# Patient Record
Sex: Female | Born: 1985 | Race: White | Hispanic: No | Marital: Married | Smoking: Never smoker
Health system: Southern US, Community
[De-identification: ages and names within clinical notes are randomized; demographics above are authoritative.]

## PROBLEM LIST (undated history)

## (undated) DIAGNOSIS — G43909 Migraine, unspecified, not intractable, without status migrainosus: Secondary | ICD-10-CM

## (undated) DIAGNOSIS — K623 Rectal prolapse: Secondary | ICD-10-CM

## (undated) DIAGNOSIS — F419 Anxiety disorder, unspecified: Secondary | ICD-10-CM

## (undated) DIAGNOSIS — F429 Obsessive-compulsive disorder, unspecified: Secondary | ICD-10-CM

## (undated) DIAGNOSIS — G510 Bell's palsy: Secondary | ICD-10-CM

## (undated) DIAGNOSIS — J189 Pneumonia, unspecified organism: Secondary | ICD-10-CM

## (undated) DIAGNOSIS — D649 Anemia, unspecified: Secondary | ICD-10-CM

## (undated) HISTORY — DX: Anemia, unspecified: D64.9

## (undated) HISTORY — DX: Rectal prolapse: K62.3

## (undated) HISTORY — DX: Migraine, unspecified, not intractable, without status migrainosus: G43.909

## (undated) HISTORY — DX: Obsessive-compulsive disorder, unspecified: F42.9

## (undated) HISTORY — DX: Pneumonia, unspecified organism: J18.9

## (undated) HISTORY — PX: BUNIONECTOMY: SHX129

## (undated) HISTORY — DX: Bell's palsy: G51.0

## (undated) HISTORY — DX: Anxiety disorder, unspecified: F41.9

## (undated) HISTORY — PX: NO PAST SURGERIES: SHX2092

---

## 2013-03-20 LAB — HM PAP SMEAR

## 2013-04-18 DIAGNOSIS — Z0000 Encounter for general adult medical examination without abnormal findings: Secondary | ICD-10-CM | POA: Insufficient documentation

## 2013-04-18 DIAGNOSIS — D509 Iron deficiency anemia, unspecified: Secondary | ICD-10-CM | POA: Insufficient documentation

## 2014-07-24 DIAGNOSIS — K623 Rectal prolapse: Secondary | ICD-10-CM | POA: Insufficient documentation

## 2016-04-27 DIAGNOSIS — M9901 Segmental and somatic dysfunction of cervical region: Secondary | ICD-10-CM | POA: Diagnosis not present

## 2016-04-27 DIAGNOSIS — M542 Cervicalgia: Secondary | ICD-10-CM | POA: Diagnosis not present

## 2016-04-27 DIAGNOSIS — M4005 Postural kyphosis, thoracolumbar region: Secondary | ICD-10-CM | POA: Diagnosis not present

## 2016-04-27 DIAGNOSIS — M9902 Segmental and somatic dysfunction of thoracic region: Secondary | ICD-10-CM | POA: Diagnosis not present

## 2016-04-29 DIAGNOSIS — F411 Generalized anxiety disorder: Secondary | ICD-10-CM | POA: Diagnosis not present

## 2016-05-09 DIAGNOSIS — M9901 Segmental and somatic dysfunction of cervical region: Secondary | ICD-10-CM | POA: Diagnosis not present

## 2016-05-09 DIAGNOSIS — M542 Cervicalgia: Secondary | ICD-10-CM | POA: Diagnosis not present

## 2016-05-09 DIAGNOSIS — M9902 Segmental and somatic dysfunction of thoracic region: Secondary | ICD-10-CM | POA: Diagnosis not present

## 2016-05-09 DIAGNOSIS — M4005 Postural kyphosis, thoracolumbar region: Secondary | ICD-10-CM | POA: Diagnosis not present

## 2016-05-10 DIAGNOSIS — F411 Generalized anxiety disorder: Secondary | ICD-10-CM | POA: Diagnosis not present

## 2016-05-10 DIAGNOSIS — M542 Cervicalgia: Secondary | ICD-10-CM | POA: Diagnosis not present

## 2016-05-10 DIAGNOSIS — M4005 Postural kyphosis, thoracolumbar region: Secondary | ICD-10-CM | POA: Diagnosis not present

## 2016-05-10 DIAGNOSIS — M9901 Segmental and somatic dysfunction of cervical region: Secondary | ICD-10-CM | POA: Diagnosis not present

## 2016-05-10 DIAGNOSIS — M9902 Segmental and somatic dysfunction of thoracic region: Secondary | ICD-10-CM | POA: Diagnosis not present

## 2016-05-12 DIAGNOSIS — M9902 Segmental and somatic dysfunction of thoracic region: Secondary | ICD-10-CM | POA: Diagnosis not present

## 2016-05-12 DIAGNOSIS — M4005 Postural kyphosis, thoracolumbar region: Secondary | ICD-10-CM | POA: Diagnosis not present

## 2016-05-12 DIAGNOSIS — M9901 Segmental and somatic dysfunction of cervical region: Secondary | ICD-10-CM | POA: Diagnosis not present

## 2016-05-12 DIAGNOSIS — M542 Cervicalgia: Secondary | ICD-10-CM | POA: Diagnosis not present

## 2016-05-17 DIAGNOSIS — M4005 Postural kyphosis, thoracolumbar region: Secondary | ICD-10-CM | POA: Diagnosis not present

## 2016-05-17 DIAGNOSIS — M9901 Segmental and somatic dysfunction of cervical region: Secondary | ICD-10-CM | POA: Diagnosis not present

## 2016-05-17 DIAGNOSIS — M9902 Segmental and somatic dysfunction of thoracic region: Secondary | ICD-10-CM | POA: Diagnosis not present

## 2016-05-17 DIAGNOSIS — M542 Cervicalgia: Secondary | ICD-10-CM | POA: Diagnosis not present

## 2016-05-18 DIAGNOSIS — M542 Cervicalgia: Secondary | ICD-10-CM | POA: Diagnosis not present

## 2016-05-18 DIAGNOSIS — M9901 Segmental and somatic dysfunction of cervical region: Secondary | ICD-10-CM | POA: Diagnosis not present

## 2016-05-18 DIAGNOSIS — M4005 Postural kyphosis, thoracolumbar region: Secondary | ICD-10-CM | POA: Diagnosis not present

## 2016-05-18 DIAGNOSIS — M9902 Segmental and somatic dysfunction of thoracic region: Secondary | ICD-10-CM | POA: Diagnosis not present

## 2016-05-19 DIAGNOSIS — M9902 Segmental and somatic dysfunction of thoracic region: Secondary | ICD-10-CM | POA: Diagnosis not present

## 2016-05-19 DIAGNOSIS — M9901 Segmental and somatic dysfunction of cervical region: Secondary | ICD-10-CM | POA: Diagnosis not present

## 2016-05-19 DIAGNOSIS — M542 Cervicalgia: Secondary | ICD-10-CM | POA: Diagnosis not present

## 2016-05-19 DIAGNOSIS — M4005 Postural kyphosis, thoracolumbar region: Secondary | ICD-10-CM | POA: Diagnosis not present

## 2016-05-22 DIAGNOSIS — R202 Paresthesia of skin: Secondary | ICD-10-CM | POA: Diagnosis not present

## 2016-05-22 DIAGNOSIS — F202 Catatonic schizophrenia: Secondary | ICD-10-CM | POA: Diagnosis not present

## 2016-05-22 DIAGNOSIS — G9389 Other specified disorders of brain: Secondary | ICD-10-CM | POA: Diagnosis not present

## 2016-05-22 DIAGNOSIS — G510 Bell's palsy: Secondary | ICD-10-CM | POA: Diagnosis not present

## 2016-05-22 DIAGNOSIS — D649 Anemia, unspecified: Secondary | ICD-10-CM | POA: Diagnosis not present

## 2016-05-22 LAB — HM HIV SCREENING LAB: HM HIV Screening: NEGATIVE

## 2016-05-23 DIAGNOSIS — F411 Generalized anxiety disorder: Secondary | ICD-10-CM | POA: Diagnosis not present

## 2016-05-26 DIAGNOSIS — B2701 Gammaherpesviral mononucleosis with polyneuropathy: Secondary | ICD-10-CM | POA: Diagnosis not present

## 2016-05-26 DIAGNOSIS — G510 Bell's palsy: Secondary | ICD-10-CM | POA: Diagnosis not present

## 2016-05-31 DIAGNOSIS — F419 Anxiety disorder, unspecified: Secondary | ICD-10-CM | POA: Diagnosis not present

## 2016-05-31 DIAGNOSIS — G510 Bell's palsy: Secondary | ICD-10-CM | POA: Diagnosis not present

## 2016-06-10 DIAGNOSIS — G510 Bell's palsy: Secondary | ICD-10-CM | POA: Diagnosis not present

## 2016-06-28 DIAGNOSIS — F411 Generalized anxiety disorder: Secondary | ICD-10-CM | POA: Diagnosis not present

## 2016-07-05 DIAGNOSIS — F411 Generalized anxiety disorder: Secondary | ICD-10-CM | POA: Diagnosis not present

## 2016-07-12 DIAGNOSIS — F411 Generalized anxiety disorder: Secondary | ICD-10-CM | POA: Diagnosis not present

## 2016-07-19 DIAGNOSIS — F411 Generalized anxiety disorder: Secondary | ICD-10-CM | POA: Diagnosis not present

## 2016-08-09 DIAGNOSIS — F411 Generalized anxiety disorder: Secondary | ICD-10-CM | POA: Diagnosis not present

## 2016-08-16 DIAGNOSIS — F411 Generalized anxiety disorder: Secondary | ICD-10-CM | POA: Diagnosis not present

## 2016-08-23 DIAGNOSIS — F411 Generalized anxiety disorder: Secondary | ICD-10-CM | POA: Diagnosis not present

## 2016-10-03 DIAGNOSIS — G510 Bell's palsy: Secondary | ICD-10-CM | POA: Diagnosis not present

## 2016-10-26 DIAGNOSIS — B0221 Postherpetic geniculate ganglionitis: Secondary | ICD-10-CM | POA: Insufficient documentation

## 2016-10-26 DIAGNOSIS — Z0000 Encounter for general adult medical examination without abnormal findings: Secondary | ICD-10-CM | POA: Diagnosis not present

## 2016-10-26 DIAGNOSIS — Z6825 Body mass index (BMI) 25.0-25.9, adult: Secondary | ICD-10-CM | POA: Diagnosis not present

## 2016-10-26 DIAGNOSIS — R5383 Other fatigue: Secondary | ICD-10-CM | POA: Diagnosis not present

## 2016-10-26 DIAGNOSIS — D509 Iron deficiency anemia, unspecified: Secondary | ICD-10-CM | POA: Diagnosis not present

## 2016-11-07 DIAGNOSIS — M542 Cervicalgia: Secondary | ICD-10-CM | POA: Diagnosis not present

## 2017-01-10 DIAGNOSIS — F422 Mixed obsessional thoughts and acts: Secondary | ICD-10-CM | POA: Diagnosis not present

## 2017-01-19 DIAGNOSIS — H04123 Dry eye syndrome of bilateral lacrimal glands: Secondary | ICD-10-CM | POA: Diagnosis not present

## 2017-01-24 DIAGNOSIS — H5319 Other subjective visual disturbances: Secondary | ICD-10-CM | POA: Diagnosis not present

## 2017-01-25 DIAGNOSIS — H53149 Visual discomfort, unspecified: Secondary | ICD-10-CM | POA: Diagnosis not present

## 2017-01-25 DIAGNOSIS — H5319 Other subjective visual disturbances: Secondary | ICD-10-CM | POA: Diagnosis not present

## 2017-01-25 DIAGNOSIS — H539 Unspecified visual disturbance: Secondary | ICD-10-CM | POA: Diagnosis not present

## 2017-01-26 DIAGNOSIS — H5340 Unspecified visual field defects: Secondary | ICD-10-CM | POA: Diagnosis not present

## 2017-01-26 DIAGNOSIS — G43119 Migraine with aura, intractable, without status migrainosus: Secondary | ICD-10-CM | POA: Diagnosis not present

## 2017-01-27 DIAGNOSIS — G35 Multiple sclerosis: Secondary | ICD-10-CM | POA: Diagnosis not present

## 2017-01-27 DIAGNOSIS — H5340 Unspecified visual field defects: Secondary | ICD-10-CM | POA: Diagnosis not present

## 2017-01-27 DIAGNOSIS — H547 Unspecified visual loss: Secondary | ICD-10-CM | POA: Diagnosis not present

## 2017-01-27 DIAGNOSIS — R253 Fasciculation: Secondary | ICD-10-CM | POA: Diagnosis not present

## 2017-01-27 DIAGNOSIS — G43119 Migraine with aura, intractable, without status migrainosus: Secondary | ICD-10-CM | POA: Diagnosis not present

## 2017-01-27 DIAGNOSIS — R200 Anesthesia of skin: Secondary | ICD-10-CM | POA: Diagnosis not present

## 2017-02-16 DIAGNOSIS — G43009 Migraine without aura, not intractable, without status migrainosus: Secondary | ICD-10-CM | POA: Diagnosis not present

## 2017-02-16 DIAGNOSIS — R5382 Chronic fatigue, unspecified: Secondary | ICD-10-CM | POA: Diagnosis not present

## 2017-02-16 DIAGNOSIS — R197 Diarrhea, unspecified: Secondary | ICD-10-CM | POA: Diagnosis not present

## 2017-02-16 DIAGNOSIS — R253 Fasciculation: Secondary | ICD-10-CM | POA: Diagnosis not present

## 2017-03-12 DIAGNOSIS — J189 Pneumonia, unspecified organism: Secondary | ICD-10-CM | POA: Diagnosis not present

## 2017-03-22 ENCOUNTER — Encounter: Payer: Self-pay | Admitting: Gastroenterology

## 2017-03-29 ENCOUNTER — Ambulatory Visit (INDEPENDENT_AMBULATORY_CARE_PROVIDER_SITE_OTHER): Payer: BLUE CROSS/BLUE SHIELD | Admitting: Physician Assistant

## 2017-03-29 ENCOUNTER — Encounter: Payer: Self-pay | Admitting: Neurology

## 2017-03-29 ENCOUNTER — Encounter: Payer: Self-pay | Admitting: Physician Assistant

## 2017-03-29 VITALS — BP 120/80 | HR 88 | Temp 98.9°F | Ht 61.5 in | Wt 141.0 lb

## 2017-03-29 DIAGNOSIS — F429 Obsessive-compulsive disorder, unspecified: Secondary | ICD-10-CM | POA: Insufficient documentation

## 2017-03-29 DIAGNOSIS — M62838 Other muscle spasm: Secondary | ICD-10-CM | POA: Diagnosis not present

## 2017-03-29 DIAGNOSIS — J189 Pneumonia, unspecified organism: Secondary | ICD-10-CM

## 2017-03-29 NOTE — Progress Notes (Signed)
Yolanda Kelly is a 31 y.o. female here to Establish Care and discuss muscle spasms, fatigue and insomnia.  I acted as a Neurosurgeonscribe for [employer] CorporationSamantha Manal Kreutzer, PA-C Yolanda Mullonna Orphanos, LPN  History of Present Illness:   Chief Complaint  Patient presents with  . Establish Care    BC/BS  . Spasms    Legs, arms  . Fatigue  . Insomnia   Yolanda Kelly is a 31 y/o female who presents to establish care. She reports that her PCP's office has closed and there are not providers there. She would like to discuss muscle spasms in her bilateral legs.  Acute Concerns: Muscle spasms of both lower extremities -- patient reports that she has had recent onset of several new symptoms. She was most recently evaluated on 02/16/17 by her PCP's office for this, Ms. Yolanda MeresKelly Phillips, [facility]-C [facility]. At that time record review reveals that she had described her symptoms as "internal vibrating in her entire body lasting 10 min in duration occurring 8-10 x per day." She confirms this is to me today and states it feels like "I'm going up on a roller coaster." She also endorses bilateral leg and hand cramping that has worsened with time, with intermittent numbness and tingling. She reports that about 2-3 years ago she had "undiagnosed mono" that they caught later and since that time she has had chronic fatigue. She has to take several naps throughout the day, even if she gets a full night of 8 hours. She doesn't endorse any anxiety or depression but does see a therapist regularly for OCD, for which she does not take any medication for.   Work-up at her June PCP office visit included: CBC w/ diff  --> WNL Lyme Ab/Western Blot Reflex --> negative [place] Spotted Fever, IgG --> negative EBV Acute Infection Abs --> past mono but no acute infection CMP --> WNL Vitamin B12 and Folate --> WNL Celiac Disease Panel --> negative  She has [facility] neurology in May 2018, seen by Yolanda Kelly, and was seen for peripheral field  deficit with eye pain and bitemporal pressure. She has a history of migraines. Evaluation for optic neuritis was performed and MRI was ordered to help evaluate MS and this was negative. She told me that she was then referred to a HA clinic and for a sleep test but she has yet to pursue this. She is interested in a second opinion of her symptoms.  Community Acquired PNA -- Was treated for PNA at an Urgent Care two weeks ago. Was diagnosed with CXR. I do not have these records to review. She feels much improved, was given a shot of rocephin and a z-pack, which she took as directed. Denies any SOB or fevers since completing abx course.  Weight -- Weight: 141 lb (64 kg)   [facility] 03/29/2017  Decreased Interest 0  Down, Depressed, Hopeless 0  PHQ - 2 Score 0    No flowsheet data found.  Other providers/specialists: GI -- appointment with Dr. Myrtie Neitheranis [facility] -- [facility] -- planning to be seen for her bunions   History reviewed. No pertinent past medical history.   Social History   Social History  . Marital status: Married    Spouse name: N/A  . Number of children: N/A  . Years of education: N/A   Occupational History  . Not on file.   Social History Main Topics  .  Smoking status: Never Smoker  . Smokeless tobacco: Never Used  . Alcohol use No  . Drug use: No  . Sexual activity: Yes    Birth control/ protection: IUD   Other Topics Concern  . Not on file   Social History Narrative   3 kids, ranging 3-8 y/o   Married almost 10 years   Don't work   Fun: chase kiddos, reading, hiking    History reviewed. No pertinent surgical history.  Family History  Problem Relation Age of Onset  . Heart disease Sister   . Mental illness Maternal Grandfather     Allergies  Allergen Reactions  . Other Anaphylaxis    Coke and red mt. dew     Current Medications:   Current Outpatient Prescriptions:  .   Ferrous Fumarate 63 (20 Fe) MG TABS, Take 1 tablet by mouth daily. , Disp: , Rfl:    Review of Systems:   Review of Systems  Constitutional: Positive for malaise/fatigue. Negative for chills, fever and weight loss.  HENT: Negative for sinus pain and sore throat.   Eyes: Negative for blurred vision.  Respiratory: Negative for cough and shortness of breath.   Cardiovascular: Negative for chest pain, palpitations and leg swelling.  Gastrointestinal: Negative for abdominal pain, constipation, diarrhea, heartburn, nausea and vomiting.  Genitourinary: Negative for dysuria, frequency and urgency.  Musculoskeletal: Negative for back pain, myalgias and neck pain.  Skin: Negative for itching and rash.  Neurological: Negative for dizziness, tingling, seizures, loss of consciousness and headaches.  Endo/Heme/Allergies: Negative for polydipsia.  Psychiatric/Behavioral: Negative for depression. The patient has insomnia. The patient is not nervous/anxious.     Vitals:   Vitals:   03/29/17 1028  BP: 120/80  Pulse: 88  Temp: 98.9 F (37.2 C)  TempSrc: Oral  SpO2: 98%  Weight: 141 lb (64 kg)  Height: 5' 1.5" (1.562 m)     Body mass index is 26.21 kg/m.  Physical Exam:   Physical Exam  Constitutional: She appears well-developed. She is cooperative.  Non-toxic appearance. She does not have a sickly appearance. She does not appear ill. No distress.  Cardiovascular: Normal rate, regular rhythm, S1 normal, S2 normal, normal heart sounds and normal pulses.   No LE edema  Pulmonary/Chest: Effort normal and breath sounds normal.  Neurological: She is alert. She has normal strength. No cranial nerve deficit or sensory deficit. Coordination and gait normal. GCS eye subscore is 4. GCS verbal subscore is 5. GCS motor subscore is 6.  Skin: Skin is warm, dry and intact.  Psychiatric: She has a normal mood and affect. Her speech is normal and behavior is normal.  Nursing note and vitals  reviewed.   Assessment and Plan:    Gilda was seen today for establish care, spasms, fatigue and insomnia.  Diagnoses and all orders for this visit:  Muscle spasms of both lower extremities No indication for additional labwork at this time. Patient is requesting a referral to second neurologist for a second opinion. She has been scheduled with Dr. Everlena Cooper on 04/03/17 -- appreciate coordination of care. Consider autoimmune work-up if negative work-up with neuro. -     Ambulatory referral to Neurology  Community acquired pneumonia, unspecified laterality Vitals stable today. Pulmonary exam benign today. Will repeat CXR in 2-4 weeks to ensure resolution of symptoms. Follow-up if symptoms return/worsen.   . Reviewed expectations re: course of current medical issues. . Discussed self-management of symptoms. . Outlined signs and symptoms indicating need for more acute intervention. Marland Kitchen  Patient verbalized understanding and all questions were answered. . See orders for this visit as documented in the electronic medical record. . Patient received an After-Visit Summary.  CMA or LPN served as scribe during this visit. History, Physical, and Plan performed by medical provider. Documentation and orders reviewed and attested to.  [name], PA-C

## 2017-03-29 NOTE — Patient Instructions (Signed)
It was great to meet you!  Please return in 2-4 weeks for repeat chest xray.  You will be contacted about your neurology referral.

## 2017-03-31 DIAGNOSIS — F422 Mixed obsessional thoughts and acts: Secondary | ICD-10-CM | POA: Diagnosis not present

## 2017-04-03 ENCOUNTER — Ambulatory Visit (INDEPENDENT_AMBULATORY_CARE_PROVIDER_SITE_OTHER): Payer: BLUE CROSS/BLUE SHIELD | Admitting: Neurology

## 2017-04-03 ENCOUNTER — Other Ambulatory Visit (INDEPENDENT_AMBULATORY_CARE_PROVIDER_SITE_OTHER): Payer: BLUE CROSS/BLUE SHIELD

## 2017-04-03 ENCOUNTER — Encounter: Payer: Self-pay | Admitting: Neurology

## 2017-04-03 VITALS — BP 120/80 | HR 92 | Ht 62.5 in | Wt 143.2 lb

## 2017-04-03 DIAGNOSIS — R202 Paresthesia of skin: Secondary | ICD-10-CM

## 2017-04-03 DIAGNOSIS — M62838 Other muscle spasm: Secondary | ICD-10-CM | POA: Diagnosis not present

## 2017-04-03 DIAGNOSIS — B0221 Postherpetic geniculate ganglionitis: Secondary | ICD-10-CM

## 2017-04-03 LAB — MAGNESIUM: Magnesium: 2.4 mg/dL (ref 1.5–2.5)

## 2017-04-03 LAB — CK: CK TOTAL: 77 U/L (ref 7–177)

## 2017-04-03 NOTE — Patient Instructions (Signed)
1.  Check Mg, CK 2.  Nerve study to evaluate paresthesia and muscle spasm/cramps 3.  Follow up afterwards.

## 2017-04-03 NOTE — Progress Notes (Signed)
NEUROLOGY CONSULTATION NOTE  Yolanda Kelly MRN: [id number] DOB: [birth year]  Referring provider: [name], PA Primary care provider: [name], PA  Reason for consult:  Muscle spasms, facial palsy  HISTORY OF PRESENT ILLNESS: Yolanda Kelly is a 31 year old right-handed female who presents for second opinion regarding several neurologic symptomatology.  History supplemented by prior neurologist, ED and PCP notes.  MRI brain report from September 2017 reviewed.  In September 2017, she developed right sided occipital headache that radiated to the ear.  She had associated fatigue, sore throat and lymphadenopathy.  Symptoms resolved with corticosteroids.  About 3 days later, she developed right sided facial weakness with associated right sided facial paresthesia, right ear pain and aural fullness with few erythematous vesicles in the right external auditory canal.  She was diagnosed with Bell's palsy.  A couple of days later, she developed left facial twitching.  She presented to the ED at [facility] on 05/22/16 and was noted to have complete right facial nerve palsy and mild left facial nerve palsy.  MRI of brain with and without contrast revealed no acute infarct or white matter changes, but did show subtle enhancement within the fundus of the right internal auditory canal, which may be seen with Bell's palsy.  She underwent lumbar puncture with CSF analysis demonstrating cell count of 4 (predominantly lymphs), glucose 74,  protein 28, gram stain and culture negative, VDRL negative, Lyme IgG/IgM negative and AFB negative.  Serum testing included CBC with WBC 16.5 (94.7% neutrophil), HGB 13.2, HCT 39.2, PLT 329; negative toxoplasma;, ANA negative; and HIV negative.  She had positive EBV antibodies, suggestive of acute infection.  She was discharged on prednisone and valacyclovir.  She subsequently followed up with outpatient neurology.  She returned to the ED on 05/31/16 due to onset  of left facial numbness and advised to continue her corticosteroids and antiviral medication.  She was evaluated by infectious disease, who believed that the Bell's palsy was likely secondary to herpes simplex or varicella rather than EBV.  Over the next 4 months, most of her symptoms resolved with only very subtle residual right sided facial weakness and synkinesis.  In May 2018, she developed eye discomfort and peripheral vision loss, followed by developed bitemporal pressure, which subsequently was associated with nausea.  She saw ophthalmology who dilated her eyes and she continued to have bilateral blurred vision since the visit.  She followed up with ophthalmology for re-evaluation.  Exam revealed mild inferonasal visual field defect, but otherwise normal exam with no evidence of optic neuritis.  She followed up with neurology who diagnosed her with intractable migraine, as she does have history of occasional migraine with visual aura.  She had another MRI of the brain with and without contrast on 01/30/17, which was normal.  Over the course of a month, the head pressure and visual disturbance resolved.  She subsequently developed fatigue.  She reported insomnia and diarrhea.  She has developed spasms and twitching in her legs and torso.  She also developed cramps in the calves.  Symptoms mostly are noticeable at night.  She also reports paresthesias in the fingers and toes.  She denies weakness.  Repeat EBV panel from 02/16/17 demonstrated negative EBV IgM and positive EBV IgG, suggesting prior but not acute infection.  Other labs were also unremarkable, including CBC, ANA, CMP, folate, B12 (596), RMSF IgG, and Lyme IgG/IgM.  About 2-3 weeks ago, she developed pneumonia, which has been treated.  PAST MEDICAL HISTORY: History  reviewed. No pertinent past medical history.  PAST SURGICAL HISTORY: History reviewed. No pertinent surgical history.  MEDICATIONS: Current Outpatient Prescriptions on File  Prior to Visit  Medication Sig Dispense Refill  . Ferrous Fumarate 63 (20 Fe) MG TABS Take 1 tablet by mouth daily.      No current facility-administered medications on file prior to visit.     ALLERGIES: Allergies  Allergen Reactions  . Other Anaphylaxis    Coke and red mt. dew    FAMILY HISTORY: Family History  Problem Relation Age of Onset  . Heart disease Sister   . Mental illness Maternal Grandfather     SOCIAL HISTORY: Social History   Social History  . Marital status: Married    Spouse name: N/A  . Number of children: 2  . Years of education: N/A   Occupational History  . Not on file.   Social History Main Topics  . Smoking status: Never Smoker  . Smokeless tobacco: Never Used  . Alcohol use No  . Drug use: No  . Sexual activity: Yes    Partners: Male    Birth control/ protection: IUD   Other Topics Concern  . 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, reading, hiking    REVIEW OF SYSTEMS: Constitutional: No fevers, chills, or sweats, no generalized fatigue, change in appetite Eyes: No visual changes, double vision, eye pain Ear, nose and throat: No hearing loss, ear pain, nasal congestion, sore throat Cardiovascular: No chest pain, palpitations Respiratory:  No shortness of breath at rest or with exertion, wheezes GastrointestinaI: No nausea, vomiting, diarrhea, abdominal pain, fecal incontinence Genitourinary:  No dysuria, urinary retention or frequency Musculoskeletal:  No neck pain, back pain Integumentary: No rash, pruritus, skin lesions Neurological: as above Psychiatric: No depression, insomnia, anxiety Endocrine: No palpitations, fatigue, diaphoresis, mood swings, change in appetite, change in weight, increased thirst Hematologic/Lymphatic:  No purpura, petechiae. Allergic/Immunologic: no itchy/runny eyes, nasal congestion, recent allergic reactions,  rashes  PHYSICAL EXAM: Vitals:   04/03/17 1502  BP: 120/80  Pulse: 92   General: No acute distress.  Patient appears well-groomed.  Head:  Normocephalic/atraumatic Eyes:  fundi examined but not visualized Neck: supple, no paraspinal tenderness, full range of motion Back: No paraspinal tenderness Heart: regular rate and rhythm Lungs: Clear to auscultation bilaterally. Vascular: No carotid bruits. Neurological Exam: Mental status: alert and oriented to person, place, and time, recent and remote memory intact, fund of knowledge intact, attention and concentration intact, speech fluent and not dysarthric, language intact. Cranial nerves: CN I: not tested CN II: pupils equal, round and reactive to light, visual fields intact CN III, IV, VI:  full range of motion, no nystagmus, no ptosis CN V: facial sensation intact CN VII: upper and lower face symmetric CN VIII: hearing intact CN IX, X: gag intact, uvula midline CN XI: sternocleidomastoid and trapezius muscles intact CN XII: tongue midline Bulk & Tone: normal, no fasciculations. Motor:  5/5 throughout  Sensation: temperature and vibration sensation intact. Deep Tendon Reflexes:  2+ throughout, toes downgoing.  Finger to nose testing:  Without dysmetria.  Heel to shin:  Without dysmetria.  Gait:  Normal station and stride.  Able to turn and tandem walk. Romberg negative.  IMPRESSION: 1.  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 2.  Current  muscle spasms/muscle cramps and paresthesias.  I suspect residual symptoms of a systemic viral illness. 3.  Incident in May may have been an intractable migraine, but it is unclear.  PLAN: 1.  Check Mg and CK to evaluate for causes of muscle cramps/spasms 2.  Check NCV-EMG to evaluate paresthesias and muscle cramps/spasms 3.  Recommend that she follow up with ophthalmology again for recheck of visual field.    4.  Follow up after testing.  Thank you for allowing me to take part in the care of this patient.  Shon Millet, DO  CC:  [name], PA

## 2017-04-04 ENCOUNTER — Telehealth: Payer: Self-pay | Admitting: Neurology

## 2017-04-04 NOTE — Telephone Encounter (Signed)
Mychart message sent to patient.

## 2017-04-04 NOTE — Telephone Encounter (Signed)
-----   Message from Drema DallasAdam R Jaffe, DO sent at 04/04/2017  6:41 AM EDT ----- Labs are normal

## 2017-04-06 ENCOUNTER — Ambulatory Visit (INDEPENDENT_AMBULATORY_CARE_PROVIDER_SITE_OTHER): Payer: BLUE CROSS/BLUE SHIELD | Admitting: Neurology

## 2017-04-06 DIAGNOSIS — B0221 Postherpetic geniculate ganglionitis: Secondary | ICD-10-CM | POA: Diagnosis not present

## 2017-04-06 DIAGNOSIS — M62838 Other muscle spasm: Secondary | ICD-10-CM

## 2017-04-06 DIAGNOSIS — H5319 Other subjective visual disturbances: Secondary | ICD-10-CM | POA: Diagnosis not present

## 2017-04-06 DIAGNOSIS — R202 Paresthesia of skin: Secondary | ICD-10-CM

## 2017-04-07 ENCOUNTER — Telehealth: Payer: Self-pay | Admitting: Neurology

## 2017-04-07 ENCOUNTER — Emergency Department (HOSPITAL_COMMUNITY)
Admission: EM | Admit: 2017-04-07 | Discharge: 2017-04-08 | Disposition: A | Discharge status: Home / Self Care | Payer: BLUE CROSS/BLUE SHIELD | Attending: Emergency Medicine | Admitting: Emergency Medicine

## 2017-04-07 ENCOUNTER — Encounter (HOSPITAL_COMMUNITY): Payer: Self-pay

## 2017-04-07 DIAGNOSIS — M79671 Pain in right foot: Secondary | ICD-10-CM

## 2017-04-07 DIAGNOSIS — R2241 Localized swelling, mass and lump, right lower limb: Secondary | ICD-10-CM | POA: Diagnosis not present

## 2017-04-07 DIAGNOSIS — M7989 Other specified soft tissue disorders: Secondary | ICD-10-CM

## 2017-04-07 DIAGNOSIS — Z79899 Other long term (current) drug therapy: Secondary | ICD-10-CM | POA: Diagnosis not present

## 2017-04-07 NOTE — Telephone Encounter (Signed)
-----   Message from Drema DallasAdam R Jaffe, DO sent at 04/07/2017 11:40 AM EDT ----- Nerve study is normal.  It shows no evidence of muscle or nerve disease.

## 2017-04-07 NOTE — ED Triage Notes (Signed)
Pt c/o R ankle and foot swelling following an electromyelogram yesterday. Swelling not visualized in triage. Pt reports that she is also experiencing a shooting pain up her R leg, but that is normal for her. A&Ox4. Ambulatory.

## 2017-04-07 NOTE — Telephone Encounter (Signed)
Patient made aware.

## 2017-04-07 NOTE — Discharge Instructions (Signed)
Return here for any worsening in your condition.  Follow-up with your primary doctor, elevate your foot

## 2017-04-07 NOTE — Procedures (Signed)
[facility]  [address], [address]  [place], [place] [address] Tel: [phone] Fax:  [phone] Test Date:  04/06/2017  Patient: Yolanda HindHaley Kelly DOB: [birth year] Physician: Nita Sickleonika Dashae Wilcher, DO  Sex: Female Height: 5\' 2"  Ref Phys: Shon MilletAdam Jaffe, D.O.  ID#: [id number] Temp: 34.0C Technician:    Patient Complaints: This is a 31 year-old female referred for evaluation of muscle cramps, spasms, and paresthesias following viral illness.  NCV & EMG Findings: Extensive electrodiagnostic testing of the right upper extremity and lower extremity shows:  1. All sensory responses including the right median, ulnar, mixed palmer, sural, and superficial peroneal sensory responses are within normal limits. 2. 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 3. There is no evidence of active or chronic motor axon loss changes affecting any of the tested muscles. Motor unit configuration and recruitment pattern is within normal limits.  Impression: This is a normal study of the right upper and lower extremity. In particular, there is no evidence of carpal tunnel syndrome, sensorimotor polyneuropathy, or cervical/lumbosacral radiculopathy.  Incidentally, there is a right proximal Martin-Gruber anastomosis, a normal variant.   ___________________________ Nita Sickleonika Nicklaus Alviar, DO    Nerve Conduction Studies Anti Sensory Summary Table   Site NR Peak (ms) Norm Peak (ms) P-T Amp (V) Norm P-T Amp  Right Median Anti Sensory (2nd Digit)  34C  Wrist    2.6 <3.4 53.9 >20  Right Sup Peroneal Anti Sensory (Ant Lat Mall)  34C  12 cm    2.4 <4.5 30.4 >5  Right Sural Anti Sensory (Lat Mall)  34C  Calf    3.4 <4.5 15.0 >5  Right Ulnar Anti Sensory (5th Digit)  34C  Wrist     2.3 <3.1 44.0 >12   Motor Summary Table   Site NR Onset (ms) Norm Onset (ms) O-P Amp (mV) Norm O-P Amp Site1 Site2 Delta-0 (ms) Dist (cm) Vel (m/s) Norm Vel (m/s)  Right Median Motor (Abd Poll Brev)  34C  Wrist    2.8 <3.9 5.8 >6 Elbow Wrist 4.0 27.0 68 >50  Elbow    6.8  5.2  Ulnar-wrist crossover Elbow 3.2 0.0    Ulnar-wrist crossover    3.6  8.9  Ulnar- A-e6low crossover Ulnar-wrist crossover 4.4 0.0    Ulnar- A-e6low crossover    8.0  9.7         Right Peroneal Motor (Ext Dig Brev)  34C  Ankle    2.8 <5.5 6.7 >3 B Fib Ankle 6.0 33.0 55 >40  B Fib    8.8  6.3  Poplt B Fib 1.5 10.0 67 >40  Poplt    10.3  6.2         Right Tibial Motor (Abd Hall Brev)  34C  Ankle    2.7 <6.0 18.9 >8 Knee Ankle 6.8 36.0 53 >40  Knee    9.5  14.5         Right Ulnar Motor (Abd Dig Minimi)  34C  Wrist    2.0 <3.1 10.7 >7 B Elbow Wrist 2.6 20.0 77 >50  B Elbow    4.6  10.4  A Elbow B Elbow 1.6 10.0 62 >50  A Elbow    6.2  10.1          Comparison Summary  Table   Site NR Peak (ms) Norm Peak (ms) P-T Amp (V) Site1 Site2 Delta-P (ms) Norm Delta (ms)  Right Median/Ulnar Palm Comparison (Wrist - 8cm)  34C  Median Palm    1.9 <2.2 58.3 Median Palm Ulnar Palm 0.1   Ulnar Palm    1.8 <2.2 14.9       H Reflex Studies   NR H-Lat (ms) Lat Norm (ms) L-R H-Lat (ms)  Right Tibial (Gastroc)  34C     27.62 <35    EMG   Side Muscle Ins Act Fibs Psw Fasc Number Recrt Dur Dur. Amp Amp. Poly Poly. Comment  Right 1stDorInt Nml Nml Nml Nml Nml Nml Nml Nml Nml Nml Nml Nml N/A  Right Ext Indicis Nml Nml Nml Nml Nml Nml Nml Nml Nml Nml Nml Nml N/A  Right PronatorTeres Nml Nml Nml Nml Nml Nml Nml Nml Nml Nml Nml Nml N/A  Right Biceps Nml Nml Nml Nml Nml Nml Nml Nml Nml Nml Nml Nml N/A  Right Triceps Nml Nml Nml Nml Nml Nml Nml Nml Nml Nml Nml Nml N/A  Right Deltoid Nml Nml Nml Nml Nml Nml Nml Nml Nml Nml Nml Nml N/A  Right AntTibialis Nml Nml Nml Nml Nml Nml Nml Nml Nml Nml Nml Nml N/A  Right Gastroc Nml Nml  Nml Nml Nml Nml Nml Nml Nml Nml Nml Nml N/A  Right Flex Dig Long Nml Nml Nml Nml Nml Nml Nml Nml Nml Nml Nml Nml N/A  Right RectFemoris Nml Nml Nml Nml Nml Nml Nml Nml Nml Nml Nml Nml N/A  Right GluteusMed Nml Nml Nml Nml Nml Nml Nml Nml Nml Nml Nml Nml N/A      Waveforms:

## 2017-04-11 ENCOUNTER — Ambulatory Visit: Payer: Self-pay | Admitting: Podiatry

## 2017-04-13 DIAGNOSIS — Z6825 Body mass index (BMI) 25.0-25.9, adult: Secondary | ICD-10-CM | POA: Diagnosis not present

## 2017-04-13 DIAGNOSIS — Z01419 Encounter for gynecological examination (general) (routine) without abnormal findings: Secondary | ICD-10-CM | POA: Diagnosis not present

## 2017-04-18 ENCOUNTER — Ambulatory Visit: Payer: BLUE CROSS/BLUE SHIELD | Admitting: Neurology

## 2017-04-18 NOTE — ED Provider Notes (Signed)
[facility] DEPT Provider Note   CSN: [id number] Arrival date & time: 04/07/17  2156     History   Chief Complaint Chief Complaint  Patient presents with  . Foot Pain    HPI Yolanda Kelly is a 31 y.o. female.  HPI Patient presents to the emergency department with foot swelling that started in her right foot earlier this evening.  Patient states she felt pain in the area temp swelling.  She states that she had an electromyelogram yesterday in the same leg.  She states that she has since noted swelling.  Has disappeared.  The patient states that she did apply ice before arrival to the area along with elevation.  She states that nothing seems make the condition better or worse.  She states she is currently dealing with some unknown neurological disturbance that is causing issues with her legs History reviewed. No pertinent past medical history.  Patient Active Problem List   Diagnosis Date Noted  . OCD (obsessive compulsive disorder) 03/29/2017  . Migraine without aura and without status migrainosus, not intractable 02/16/2017  . 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 Rectal mucosa prolapse 07/24/2014  . Iron deficiency anemia 04/18/2013    History reviewed. No pertinent surgical history.  OB History    No data available       Home Medications    Prior to Admission medications   Medication Sig Start Date End Date Taking? Authorizing Provider  Ferrous Fumarate 63 (20 Fe) MG TABS Take 1 tablet by mouth daily.     [provider]    Family History Family History  Problem Relation Age of Onset  . Heart disease Sister   . Mental illness Maternal Grandfather     Social History Social History  Substance Use Topics  . Smoking status: Never Smoker  . Smokeless tobacco: Never Used  . Alcohol use No     Allergies   Other   Review of Systems Review of Systems All other systems negative except as documented in the HPI. All pertinent positives and  negatives as reviewed in the HPI.  Physical Exam Updated Vital Signs BP 126/76 (BP Location: Right Arm)   Pulse 65   Temp 98.4 F (36.9 C) (Oral)   Resp 18   Ht 5\' 1"  (1.549 m)   Wt 63.5 kg (140 lb)   LMP 03/14/2017   SpO2 100%   BMI 26.45 kg/m   Physical Exam  Constitutional: She is oriented to person, place, and time. She appears well-developed and well-nourished. No distress.  HENT:  Head: Normocephalic and atraumatic.  Eyes: Pupils are equal, round, and reactive to light.  Pulmonary/Chest: Effort normal.  Musculoskeletal:       Feet:  Neurological: She is alert and oriented to person, place, and time.  Skin: Skin is warm and dry.  Psychiatric: She has a normal mood and affect.  Nursing note and vitals reviewed.    ED Treatments / Results  Labs (all labs ordered are listed, but only abnormal results are displayed) Labs Reviewed - No data to display  EKG  EKG Interpretation None       Radiology No results found.  Procedures Procedures (including critical care time)  Medications Ordered in ED Medications - No data to display   Initial Impression / Assessment and Plan / ED Course  I have reviewed the triage vital signs and the nursing notes.  Pertinent labs & imaging results that were available during my care of the patient were  reviewed by me and considered in my medical decision making (see chart for details).    I advised the patient that I cannot totally exclude blood clot, but at this point, I feel this is less likely cause.  Patient agrees and would like to forego the venous Doppler study.  She states that she will follow-up with her primary doctor  Final Clinical Impressions(s) / ED Diagnoses   Final diagnoses:  Foot pain, right  Foot swelling    New Prescriptions Discharge Medication List as of 04/07/2017 11:36 PM       Charlestine Night, PA-C 04/18/17 2313    Tilden Fossa, MD 04/20/17 [phone]

## 2017-04-26 ENCOUNTER — Encounter: Payer: Self-pay | Admitting: Physician Assistant

## 2017-04-26 ENCOUNTER — Ambulatory Visit (INDEPENDENT_AMBULATORY_CARE_PROVIDER_SITE_OTHER): Payer: BLUE CROSS/BLUE SHIELD

## 2017-04-26 ENCOUNTER — Ambulatory Visit (INDEPENDENT_AMBULATORY_CARE_PROVIDER_SITE_OTHER): Payer: BLUE CROSS/BLUE SHIELD | Admitting: Physician Assistant

## 2017-04-26 VITALS — BP 114/80 | HR 75 | Temp 99.6°F | Ht 61.0 in | Wt 143.2 lb

## 2017-04-26 DIAGNOSIS — J189 Pneumonia, unspecified organism: Secondary | ICD-10-CM | POA: Diagnosis not present

## 2017-04-26 DIAGNOSIS — M62838 Other muscle spasm: Secondary | ICD-10-CM

## 2017-04-26 NOTE — Progress Notes (Signed)
Yolanda Kelly is a 31 y.o. female is here for follow up on Pneumonia.  I acted as a Neurosurgeon for [name], PA-C Yolanda Mull, LPN  History of Present Illness:   Chief Complaint  Patient presents with  . Follow-up  . Pneumonia    Chest x-ray done today  . Spasms    Pneumonia Pt here to today to follow up on Pneumonia, symptoms resolved. Pt feels better. No SOB, fever, cough.  Muscle Spasms Pt also following up on muscle spasms, they are not as intense, but are lasting longer, is having shooting pains in bilateral lower legs and feet. Twitching is better. Ringing in ears and pressure in ears. Having sporatic areas of swelling on body lasting 1/2 hour to 1 hour.  She saw Dr. Everlena Kelly for her muscle spasms on 04/03/17. Mag and CK were normal. EMG study was normal. His ddx includes prior right-sided Ramsay Hunt Syndrome to attribute her facial symptoms as well as muscle spasms possibly from residual symptoms of a systemic viral illness. She is following up with Dr. Everlena Kelly on the 27th. She did go to opthamalogy, and said that her L eye has improvement with the visual fields but not completely resolved -- I do not have these records to review, but she thinks that they were sent to Dr. Moises Kelly office. She reports that her spasms are less frequent and intense but do last for longer amounts of time. Random swelling has occurred above her eyebrow, on lower legs, feet. Lasts for about 1 hour and goes away on own. Fingers and feet have been randomly jerking and twitching.   Has increased ringing in ears. Notices more when laying down and going to sleep. No recent travel in higher altitude or elevation. Since the Ramsay Hunt, she has had these issues. Has not seen an ENT.  She has been intentionally trying to avoid [employer] to look up her symptoms.   Health Maintenance Due  Topic Date Due  . HIV Screening  10/23/1998  . PAP SMEAR  10/23/2004  . INFLUENZA VACCINE  04/12/2017    No past medical  history on file.   Social History   Social History  . Marital status: Married    Spouse name: N/A  . Number of children: 2  . Years of education: N/A   Occupational History  . Not on file.   Social History Main Topics  . Smoking status: Never Smoker  . Smokeless tobacco: Never Used  . Alcohol use No  . Drug use: No  . Sexual activity: Yes    Partners: Male    Birth control/ protection: IUD   Other Topics Concern  . Not on file   Social History Narrative   3 kids, ranging 3-8 y/o   Married almost 10 years   Doesn't work   Fun: [employer], reading, hiking    No past surgical history on file.  Family History  Problem Relation Age of Onset  . Heart disease Sister   . Mental illness Maternal Grandfather     PMHx, SurgHx, SocialHx, FamHx, Medications, and Allergies were reviewed in the Visit Navigator and updated as appropriate.   Patient Active Problem List   Diagnosis Date Noted  . OCD (obsessive compulsive disorder) 03/29/2017  . Migraine without aura and without status migrainosus, not intractable 02/16/2017  . 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 Rectal mucosa prolapse 07/24/2014  . Iron deficiency anemia 04/18/2013    Social History  Substance Use Topics  .  Smoking status: Never Smoker  . Smokeless tobacco: Never Used  . Alcohol use No    Current Medications and Allergies:    Current Outpatient Prescriptions:  .  Ferrous Fumarate 63 (20 Fe) MG TABS, Take 1 tablet by mouth daily. , Disp: , Rfl:    Allergies  Allergen Reactions  . Other Anaphylaxis    Coke and red mt. dew    Review of Systems   Review of Systems  Constitutional: Negative for chills, fever, malaise/fatigue and weight loss.  Respiratory: Negative for cough and shortness of breath.   Cardiovascular: Negative for chest pain, palpitations and claudication.  Gastrointestinal: Negative for abdominal pain, diarrhea, heartburn, nausea and vomiting.  Neurological: Positive  for tingling and sensory change. Negative for dizziness, tremors, focal weakness, seizures and headaches.  Psychiatric/Behavioral: Negative for depression. The patient is not nervous/anxious.     Vitals:   Vitals:   04/26/17 1003  BP: 114/80  Pulse: 75  Temp: 99.6 F (37.6 C)  TempSrc: Oral  SpO2: 99%  Weight: 143 lb 4 oz (65 kg)  Height: 5\' 1"  (1.549 m)     Body mass index is 27.07 kg/m.   Physical Exam:    Physical Exam  Constitutional: She appears well-developed. She is cooperative.  Non-toxic appearance. She does not have a sickly appearance. She does not appear ill. No distress.  Cardiovascular: Normal rate, regular rhythm, S1 normal, S2 normal, normal heart sounds and normal pulses.   No LE edema  Pulmonary/Chest: Effort normal and breath sounds normal.  Neurological: She is alert. She has normal strength. No cranial nerve deficit or sensory deficit. GCS eye subscore is 4. GCS verbal subscore is 5. GCS motor subscore is 6.  Reflex Scores:      Patellar reflexes are 2+ on the right side and 2+ on the left side. Skin: Skin is warm, dry and intact.  Psychiatric: She has a normal mood and affect. Her speech is normal and behavior is normal.  Nursing note and vitals reviewed.  CLINICAL DATA:  Follow-up for pneumonia after treatment.  EXAM: CHEST  2 VIEW  COMPARISON:  None.  FINDINGS: The heart size and mediastinal contours are within normal limits. Both lungs are clear. The visualized skeletal structures are unremarkable.  IMPRESSION: Normal chest x-ray.   Assessment and Plan:    Aurorah was seen today for follow-up, pneumonia and spasms.  Diagnoses and all orders for this visit:  Community acquired pneumonia, unspecified laterality Follow-up chest xray without abnormalities, patient is asymptomatic. Follow-up if symptoms return.  Muscle spasms of both lower extremities Reviewed plan of care with patient per Dr. Moises Kelly most recent note. She is pleased  with his care and has a follow-up with him in a few weeks. I do not see any need for labs or other diagnostics at this time. We briefly talked about doing a rheumatology work-up if she would like a second opinion, and I encouraged her to discuss this with Dr. Everlena Kelly about this if she is interested. Follow-up with [place] or Dr. Everlena Kelly if symptoms change or other concerns.  . Reviewed expectations re: course of current medical issues. . Discussed self-management of symptoms. . Outlined signs and symptoms indicating need for more acute intervention. . Patient verbalized understanding and all questions were answered. . See orders for this visit as documented in the electronic medical record. . Patient received an After Visit Summary.  CMA or LPN served as scribe during this visit. History, Physical, and Plan performed by medical  provider. Documentation and orders reviewed and attested to.  Jarold MottoSamantha Rey Fors, PA-C [place], [place] 04/26/2017  Follow-up: No Follow-up on file.

## 2017-05-08 ENCOUNTER — Encounter: Payer: Self-pay | Admitting: Neurology

## 2017-05-08 ENCOUNTER — Ambulatory Visit (INDEPENDENT_AMBULATORY_CARE_PROVIDER_SITE_OTHER): Payer: BLUE CROSS/BLUE SHIELD | Admitting: Neurology

## 2017-05-08 VITALS — BP 106/70 | HR 107 | Ht 61.5 in | Wt 147.3 lb

## 2017-05-08 DIAGNOSIS — M62838 Other muscle spasm: Secondary | ICD-10-CM | POA: Diagnosis not present

## 2017-05-08 DIAGNOSIS — R202 Paresthesia of skin: Secondary | ICD-10-CM

## 2017-05-08 DIAGNOSIS — B0221 Postherpetic geniculate ganglionitis: Secondary | ICD-10-CM | POA: Diagnosis not present

## 2017-05-08 NOTE — Patient Instructions (Signed)
Your neurologic exam and testing is normal, which is good.

## 2017-05-08 NOTE — Progress Notes (Signed)
NEUROLOGY FOLLOW UP OFFICE NOTE  Yolanda Kelly [id number]  HISTORY OF PRESENT ILLNESS: Yolanda Kelly is a 31 year old female who follows up for muscle twitches, cramps, and paresthesias.Marland Kitchen    UPDATE: To evaluate muscle cramps and paresthesias, she underwent NCV-EMG on 04/06/17, which was normal.  To evaluate muscle cramps, CK and Mg were checked on 04/03/17, which were normal.  She reports the ear pressure and tinnitus has improved and that the cramps have become less intense.  However, she still has muscle twitches and involuntary movements.  Sometimes, she reports that a certain part of a limb will swell.  For the past few weeks, she reports back and neck pain.  Recent repeat eye exam was unremarkable.   HISTORY: In September 2017, she developed right sided occipital headache that radiated to the ear.  She had associated fatigue, sore throat and lymphadenopathy.  Symptoms resolved with corticosteroids.  About 3 days later, she developed right sided facial weakness with associated right sided facial paresthesia, right ear pain and aural fullness with few erythematous vesicles in the right external auditory canal.  She was diagnosed with Bell's palsy.  A couple of days later, she developed left facial twitching.  She presented to the ED at [facility] on 05/22/16 and was noted to have complete right facial nerve palsy and mild left facial nerve palsy.  MRI of brain with and without contrast revealed no acute infarct or white matter changes, but did show subtle enhancement within the fundus of the right internal auditory canal, which may be seen with Bell's palsy.  She underwent lumbar puncture with CSF analysis demonstrating cell count of 4 (predominantly lymphs), glucose 74,  protein 28, gram stain and culture negative, VDRL negative, Lyme IgG/IgM negative and AFB negative.  Serum testing included CBC with WBC 16.5 (94.7% neutrophil), HGB 13.2, HCT 39.2, PLT 329; negative toxoplasma;, ANA  negative; and HIV negative.  She had positive EBV antibodies, suggestive of acute infection.  She was discharged on prednisone and valacyclovir.  She subsequently followed up with outpatient neurology.  She returned to the ED on 05/31/16 due to onset of left facial numbness and advised to continue her corticosteroids and antiviral medication.  She was evaluated by infectious disease, who believed that the Bell's palsy was likely secondary to herpes simplex or varicella rather than EBV.  Over the next 4 months, most of her symptoms resolved with only very subtle residual right sided facial weakness and synkinesis.   In May 2018, she developed eye discomfort and peripheral vision loss, followed by developed bitemporal pressure, which subsequently was associated with nausea.  She saw ophthalmology who dilated her eyes and she continued to have bilateral blurred vision since the visit.  She followed up with ophthalmology for re-evaluation.  Exam revealed mild inferonasal visual field defect, but otherwise normal exam with no evidence of optic neuritis.  She followed up with neurology who diagnosed her with intractable migraine, as she does have history of occasional migraine with visual aura.  She had another MRI of the brain with and without contrast on 01/30/17, which was normal.  Over the course of a month, the head pressure and visual disturbance resolved.   She subsequently developed fatigue.  She reported insomnia and diarrhea.  She has developed spasms and twitching in her legs and torso.  She also developed cramps in the calves.  Symptoms mostly are noticeable at night.  She also reports paresthesias in the fingers and toes.  She denies  weakness.  Repeat EBV panel from 02/16/17 demonstrated negative EBV IgM and positive EBV IgG, suggesting prior but not acute infection.  Other labs were also unremarkable, including CBC, ANA, CMP, folate, B12 (596), RMSF IgG, and Lyme IgG/IgM.  PAST MEDICAL HISTORY: Past  Medical History:  Diagnosis Date  . Anxiety   . Bell's palsy   . Migraines     MEDICATIONS: Current Outpatient Prescriptions on File Prior to Visit  Medication Sig Dispense Refill  . Ferrous Fumarate 63 (20 Fe) MG TABS Take 1 tablet by mouth daily.      No current facility-administered medications on file prior to visit.     ALLERGIES: Allergies  Allergen Reactions  . Other Anaphylaxis    Coke and red mt. dew    FAMILY HISTORY: Family History  Problem Relation Age of Onset  . Thyroid disease Mother   . Asthma Father   . Heart disease Sister   . Diabetes Mellitus II Maternal Grandmother   . Mental illness Maternal Grandfather   . Glaucoma Paternal Grandfather   . Celiac disease Sister   . Celiac disease Son   . Breast cancer Maternal Aunt     SOCIAL HISTORY: Social History   Social History  . Marital status: Married    Spouse name: N/A  . Number of children: 2  . Years of education: N/A   Occupational History  . Not on file.   Social History Main Topics  . Smoking status: Never Smoker  . Smokeless tobacco: Never Used  . Alcohol use No  . Drug use: No  . Sexual activity: Yes    Partners: Male    Birth control/ protection: IUD   Other Topics Concern  . 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[employer], reading, hiking    REVIEW OF SYSTEMS: Constitutional: No fevers, chills, or sweats, no generalized fatigue, change in appetite Eyes: No visual changes, double vision, eye pain Ear, nose and throat: No hearing loss, ear pain, nasal congestion, sore throat Cardiovascular: No chest pain, palpitations Respiratory:  No shortness of breath at rest or with exertion, wheezes GastrointestinaI: No nausea, vomiting, diarrhea, abdominal pain, fecal incontinence Genitourinary:  No dysuria, urinary retention or frequency Musculoskeletal:  No neck pain, back pain Integumentary: No rash,  pruritus, skin lesions Neurological: as above Psychiatric: No depression, insomnia, anxiety Endocrine: No palpitations, fatigue, diaphoresis, mood swings, change in appetite, change in weight, increased thirst Hematologic/Lymphatic:  No purpura, petechiae. Allergic/Immunologic: no itchy/runny eyes, nasal congestion, recent allergic reactions, rashes  PHYSICAL EXAM: BP 106/70, Pulse 107, Wt 147 lb, HT 5' 1.5" General: No acute distress.  Patient appears well-groomed.  Head:  Normocephalic/atraumatic Eyes:  Fundi examined but not visualized Neck: supple, no paraspinal tenderness, full range of motion Heart:  Regular rate and rhythm Lungs:  Clear to auscultation bilaterally Back: No paraspinal tenderness Neurological Exam: alert and oriented to person, place, and time. Attention span and concentration intact, recent and remote memory intact, fund of knowledge intact.  Speech fluent and not dysarthric, language intact.  CN II-XII intact. Bulk and tone normal, muscle strength 5/5 throughout.  Sensation to light touch, temperature and vibration intact.  Deep tendon reflexes 2+ throughout, toes downgoing.  Finger to nose and heel to shin testing intact.  Gait normal, Romberg negative.  IMPRESSION: 1.  Ramsay Hunt Syndrome, resolved 2.  Muscle cramps, paresthesias and muscle  twitches.  Uncertain etiology.  I do not believe it is neurologic.  Neurologic exam and testing is unremarkable.  PLAN: No further workup warranted.  15 minutes spent face to face with patient, over 50% spent discussing results and diagnosis.  Shon Millet, DO  CC:  [name], PA

## 2017-05-09 ENCOUNTER — Ambulatory Visit (INDEPENDENT_AMBULATORY_CARE_PROVIDER_SITE_OTHER): Payer: BLUE CROSS/BLUE SHIELD | Admitting: Gastroenterology

## 2017-05-09 ENCOUNTER — Encounter: Payer: Self-pay | Admitting: Gastroenterology

## 2017-05-09 VITALS — BP 100/70 | HR 68 | Ht 61.0 in | Wt 148.0 lb

## 2017-05-09 DIAGNOSIS — R197 Diarrhea, unspecified: Secondary | ICD-10-CM | POA: Diagnosis not present

## 2017-05-09 DIAGNOSIS — R1033 Periumbilical pain: Secondary | ICD-10-CM

## 2017-05-09 NOTE — Patient Instructions (Signed)
If you are age 33 or older, your body mass index should be between 23-30. Your Body mass index is 27.96 kg/m. If this is out of the aforementioned range listed, please consider follow up with your Primary Care Provider.  If you are age 26 or younger, your body mass index should be between 19-25. Your Body mass index is 27.96 kg/m. If this is out of the aformentioned range listed, please consider follow up with your Primary Care Provider.   Follow up as needed.  Thank you for choosing [facility]  Dr Amada Jupiter III

## 2017-05-09 NOTE — Progress Notes (Signed)
[facility] Consult Note:  History: Yolanda Kelly 05/09/2017  Referring physician: [name], PA  Reason for consult/chief complaint: Diarrhea (onset x several months, usually every other day, start some blood at the onset)   Subjective  HPI:  This is a 31 year old woman referred by primary care noted above for abdominal cramps and loose stool. About 6 months ago she developed Ramsay Hunt syndrome, and since then has had a constellation of symptoms without a clear diagnosis. He has had some muscle fasciculations and pain as well as intermittent numbness. She has had what sounds again extensive neurologic workup that has been unrevealing including lab work and MRI of the brain. During that time she developed some intermittent cramps and diarrhea. It has gradually improved to the point that she has manageable symptoms. She tested negative for celiac sprue along with other extensive lab work. Sometimes she will have a regular formed stool in the morning, then some urgency and a loose stool later in the day. There is never been rectal bleeding, her appetite is good and her weight stable. She denies upper GI symptoms.   ROS:  Review of Systems  Constitutional: Negative for appetite change and unexpected weight change.  HENT: Negative for mouth sores and voice change.   Eyes: Negative for pain and redness.  Respiratory: Negative for cough and shortness of breath.   Cardiovascular: Negative for chest pain and palpitations.  Genitourinary: Negative for dysuria and hematuria.  Musculoskeletal: Negative for arthralgias and myalgias.  Skin: Negative for pallor and rash.  Neurological: Negative for weakness and headaches.  Hematological: Negative for adenopathy.     Past Medical History: Past Medical History:  Diagnosis Date  . Anemia   . Anxiety   . Bell's palsy   . Migraines   . OCD (obsessive compulsive disorder)   . Pneumonia 03/2017  . Rectal prolapse        Past Surgical History: Past Surgical History:  Procedure Laterality Date  . NO PAST SURGERIES       Family History: Family History  Problem Relation Age of Onset  . Thyroid disease Mother   . Kidney cancer Mother   . Asthma Father   . Irritable bowel syndrome Father   . Heart disease Sister   . Diabetes Mellitus II Maternal Grandmother   . Mental illness Maternal Grandfather   . Glaucoma Paternal Grandfather   . Celiac disease Sister   . Celiac disease Son        tests high for it, not 100% sure he has it  . Breast cancer Maternal Aunt   . Colon cancer Neg Hx   . Throat cancer Neg Hx   . Rectal cancer Neg Hx     Social History: Social History   Social History  . Marital status: Married    Spouse name: N/A  . Number of children: 3  . 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 Smoking status: Never Smoker  . Smokeless tobacco: Never Used  . Alcohol use No  . Drug use: No  . Sexual activity: Yes    Partners: Male    Birth control/ protection: IUD   Other Topics Concern  . None   Social History Narrative   3 kids, ranging 3-8 y/o   Married almost 10 years   Doesn't work   Fun: [employer], reading, hiking    Allergies: Allergies  Allergen Reactions  .  Other Anaphylaxis    Coke and red mt. Dew, Throat closes up    Outpatient Meds: Current Outpatient Prescriptions  Medication Sig Dispense Refill  . Ferrous Fumarate 63 (20 Fe) MG TABS Take 1 tablet by mouth daily.      No current facility-administered medications for this visit.       ___________________________________________________________________ Objective   Exam:  BP 100/70   Pulse 68   Ht 5\' 1"  (1.549 m)   Wt 148 lb (67.1 kg)   BMI 27.96 kg/m    General: this is a(n) Well-appearing woman   Eyes: sclera anicteric, no redness  ENT: oral mucosa moist without lesions, no cervical or supraclavicular lymphadenopathy, good  dentition  CV: RRR without murmur, S1/S2, no JVD, no peripheral edema  Resp: clear to auscultation bilaterally, normal RR and effort noted  GI: soft, no tenderness, with active bowel sounds. No guarding or palpable organomegaly noted.  Skin; warm and dry, no rash or jaundice noted  Neuro: awake, alert and oriented x 3. Normal gross motor function and fluent speech  Labs:  nml CBC, B12, CMP, neg tTG IgA and total IgA level Neg lyme, EBV and RMSF testing  Assessment: Encounter Diagnoses  Name Primary?  . Periumbilical pain Yes  . Diarrhea, unspecified type     It sounds like she has some nonspecific lower digestive symptoms as part of a systemic constellation of symptoms that occurred after a viral infection. Her GI symptoms have been slowly but steadily improving, and I do not think she has an underlying primary digestive disorder causing the rest of the symptoms.  As such, I think her endoscopic workup would be low yield.  Plan:  She was reassured I do not think antispasmodic therapy will likely be helpful given the intermittent and somewhat unpredictable symptoms. I will gladly see her as needed, and gave her my card in case she needs to contact me.  Thank you for the courtesy of this consult.  Please call me with any questions or concerns.  Charlie Pitter III  CC: [name], [name]

## 2017-05-12 DIAGNOSIS — F422 Mixed obsessional thoughts and acts: Secondary | ICD-10-CM | POA: Diagnosis not present

## 2017-05-22 ENCOUNTER — Encounter: Payer: Self-pay | Admitting: Physician Assistant

## 2017-06-07 ENCOUNTER — Ambulatory Visit (INDEPENDENT_AMBULATORY_CARE_PROVIDER_SITE_OTHER): Payer: BLUE CROSS/BLUE SHIELD

## 2017-06-07 ENCOUNTER — Ambulatory Visit (INDEPENDENT_AMBULATORY_CARE_PROVIDER_SITE_OTHER): Payer: BLUE CROSS/BLUE SHIELD | Admitting: Podiatry

## 2017-06-07 ENCOUNTER — Encounter: Payer: Self-pay | Admitting: Podiatry

## 2017-06-07 DIAGNOSIS — M2041 Other hammer toe(s) (acquired), right foot: Secondary | ICD-10-CM

## 2017-06-07 DIAGNOSIS — M79671 Pain in right foot: Secondary | ICD-10-CM

## 2017-06-07 DIAGNOSIS — M21611 Bunion of right foot: Secondary | ICD-10-CM

## 2017-06-07 NOTE — Patient Instructions (Signed)
Pre-Operative Instructions  Congratulations, you have decided to take an important step towards improving your quality of life.  You can be assured that the doctors and staff at [facility] will be with you every step of the way.  Here are some important things you should know:  1. Plan to be at the surgery center/hospital at least 1 (one) hour prior to your scheduled time, unless otherwise directed by the surgical center/hospital staff.  You must have a responsible adult accompany you, remain during the surgery and drive you home.  Make sure you have directions to the surgical center/hospital to ensure you arrive on time. 2. If you are having surgery at [facility] or [facility], you will need a copy of your medical history and physical form from your family physician within one month prior to the date of surgery. We will give you a form for your primary physician to complete.  3. We make every effort to accommodate the date you request for surgery.  However, there are times where surgery dates or times have to be moved.  We will contact you as soon as possible if a change in schedule is required.   4. No aspirin/ibuprofen for one week before surgery.  If you are on aspirin, any non-steroidal anti-inflammatory medications (Mobic, Aleve, Ibuprofen) should not be taken seven (7) days prior to your surgery.  You make take Tylenol for pain prior to surgery.  5. Medications - If you are taking daily heart and blood pressure medications, seizure, reflux, allergy, asthma, anxiety, pain or diabetes medications, make sure you notify the surgery center/hospital before the day of surgery so they can tell you which medications you should take or avoid the day of surgery. 6. No food or drink after midnight the night before surgery unless directed otherwise by surgical center/hospital staff. 7. No alcoholic beverages 24-hours prior to surgery.  No smoking 24-hours prior or 24-hours after  surgery. 8. Wear loose pants or shorts. They should be loose enough to fit over bandages, boots, and casts. 9. Don't wear slip-on shoes. Sneakers are preferred. 10. Bring your boot with you to the surgery center/hospital.  Also bring crutches or a walker if your physician has prescribed it for you.  If you do not have this equipment, it will be provided for you after surgery. 11. If you have not been contacted by the surgery center/hospital by the day before your surgery, call to confirm the date and time of your surgery. 12. Leave-time from work may vary depending on the type of surgery you have.  Appropriate arrangements should be made prior to surgery with your employer. 13. Prescriptions will be provided immediately following surgery by your doctor.  Fill these as soon as possible after surgery and take the medication as directed. Pain medications will not be refilled on weekends and must be approved by the doctor. 14. Remove nail polish on the operative foot and avoid getting pedicures prior to surgery. 15. Wash the night before surgery.  The night before surgery wash the foot and leg well with water and the antibacterial soap provided. Be sure to pay special attention to beneath the toenails and in between the toes.  Wash for at least three (3) minutes. Rinse thoroughly with water and dry well with a towel.  Perform this wash unless told not to do so by your physician.  Enclosed: 1 Ice pack (please put in freezer the night before surgery)   1 Hibiclens skin cleaner     Pre-op instructions  If you have any questions regarding the instructions, please do not hesitate to call our office.  [place]: [address], [place], [place] [address] -- [phone]  [place]: [address] [place], [place] [address] -- [phone]  [place]: [address]  , [place] [address] -- [phone]  [place]: [address], [address], [place], [place] [address] -- [phone]  Website:  [email or web address] 

## 2017-06-08 NOTE — Progress Notes (Signed)
   Subjective: 31 year old female presenting today as a new patient complaining of throbbing pain to the right foot around the great toe onset 2 years ago. She states walking and exercising increase the pain. She has not done anything to treat the symptoms. She is here for further evaluation and treatment.    Past Medical History:  Diagnosis Date  . Anemia   . Anxiety   . Bell's palsy   . Migraines   . OCD (obsessive compulsive disorder)   . Pneumonia 03/2017  . Rectal prolapse       Objective: Physical Exam General: The patient is alert and oriented x3 in no acute distress.  Dermatology: Skin is cool, dry and supple bilateral lower extremities. Negative for open lesions or macerations.  Vascular: Palpable pedal pulses bilaterally. No edema or erythema noted. Capillary refill within normal limits.  Neurological: Epicritic and protective threshold grossly intact bilaterally.   Musculoskeletal Exam: Clinical evidence of bunion deformity noted to the respective foot. There is a moderate pain on palpation range of motion of the first MPJ. Lateral deviation of the hallux noted consistent with hallux abductovalgus. Hammertoe contracture also noted on clinical exam to digits second digit of the right foot. Symptomatic pain on palpation and range of motion also noted to the metatarsal phalangeal joints of the respective hammertoe digits.    Radiographic Exam: Increased intermetatarsal angle greater than 15 with a hallux abductus angle greater than 30 noted on AP view. Moderate degenerative changes noted within the first MPJ. Contracture deformity also noted to the interphalangeal joints and MPJs of the digits of the respective hammertoes.    Assessment: 1. 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 Patient was evaluated. X-rays reviewed. 2. Today we discussed the conservative versus surgical management of  the presenting pathology. The patient opts for surgical management. All possible complications and details of the procedure were explained. All patient questions were answered. No guarantees were expressed or implied. 3. Authorization for surgery was initiated today. Surgery will consist of bunionectomy with 1st met osteotomy. PIPJ arthroplasty with MPJ capsulotomy right. 4. Appt with Liliane Channel for custom molded orthotics. 5. Return to clinic 1 week postop.     Edrick Kins, DPM [facility]  Dr. Edrick Kins, [address]                                        [place], [place] [address]                Office [phone]  Fax [phone]

## 2017-06-21 ENCOUNTER — Telehealth: Payer: Self-pay | Admitting: *Deleted

## 2017-06-21 NOTE — Telephone Encounter (Signed)
"  I am calling because I scheduled a surgery for November 29.  It looks like I'm not going to be able to do that.  If you would give me a call back."

## 2017-06-23 DIAGNOSIS — F422 Mixed obsessional thoughts and acts: Secondary | ICD-10-CM | POA: Diagnosis not present

## 2017-06-28 NOTE — Telephone Encounter (Signed)
I am returning your call.  You want to cancel your surgery?  "Yes, I am scheduled for November 29.  My husband is having surgery around the same time.  It will not be good for us to do it at the same time."  Okay, I'll let Dr. Logan BoresEvans know and I will cancel it at the surgical center."  I called and informed [facility]ynthia [facility] about the cancellation for 11//29/2018.

## 2017-06-29 ENCOUNTER — Other Ambulatory Visit: Payer: BLUE CROSS/BLUE SHIELD | Admitting: Orthotics

## 2017-07-03 ENCOUNTER — Ambulatory Visit (INDEPENDENT_AMBULATORY_CARE_PROVIDER_SITE_OTHER): Payer: BLUE CROSS/BLUE SHIELD | Admitting: Orthotics

## 2017-07-03 DIAGNOSIS — M722 Plantar fascial fibromatosis: Secondary | ICD-10-CM | POA: Diagnosis not present

## 2017-07-03 DIAGNOSIS — M2041 Other hammer toe(s) (acquired), right foot: Secondary | ICD-10-CM

## 2017-07-03 DIAGNOSIS — M21611 Bunion of right foot: Secondary | ICD-10-CM

## 2017-07-03 NOTE — Progress Notes (Signed)

## 2017-07-24 DIAGNOSIS — M542 Cervicalgia: Secondary | ICD-10-CM | POA: Diagnosis not present

## 2017-07-24 DIAGNOSIS — M546 Pain in thoracic spine: Secondary | ICD-10-CM | POA: Diagnosis not present

## 2017-07-24 DIAGNOSIS — M9902 Segmental and somatic dysfunction of thoracic region: Secondary | ICD-10-CM | POA: Diagnosis not present

## 2017-07-24 DIAGNOSIS — M9901 Segmental and somatic dysfunction of cervical region: Secondary | ICD-10-CM | POA: Diagnosis not present

## 2017-07-25 ENCOUNTER — Ambulatory Visit: Payer: BLUE CROSS/BLUE SHIELD | Admitting: Orthotics

## 2017-07-25 DIAGNOSIS — M2041 Other hammer toe(s) (acquired), right foot: Secondary | ICD-10-CM

## 2017-07-25 DIAGNOSIS — M9901 Segmental and somatic dysfunction of cervical region: Secondary | ICD-10-CM | POA: Diagnosis not present

## 2017-07-25 DIAGNOSIS — M546 Pain in thoracic spine: Secondary | ICD-10-CM | POA: Diagnosis not present

## 2017-07-25 DIAGNOSIS — M9902 Segmental and somatic dysfunction of thoracic region: Secondary | ICD-10-CM | POA: Diagnosis not present

## 2017-07-25 DIAGNOSIS — M21611 Bunion of right foot: Secondary | ICD-10-CM

## 2017-07-25 DIAGNOSIS — M542 Cervicalgia: Secondary | ICD-10-CM | POA: Diagnosis not present

## 2017-07-25 NOTE — Progress Notes (Signed)
Patient came in today to pick up custom made foot orthotics.  The goals were accomplished and the patient reported no dissatisfaction with said orthotics.  Patient was advised of breakin period and how to report any issues. 

## 2017-07-26 DIAGNOSIS — M9901 Segmental and somatic dysfunction of cervical region: Secondary | ICD-10-CM | POA: Diagnosis not present

## 2017-07-26 DIAGNOSIS — M9902 Segmental and somatic dysfunction of thoracic region: Secondary | ICD-10-CM | POA: Diagnosis not present

## 2017-07-26 DIAGNOSIS — M542 Cervicalgia: Secondary | ICD-10-CM | POA: Diagnosis not present

## 2017-07-26 DIAGNOSIS — M546 Pain in thoracic spine: Secondary | ICD-10-CM | POA: Diagnosis not present

## 2017-07-28 DIAGNOSIS — M542 Cervicalgia: Secondary | ICD-10-CM | POA: Diagnosis not present

## 2017-07-28 DIAGNOSIS — M9901 Segmental and somatic dysfunction of cervical region: Secondary | ICD-10-CM | POA: Diagnosis not present

## 2017-07-28 DIAGNOSIS — M9902 Segmental and somatic dysfunction of thoracic region: Secondary | ICD-10-CM | POA: Diagnosis not present

## 2017-07-28 DIAGNOSIS — M546 Pain in thoracic spine: Secondary | ICD-10-CM | POA: Diagnosis not present

## 2017-07-31 DIAGNOSIS — M542 Cervicalgia: Secondary | ICD-10-CM | POA: Diagnosis not present

## 2017-07-31 DIAGNOSIS — M546 Pain in thoracic spine: Secondary | ICD-10-CM | POA: Diagnosis not present

## 2017-07-31 DIAGNOSIS — M9901 Segmental and somatic dysfunction of cervical region: Secondary | ICD-10-CM | POA: Diagnosis not present

## 2017-07-31 DIAGNOSIS — M9902 Segmental and somatic dysfunction of thoracic region: Secondary | ICD-10-CM | POA: Diagnosis not present

## 2017-08-01 DIAGNOSIS — M9902 Segmental and somatic dysfunction of thoracic region: Secondary | ICD-10-CM | POA: Diagnosis not present

## 2017-08-01 DIAGNOSIS — M9901 Segmental and somatic dysfunction of cervical region: Secondary | ICD-10-CM | POA: Diagnosis not present

## 2017-08-01 DIAGNOSIS — M546 Pain in thoracic spine: Secondary | ICD-10-CM | POA: Diagnosis not present

## 2017-08-01 DIAGNOSIS — M542 Cervicalgia: Secondary | ICD-10-CM | POA: Diagnosis not present

## 2017-08-02 DIAGNOSIS — M546 Pain in thoracic spine: Secondary | ICD-10-CM | POA: Diagnosis not present

## 2017-08-02 DIAGNOSIS — M542 Cervicalgia: Secondary | ICD-10-CM | POA: Diagnosis not present

## 2017-08-02 DIAGNOSIS — M9901 Segmental and somatic dysfunction of cervical region: Secondary | ICD-10-CM | POA: Diagnosis not present

## 2017-08-02 DIAGNOSIS — M9902 Segmental and somatic dysfunction of thoracic region: Secondary | ICD-10-CM | POA: Diagnosis not present

## 2017-08-07 DIAGNOSIS — M546 Pain in thoracic spine: Secondary | ICD-10-CM | POA: Diagnosis not present

## 2017-08-07 DIAGNOSIS — M9901 Segmental and somatic dysfunction of cervical region: Secondary | ICD-10-CM | POA: Diagnosis not present

## 2017-08-07 DIAGNOSIS — M542 Cervicalgia: Secondary | ICD-10-CM | POA: Diagnosis not present

## 2017-08-07 DIAGNOSIS — M9902 Segmental and somatic dysfunction of thoracic region: Secondary | ICD-10-CM | POA: Diagnosis not present

## 2017-08-09 DIAGNOSIS — M9902 Segmental and somatic dysfunction of thoracic region: Secondary | ICD-10-CM | POA: Diagnosis not present

## 2017-08-09 DIAGNOSIS — M9901 Segmental and somatic dysfunction of cervical region: Secondary | ICD-10-CM | POA: Diagnosis not present

## 2017-08-09 DIAGNOSIS — M546 Pain in thoracic spine: Secondary | ICD-10-CM | POA: Diagnosis not present

## 2017-08-09 DIAGNOSIS — M542 Cervicalgia: Secondary | ICD-10-CM | POA: Diagnosis not present

## 2017-08-10 DIAGNOSIS — M9901 Segmental and somatic dysfunction of cervical region: Secondary | ICD-10-CM | POA: Diagnosis not present

## 2017-08-10 DIAGNOSIS — M9902 Segmental and somatic dysfunction of thoracic region: Secondary | ICD-10-CM | POA: Diagnosis not present

## 2017-08-10 DIAGNOSIS — M542 Cervicalgia: Secondary | ICD-10-CM | POA: Diagnosis not present

## 2017-08-10 DIAGNOSIS — M546 Pain in thoracic spine: Secondary | ICD-10-CM | POA: Diagnosis not present

## 2017-08-14 DIAGNOSIS — M9902 Segmental and somatic dysfunction of thoracic region: Secondary | ICD-10-CM | POA: Diagnosis not present

## 2017-08-14 DIAGNOSIS — F422 Mixed obsessional thoughts and acts: Secondary | ICD-10-CM | POA: Diagnosis not present

## 2017-08-14 DIAGNOSIS — M542 Cervicalgia: Secondary | ICD-10-CM | POA: Diagnosis not present

## 2017-08-14 DIAGNOSIS — M9901 Segmental and somatic dysfunction of cervical region: Secondary | ICD-10-CM | POA: Diagnosis not present

## 2017-08-14 DIAGNOSIS — M546 Pain in thoracic spine: Secondary | ICD-10-CM | POA: Diagnosis not present

## 2017-08-16 DIAGNOSIS — M546 Pain in thoracic spine: Secondary | ICD-10-CM | POA: Diagnosis not present

## 2017-08-16 DIAGNOSIS — M9901 Segmental and somatic dysfunction of cervical region: Secondary | ICD-10-CM | POA: Diagnosis not present

## 2017-08-16 DIAGNOSIS — M9902 Segmental and somatic dysfunction of thoracic region: Secondary | ICD-10-CM | POA: Diagnosis not present

## 2017-08-16 DIAGNOSIS — M542 Cervicalgia: Secondary | ICD-10-CM | POA: Diagnosis not present

## 2017-08-18 DIAGNOSIS — M9901 Segmental and somatic dysfunction of cervical region: Secondary | ICD-10-CM | POA: Diagnosis not present

## 2017-08-18 DIAGNOSIS — M9902 Segmental and somatic dysfunction of thoracic region: Secondary | ICD-10-CM | POA: Diagnosis not present

## 2017-08-18 DIAGNOSIS — M542 Cervicalgia: Secondary | ICD-10-CM | POA: Diagnosis not present

## 2017-08-18 DIAGNOSIS — M546 Pain in thoracic spine: Secondary | ICD-10-CM | POA: Diagnosis not present

## 2017-08-31 ENCOUNTER — Telehealth: Payer: Self-pay | Admitting: Family Medicine

## 2017-08-31 ENCOUNTER — Ambulatory Visit (INDEPENDENT_AMBULATORY_CARE_PROVIDER_SITE_OTHER): Payer: BLUE CROSS/BLUE SHIELD | Admitting: Family Medicine

## 2017-08-31 ENCOUNTER — Ambulatory Visit: Payer: Self-pay | Admitting: *Deleted

## 2017-08-31 VITALS — BP 110/76 | HR 93 | Temp 98.1°F | Ht 61.0 in | Wt 147.0 lb

## 2017-08-31 DIAGNOSIS — R112 Nausea with vomiting, unspecified: Secondary | ICD-10-CM | POA: Diagnosis not present

## 2017-08-31 LAB — CBC WITH DIFFERENTIAL/PLATELET
Basophils Absolute: 0 10*3/uL (ref 0.0–0.1)
Basophils Relative: 0.4 % (ref 0.0–3.0)
Eosinophils Absolute: 0.1 10*3/uL (ref 0.0–0.7)
Eosinophils Relative: 0.5 % (ref 0.0–5.0)
HCT: 41.3 % (ref 36.0–46.0)
Hemoglobin: 12.9 g/dL (ref 12.0–15.0)
Lymphocytes Relative: 17.3 % (ref 12.0–46.0)
Lymphs Abs: 1.8 10*3/uL (ref 0.7–4.0)
MCHC: 31.2 g/dL (ref 30.0–36.0)
MCV: 70.9 fl — ABNORMAL LOW (ref 78.0–100.0)
Monocytes Absolute: 0.6 10*3/uL (ref 0.1–1.0)
Monocytes Relative: 6 % (ref 3.0–12.0)
Neutro Abs: 7.8 10*3/uL — ABNORMAL HIGH (ref 1.4–7.7)
Neutrophils Relative %: 75.8 % (ref 43.0–77.0)
Platelets: 359 10*3/uL (ref 150.0–400.0)
RBC: 5.83 Mil/uL — ABNORMAL HIGH (ref 3.87–5.11)
RDW: 15.1 % (ref 11.5–15.5)
WBC: 10.3 10*3/uL (ref 4.0–10.5)

## 2017-08-31 LAB — COMPREHENSIVE METABOLIC PANEL
ALT: 20 U/L (ref 0–35)
AST: 19 U/L (ref 0–37)
Albumin: 4.6 g/dL (ref 3.5–5.2)
Alkaline Phosphatase: 79 U/L (ref 39–117)
BUN: 13 mg/dL (ref 6–23)
CO2: 28 mEq/L (ref 19–32)
Calcium: 9.3 mg/dL (ref 8.4–10.5)
Chloride: 103 mEq/L (ref 96–112)
Creatinine, Ser: 0.71 mg/dL (ref 0.40–1.20)
GFR: 100.24 mL/min (ref >60.00)
Glucose, Bld: 92 mg/dL (ref 70–99)
Potassium: 3.7 mEq/L (ref 3.5–5.1)
Sodium: 139 mEq/L (ref 135–145)
Total Bilirubin: 0.3 mg/dL (ref 0.2–1.2)
Total Protein: 8.1 g/dL (ref 6.0–8.3)

## 2017-08-31 LAB — POC URINALSYSI DIPSTICK (AUTOMATED)
Blood, UA: NEGATIVE
Glucose, UA: NEGATIVE
Ketones, UA: NEGATIVE
Leukocytes, UA: NEGATIVE
Nitrite, UA: NEGATIVE
Protein, UA: 15
Spec Grav, UA: >=1.03 — AB (ref 1.010–1.025)
Urobilinogen, UA: 0.2 E.U./dL
pH, UA: 6 (ref 5.0–8.0)

## 2017-08-31 LAB — HCG, QUANTITATIVE, PREGNANCY: Quantitative HCG: 0.72 m[IU]/mL

## 2017-08-31 LAB — LIPASE: Lipase: 13 U/L (ref 11.0–59.0)

## 2017-08-31 MED ORDER — ONDANSETRON HCL 4 MG PO TABS: 4.0000 mg | ORAL_TABLET | Freq: Four times a day (QID) | ORAL | 0 refills | Status: DC

## 2017-08-31 MED ORDER — ONDANSETRON 4 MG PO TBDP: 4.0000 mg | ORAL_TABLET | Freq: Three times a day (TID) | ORAL | 0 refills | Status: AC | PRN

## 2017-08-31 MED ORDER — ONDANSETRON HCL 4 MG/2ML IJ SOLN
4.0000 mg | Freq: Once | INTRAMUSCULAR | Status: AC
  Administered 2017-08-31: 4 mg via INTRAMUSCULAR

## 2017-08-31 NOTE — Telephone Encounter (Signed)
Been vomiting since Sunday.   She is starting to get blood tinged vomit.   She thinks its from vomiting since Sunday night. Got an appt for her with Dr. Helane RimaErica Wallace at 9:00 this morning.   She lives 10 min. From the office and assured me she could go right now and be there at 8:45 to get registered. Reason for Disposition . [1] MILD or MODERATE vomiting AND [2] present > 48 hours (2 days) (Exception: mild vomiting with associated diarrhea)  Answer Assessment - Initial Assessment Questions 1. VOMITING SEVERITY: "How many times have you vomited in the past 24 hours?"     - MILD:  1 - 2 times/day    - MODERATE: 3 - 5 times/day, decreased oral intake without significant weight loss or symptoms of dehydration    - SEVERE: 6 or more times/day, vomits everything or nearly everything, with significant weight loss, symptoms of dehydration      This morning vomited 5 times.   I started last night about 5 times.   I started having diarrhea this morning that's pretty bad. 2. ONSET: "When did the vomiting begin?"      Last night 3. FLUIDS: "What fluids or food have you vomited up today?" "Have you been able to keep any fluids down?"     You were able to get some food down yesterday but vomited again.   I didn't eat anything on Monday.    4. ABDOMINAL PAIN: "Are your having any abdominal pain?" If yes : "How bad is it and what does it feel like?" (e.g., crampy, dull, intermittent, constant)      I am having a sharp comes and goes pain.  I'm having pain in my lower back as well as my lower abd. 5. DIARRHEA: "Is there any diarrhea?" If so, ask: "How many times today?"      I started with diarrhea this morning that's really bad.   6. CONTACTS: "Is there anyone else in the family with the same symptoms?"      I've had a cold and my family has been sick with colds.   My daughter a week ago threw up once and was done. 7. CAUSE: "What do you think is causing your vomiting?"     I can't think of anything. 8.  HYDRATION STATUS: "Any signs of dehydration?" (e.g., dry mouth [not only dry lips], too weak to stand) "When did you last urinate?"     I'm not able to keep water down now. 9. OTHER SYMPTOMS: "Do you have any other symptoms?" (e.g., fever, headache, vertigo, vomiting blood or coffee grounds, recent head injury)     No fever, did have fever on Monday low grade and Tuesday but not today.  I've been dizzy I fainted on Sunday night.   I think from exhaustion.   I have been throwing up little flecks of blood in vomit.  I think I'm irritated from all the vomiting. 10. PREGNANCY: "Is there any chance you are pregnant?" "When was your last menstrual period?"       I have have an IUD.  Protocols used: [facility]

## 2017-08-31 NOTE — Telephone Encounter (Signed)
Dr. Wallace, please see message. 

## 2017-08-31 NOTE — Telephone Encounter (Signed)
Copied from CRM [id number]. Topic: Quick Communication - Rx Refill/Question >> Aug 31, 2017 10:02 AM Viviann SpareWhite, Selina wrote: Has the patient contacted their pharmacy? Yes.  ondansetron (ZOFRAN) 4 MG tablet  (PATIENT WIFE CALLED AND SAID THE WRONG MED WAS CALL IN TO THE PHARMACY. SHE SAID THAT IS SHOULD HAVE BEEN THE DISSOLUBLE TABLETS OF THE ondansetron (ZOFRAN) 4 MG TABLET). (Agent: If no, request that the patient contact the pharmacy for the refill.)  Preferred Pharmacy (with phone number or street name):  [id number] [place]- [place], [place] - [address] [place] [address] Phone: [phone] Fax: [phone]   Agent: Please be advised that RX refills may take up to 3 business days. We ask that you follow-up with your pharmacy.

## 2017-08-31 NOTE — Telephone Encounter (Signed)
Please be advised and see note below about medication refill.

## 2017-08-31 NOTE — Telephone Encounter (Signed)
ODT sent to pharmacy.

## 2017-08-31 NOTE — Telephone Encounter (Signed)
See attached request. Thanks. 

## 2017-09-02 ENCOUNTER — Encounter: Payer: Self-pay | Admitting: Family Medicine

## 2017-09-02 NOTE — Progress Notes (Signed)
Yolanda Kelly is a 31 y.o. female here for an acute visit.  History of Present Illness:   HPI:   Nausea / Vomiting Patient complains of nausea and vomiting. Onset of symptoms was 2 days ago. Patient describes nausea as moderate. Vomiting has occurred several times over the past 2 days. Vomitus is described as normal gastric contents. Symptoms have been associated with mild diffuse abdominal pain, and loose stools. Patient denies alcohol overuse, fever, hematemesis, melena and possibility of pregnancy. Symptoms have waxed and waned. Evaluation to date has been none. Treatment to date has been none.   PMHx, SurgHx, SocialHx, Medications, and Allergies were reviewed in the Visit Navigator and updated as appropriate.  Current Medications:   .  Ferrous Fumarate 63 (20 Fe) MG TABS, Take 1 tablet by mouth daily. , Disp: , Rfl:   Allergies  Allergen Reactions  . Other Anaphylaxis    Coke and red mt. Dew, Throat closes up   Review of Systems:   Pertinent items are noted in the HPI. Otherwise, ROS is negative.  Vitals:   Vitals:   08/31/17 0855  BP: 110/76  Pulse: 93  Temp: 98.1 F (36.7 C)  TempSrc: Oral  SpO2: 97%  Weight: 147 lb (66.7 kg)  Height: 5\' 1"  (1.549 m)     Body mass index is 27.78 kg/m.   Physical Exam:   Physical Exam  Constitutional: She appears well-developed and well-nourished.  HENT:  Head: Normocephalic and atraumatic.  Right Ear: External ear normal.  Left Ear: External ear normal.  Nose: Nose normal.  Mouth/Throat: Oropharynx is clear and moist.  Eyes: Conjunctivae and EOM are normal. Pupils are equal, round, and reactive to light.  Neck: Normal range of motion. Neck supple. No thyromegaly present.  Cardiovascular: Normal rate, regular rhythm, normal heart sounds and intact distal pulses.  Pulmonary/Chest: Effort normal and breath sounds normal.  Abdominal: Soft. There is generalized tenderness. There is no rigidity, no rebound, no guarding and no  CVA tenderness.  Skin: Skin is warm.  Psychiatric: She has a normal mood and affect. Her behavior is normal.  Nursing note and vitals reviewed.   Results for orders placed or performed in visit on 08/31/17  CBC with Differential/Platelet  Result Value Ref Range   WBC 10.3 4.0 - 10.5 K/uL   RBC 5.83 (H) 3.87 - 5.11 Mil/uL   Hemoglobin 12.9 12.0 - 15.0 g/dL   HCT 60.441.3 54.036.0 - 98.146.0 %   MCV 70.9 (L) 78.0 - 100.0 fl   MCHC 31.2 30.0 - 36.0 g/dL   RDW 19.115.1 47.811.5 - 29.515.5 %   Platelets 359.0 150.0 - 400.0 K/uL   Neutrophils Relative % 75.8 43.0 - 77.0 %   Lymphocytes Relative 17.3 12.0 - 46.0 %   Monocytes Relative 6.0 3.0 - 12.0 %   Eosinophils Relative 0.5 0.0 - 5.0 %   Basophils Relative 0.4 0.0 - 3.0 %   Neutro Abs 7.8 (H) 1.4 - 7.7 K/uL   Lymphs Abs 1.8 0.7 - 4.0 K/uL   Monocytes Absolute 0.6 0.1 - 1.0 K/uL   Eosinophils Absolute 0.1 0.0 - 0.7 K/uL   Basophils Absolute 0.0 0.0 - 0.1 K/uL  Comprehensive metabolic panel  Result Value Ref Range   Sodium 139 135 - 145 mEq/L   Potassium 3.7 3.5 - 5.1 mEq/L   Chloride 103 96 - 112 mEq/L   CO2 28 19 - 32 mEq/L   Glucose, Bld 92 70 - 99 mg/dL   BUN 13  6 - 23 mg/dL   Creatinine, Ser 1.610.71 0.40 - 1.20 mg/dL   Total Bilirubin 0.3 0.2 - 1.2 mg/dL   Alkaline Phosphatase 79 39 - 117 U/L   AST 19 0 - 37 U/L   ALT 20 0 - 35 U/L   Total Protein 8.1 6.0 - 8.3 g/dL   Albumin 4.6 3.5 - 5.2 g/dL   Calcium 9.3 8.4 - 09.610.5 mg/dL   GFR 045.40100.24 >98.11>60.00 mL/min  Lipase  Result Value Ref Range   Lipase 13.0 11.0 - 59.0 U/L  B-HCG Quant  Result Value Ref Range   Quantitative HCG 0.72 mIU/ml  POCT Urinalysis Dipstick (Automated)  Result Value Ref Range   Color, UA Yellow    Clarity, UA Hazy    Glucose, UA Negative    Bilirubin, UA 2+    Ketones, UA Negative    Spec Grav, UA >=1.030 (A) 1.010 - 1.025   Blood, UA Negative    pH, UA 6.0 5.0 - 8.0   Protein, UA 15    Urobilinogen, UA 0.2 0.2 or 1.0 E.U./dL   Nitrite, UA Negative    Leukocytes, UA  Negative Negative   Assessment and Plan:   Diagnoses and all orders for this visit:  Non-intractable vomiting with nausea, unspecified vomiting type Comments: Consistent with GE, likely viral. Zofran given in office with sucess. Advised rest, gentle hydration, and monitoring symptoms. Labs reassuring. Rx zofran provided.  Orders: -     ondansetron (ZOFRAN) injection 4 mg -     CBC with Differential/Platelet -     Comprehensive metabolic panel -     POCT Urinalysis Dipstick (Automated) -     Lipase -     B-HCG Quant - ondansetron (ZOFRAN) 4 MG ODT; Take 1 tablet (4 mg total) by mouth every 6 (six) hours.   . Reviewed expectations re: course of current medical issues. . Discussed self-management of symptoms. . Outlined signs and symptoms indicating need for more acute intervention. . Patient verbalized understanding and all questions were answered. Marland Kitchen. Health Maintenance issues including appropriate healthy diet, exercise, and smoking avoidance were discussed with patient. . See orders for this visit as documented in the electronic medical record. . Patient received an After Visit Summary.  Helane RimaErica Deloria Brassfield, DO [place] 09/02/2017

## 2017-09-15 DIAGNOSIS — F422 Mixed obsessional thoughts and acts: Secondary | ICD-10-CM | POA: Diagnosis not present

## 2017-10-08 DIAGNOSIS — H6983 Other specified disorders of Eustachian tube, bilateral: Secondary | ICD-10-CM | POA: Diagnosis not present

## 2017-10-16 DIAGNOSIS — F422 Mixed obsessional thoughts and acts: Secondary | ICD-10-CM | POA: Diagnosis not present

## 2017-11-03 ENCOUNTER — Encounter (HOSPITAL_COMMUNITY): Payer: Self-pay | Admitting: Emergency Medicine

## 2017-11-03 ENCOUNTER — Emergency Department (HOSPITAL_COMMUNITY)
Admission: EM | Admit: 2017-11-03 | Discharge: 2017-11-04 | Disposition: A | Discharge status: Home / Self Care | Payer: BLUE CROSS/BLUE SHIELD | Attending: Emergency Medicine | Admitting: Emergency Medicine

## 2017-11-03 DIAGNOSIS — Z79899 Other long term (current) drug therapy: Secondary | ICD-10-CM | POA: Diagnosis not present

## 2017-11-03 DIAGNOSIS — R55 Syncope and collapse: Secondary | ICD-10-CM | POA: Diagnosis not present

## 2017-11-03 DIAGNOSIS — R42 Dizziness and giddiness: Secondary | ICD-10-CM | POA: Diagnosis not present

## 2017-11-03 LAB — BASIC METABOLIC PANEL
ANION GAP: 10 (ref 5–15)
BUN: 8 mg/dL (ref 6–20)
CO2: 24 mmol/L (ref 22–32)
Calcium: 9.1 mg/dL (ref 8.9–10.3)
Chloride: 105 mmol/L (ref 101–111)
Creatinine, Ser: 0.67 mg/dL (ref 0.44–1.00)
GFR calc Af Amer: >60 mL/min (ref >60)
GLUCOSE: 111 mg/dL — AB (ref 65–99)
Potassium: 3.7 mmol/L (ref 3.5–5.1)
SODIUM: 139 mmol/L (ref 135–145)

## 2017-11-03 LAB — I-STAT BETA HCG BLOOD, ED (MC, WL, AP ONLY)

## 2017-11-03 LAB — CBC
HCT: 38.1 % (ref 36.0–46.0)
HEMOGLOBIN: 12 g/dL (ref 12.0–15.0)
MCH: 22.3 pg — ABNORMAL LOW (ref 26.0–34.0)
MCHC: 31.5 g/dL (ref 30.0–36.0)
MCV: 70.9 fL — ABNORMAL LOW (ref 78.0–100.0)
Platelets: 378 10*3/uL (ref 150–400)
RBC: 5.37 MIL/uL — ABNORMAL HIGH (ref 3.87–5.11)
RDW: 15.1 % (ref 11.5–15.5)
WBC: 11.5 10*3/uL — AB (ref 4.0–10.5)

## 2017-11-03 LAB — URINALYSIS, ROUTINE W REFLEX MICROSCOPIC
BILIRUBIN URINE: NEGATIVE
Glucose, UA: NEGATIVE mg/dL
Hgb urine dipstick: NEGATIVE
KETONES UR: NEGATIVE mg/dL
Leukocytes, UA: NEGATIVE
NITRITE: NEGATIVE
Protein, ur: NEGATIVE mg/dL
Specific Gravity, Urine: 1.002 — ABNORMAL LOW (ref 1.005–1.030)
pH: 6 (ref 5.0–8.0)

## 2017-11-03 NOTE — ED Triage Notes (Signed)
Patient states she had witnessed syncopal episode around 6:10 tonight. Pt has seen many neurologist due to hx of syncopal episodes and issues since having shingles in her hear. Pt states hasn't seen specialist in 5-6 months. Around this afternoon pt noticed vision changes and right frontal head pressure. States after syncopal episode she woke up with better vision but symptoms are now coming back. Denies hitting head.

## 2017-11-03 NOTE — ED Notes (Signed)
This RN spoke with Fredderick PhenixBelfi, MD regarding patients symptoms. Agreed with current orders placed until evaluating patient in room.

## 2017-11-03 NOTE — ED Notes (Signed)
ED Provider at bedside. 

## 2017-11-04 ENCOUNTER — Emergency Department (HOSPITAL_COMMUNITY): Payer: BLUE CROSS/BLUE SHIELD

## 2017-11-04 DIAGNOSIS — R55 Syncope and collapse: Secondary | ICD-10-CM | POA: Diagnosis not present

## 2017-11-04 NOTE — Discharge Instructions (Signed)
Your labs, urine, EKG and head CT were normal.  Please follow-up with your PCP and neurologist.

## 2017-11-04 NOTE — ED Provider Notes (Signed)
TIME SEEN: 12:02 AM  CHIEF COMPLAINT: Syncope  HPI: Patient is a 32 year old female with history of migraines, Ramsay Hunt 2 years ago who presents to the emergency department with syncopal event.  States that today she had twitching of her legs and bilateral blurry vision which she has had intermittently for the past 2 years.  Also has a history of migraines and states she did have a right-sided throbbing headache that felt similar to her migraines.  Has also had intermittent numbness and weakness in the right side of her face since her Ramsay Hunt 2 years ago.  Has been seen by [facility] neurology as an outpatient and had a normal MR brain 15 months ago.  Has also been seen by an ophthalmologist.  Also reports normal EMG.  She denies any chest pain or shortness of breath.  No history of ACS, arrhythmia, PE.  No recent fever.  Reports she has been getting over a cold.  No vomiting or diarrhea.  Reports headache and was completely resolved and declines any medication at this time.  States that she was walking to her grandfather's house when she felt very lightheaded and went down to the ground on all fours and then had a brief syncopal episode that lasted "2 seconds".  No seizure activity or postictal period.  Witnessed by her husband.  Has never had a syncopal event before.  Called the on-call nurse with her PCP who recommended she come to the emergency department.  ROS: See HPI Constitutional: no fever  Eyes: no drainage  ENT: no runny nose   Cardiovascular:  no chest pain  Resp: no SOB  GI: no vomiting GU: no dysuria Integumentary: no rash  Allergy: no hives  Musculoskeletal: no leg swelling  Neurological: no slurred speech ROS otherwise negative  PAST MEDICAL HISTORY/PAST SURGICAL HISTORY:  Past Medical History:  Diagnosis Date  . Anemia   . Anxiety   . Bell's palsy   . Migraines   . OCD (obsessive compulsive disorder)   . Pneumonia 03/2017  . Rectal prolapse     MEDICATIONS:   Prior to Admission medications   Medication Sig Start Date End Date Taking? Authorizing Provider  Ferrous Fumarate 63 (20 Fe) MG TABS Take 1 tablet by mouth daily.    Yes [provider]    ALLERGIES:  Allergies  Allergen Reactions  . Other Anaphylaxis    Coke and red mt. [place], Throat closes up    SOCIAL HISTORY:  Social History   Tobacco Use  . Smoking status: Never Smoker  . Smokeless tobacco: Never Used  Substance Use Topics  . Alcohol use: No    FAMILY HISTORY: Family History  Problem Relation Age of Onset  . Thyroid disease Mother   . Kidney cancer Mother   . Asthma Father   . Irritable bowel syndrome Father   . Heart disease Sister   . Diabetes Mellitus II Maternal Grandmother   . Mental illness Maternal Grandfather   . Glaucoma Paternal Grandfather   . Celiac disease Sister   . Celiac disease Son        tests high for it, not 100% sure he has it  . Breast cancer Maternal Aunt   . Colon cancer Neg Hx   . Throat cancer Neg Hx   . Rectal cancer Neg Hx     EXAM: BP 105/73 (BP Location: Left Arm)   Pulse 81   Temp 98.1 F (36.7 C) (Oral)   Resp 20  Ht 5\' 2"  (1.575 m)   Wt 65.8 kg (145 lb)   LMP 10/31/2017   SpO2 98%   BMI 26.52 kg/m  CONSTITUTIONAL: Alert and oriented and responds appropriately to questions. Well-appearing; well-nourished HEAD: Normocephalic EYES: Conjunctivae clear, pupils appear equal, EOMI ENT: normal nose; moist mucous membranes NECK: Supple, no meningismus, no nuchal rigidity, no LAD  CARD: RRR; S1 and S2 appreciated; no murmurs, no clicks, no rubs, no gallops RESP: Normal chest excursion without splinting or tachypnea; breath sounds clear and equal bilaterally; no wheezes, no rhonchi, no rales, no hypoxia or respiratory distress, speaking full sentences ABD/GI: Normal bowel sounds; non-distended; soft, non-tender, no rebound, no guarding, no peritoneal signs, no hepatosplenomegaly BACK:  The back appears normal and is  non-tender to palpation, there is no CVA tenderness EXT: Normal ROM in all joints; non-tender to palpation; no edema; normal capillary refill; no cyanosis, no calf tenderness or swelling    SKIN: Normal color for age and race; warm; no rash NEURO: Moves all extremities equally, strength 5/5 in all 4 extremities, cranial nerves II through XII intact, normal speech, no facial asymmetry, no dysmetria to finger to nose testing bilaterally PSYCH: The patient's mood and manner are appropriate. Grooming and personal hygiene are appropriate.  MEDICAL DECISION MAKING: Patient here with syncopal event.  Doubt ACS, PE, dissection, arrhythmia.  No sign of she is neurologically intact.  Doubt subarachnoid hemorrhage.  Does not describe this as a sudden onset, worst headache of her life.  Has had similar headaches before.  Normal hemoglobin.  Normal electrolytes.  Pregnancy test is negative.  EKG shows no ischemic abnormality, arrhythmia or interval change.  She declines any medication for her headache at this time.  Will obtain head CT for further evaluation but again low suspicion for intracranial hemorrhage, mass, stroke.  No seizure-like activity noted.  If this is unremarkable, anticipate discharge with close PCP and neurology follow-up as an outpatient.  ED PROGRESS: Patient's head CT is unremarkable.  Still neurologically intact.  I will have her follow-up with her outpatient neurologist.  Recommended increase fluid intake at home.  Discussed at length return precautions.  Patient and husband are comfortable with this plan.  Discussed with patient that she may need an MRI of her brain but they are comfortable proceeding with this as an outpatient with her neurologist.   At this time, I do not feel there is any life-threatening condition present. I have reviewed and discussed all results (EKG, imaging, lab, urine as appropriate) and exam findings with patient/family. I have reviewed nursing notes and appropriate  previous records.  I feel the patient is safe to be discharged home without further emergent workup and can continue workup as an outpatient as needed. Discussed usual and customary return precautions. Patient/family verbalize understanding and are comfortable with this plan.  Outpatient follow-up has been provided if needed. All questions have been answered.    EKG Interpretation  Date/Time:  Friday November 03 2017 19:56:17 EST Ventricular Rate:  69 PR Interval:    QRS Duration: 87 QT Interval:  365 QTC Calculation: 391 R Axis:   95 Text Interpretation:  Sinus rhythm Borderline right axis deviation No old tracing to compare Confirmed by Xyla Leisner, Baxter Hire [phone]) on 11/03/2017 11:19:52 PM         Hiliana Eilts, Layla Maw, DO 11/04/17 6045

## 2017-11-04 NOTE — ED Notes (Signed)
Patient transported to CT 

## 2017-11-04 NOTE — ED Notes (Signed)
ED Provider at bedside. 

## 2017-11-06 ENCOUNTER — Telehealth: Payer: Self-pay | Admitting: Neurology

## 2017-11-06 NOTE — Telephone Encounter (Signed)
Please review ED note and me know how soon patient should be seen.

## 2017-11-06 NOTE — Telephone Encounter (Signed)
Next available is fine.

## 2017-11-06 NOTE — Telephone Encounter (Signed)
Pt left a voicemail message saying she was in the hospital and wanted to give an update and wanted to know if she needs to be seen

## 2017-11-06 NOTE — Telephone Encounter (Signed)
Patient made aware. She will schedule with the front desk.

## 2017-11-13 DIAGNOSIS — F422 Mixed obsessional thoughts and acts: Secondary | ICD-10-CM | POA: Diagnosis not present

## 2017-11-14 ENCOUNTER — Ambulatory Visit: Payer: Self-pay | Admitting: *Deleted

## 2017-11-14 NOTE — Telephone Encounter (Signed)
Patient reports she got bit by unknown type of spider. She states she had initial redness and irritation at site that is getting better with ice and time. She does state there is a "acne" looking bump that is pink in color. No pus present or white line. No increase in size or pain during triage call. Reviewed danger signs and things to look for in poison/infected site. Patient is aware and she will call back with any complications.  Reason for Disposition . Non-serious spider bite  Answer Assessment - Initial Assessment Questions 1. TYPE of INSECT: "What type of insect was it?"      Possible spider bite 2. ONSET: "When did you get bitten?"      11:00 3. LOCATION: "Where is the insect bite located?"      R wrist 4. REDNESS: "Is the area red or pink?" If so, ask "What size is area of redness?" (inches or cm). "When did the redness start?"     Pink, 1 inch by 1/2 inch,  Shortly after 5. PAIN: "Is there any pain?" If so, ask: "How bad is it?"  (Scale 1-10; or mild, moderate, severe)     2- hurts to touch 6. ITCHING: "Does it itch?" If so, ask: "How bad is the itch?"    - MILD: doesn't interfere with normal activities   - MODERATE-SEVERE: interferes with work, school, sleep, or other activities      mild 7. SWELLING: "How big is the swelling?" (inches, cm, or compare to coins)     Ice is helping the swelling- size of red area 8. OTHER SYMPTOMS: "Do you have any other symptoms?"  (e.g., difficulty breathing, hives)     Thumb and fingers were tingling- but better not as intense now 9. PREGNANCY: "Is there any chance you are pregnant?" "When was your last menstrual period?"     No- LMP- IUD  Answer Assessment - Initial Assessment Questions 1. TYPE of SPIDER: "What type of spider was it?"  (e.g., name, unknown, or brief description)     Unsure 2. LOCATION: "Where is the bite located?"      wrist 3. PAIN: "Is there any pain?" If so, ask: "How bad is it?"  (Scale 1-10; or mild, moderate,  severe)     2 4. SWELLING: "How big is the swelling?" (Inches, cm or compare to coins)      diminishing with ice 5. ONSET: "When did the bite occur?" (Minutes or hours ago)      11:00 6. TETANUS: "When was the last tetanus booster?"      Current per patient 7. OTHER SYMPTOMS: "Do you have any other symptoms?"  (e.g., muscle cramps, abdominal pain, change in urine color)     Slight tingling in fingers- getting better now.  Protocols used: SPIDER BITE - [place]-A-AH, INSECT BITE-A-AH

## 2017-12-13 ENCOUNTER — Ambulatory Visit: Payer: BLUE CROSS/BLUE SHIELD | Admitting: Podiatry

## 2018-01-05 ENCOUNTER — Encounter: Payer: Self-pay | Admitting: Neurology

## 2018-01-08 ENCOUNTER — Encounter: Payer: Self-pay | Admitting: Podiatry

## 2018-01-08 ENCOUNTER — Ambulatory Visit (INDEPENDENT_AMBULATORY_CARE_PROVIDER_SITE_OTHER): Payer: BLUE CROSS/BLUE SHIELD | Admitting: Podiatry

## 2018-01-08 ENCOUNTER — Telehealth: Payer: Self-pay | Admitting: Neurology

## 2018-01-08 ENCOUNTER — Ambulatory Visit (INDEPENDENT_AMBULATORY_CARE_PROVIDER_SITE_OTHER): Payer: BLUE CROSS/BLUE SHIELD

## 2018-01-08 DIAGNOSIS — M21619 Bunion of unspecified foot: Secondary | ICD-10-CM

## 2018-01-08 DIAGNOSIS — M898X9 Other specified disorders of bone, unspecified site: Secondary | ICD-10-CM | POA: Diagnosis not present

## 2018-01-08 NOTE — Telephone Encounter (Signed)
Patient left message on the VM she needs to talk to someone about if she needs to keep her appt or not please call

## 2018-01-08 NOTE — Patient Instructions (Signed)
Pre-Operative Instructions  Congratulations, you have decided to take an important step towards improving your quality of life.  You can be assured that the doctors and staff at [facility] will be with you every step of the way.  Here are some important things you should know:  1. Plan to be at the surgery center/hospital at least 1 (one) hour prior to your scheduled time, unless otherwise directed by the surgical center/hospital staff.  You must have a responsible adult accompany you, remain during the surgery and drive you home.  Make sure you have directions to the surgical center/hospital to ensure you arrive on time. 2. If you are having surgery at [facility] or [facility] hospitals, you will need a copy of your medical history and physical form from your family physician within one month prior to the date of surgery. We will give you a form for your primary physician to complete.  3. We make every effort to accommodate the date you request for surgery.  However, there are times where surgery dates or times have to be moved.  We will contact you as soon as possible if a change in schedule is required.   4. No aspirin/ibuprofen for one week before surgery.  If you are on aspirin, any non-steroidal anti-inflammatory medications (Mobic, Aleve, Ibuprofen) should not be taken seven (7) days prior to your surgery.  You make take Tylenol for pain prior to surgery.  5. Medications - If you are taking daily heart and blood pressure medications, seizure, reflux, allergy, asthma, anxiety, pain or diabetes medications, make sure you notify the surgery center/hospital before the day of surgery so they can tell you which medications you should take or avoid the day of surgery. 6. No food or drink after midnight the night before surgery unless directed otherwise by surgical center/hospital staff. 7. No alcoholic beverages 24-hours prior to surgery.  No smoking 24-hours prior or 24-hours after  surgery. 8. Wear loose pants or shorts. They should be loose enough to fit over bandages, boots, and casts. 9. Don't wear slip-on shoes. Sneakers are preferred. 10. Bring your boot with you to the surgery center/hospital.  Also bring crutches or a walker if your physician has prescribed it for you.  If you do not have this equipment, it will be provided for you after surgery. 11. If you have not been contacted by the surgery center/hospital by the day before your surgery, call to confirm the date and time of your surgery. 12. Leave-time from work may vary depending on the type of surgery you have.  Appropriate arrangements should be made prior to surgery with your employer. 13. Prescriptions will be provided immediately following surgery by your doctor.  Fill these as soon as possible after surgery and take the medication as directed. Pain medications will not be refilled on weekends and must be approved by the doctor. 14. Remove nail polish on the operative foot and avoid getting pedicures prior to surgery. 15. Wash the night before surgery.  The night before surgery wash the foot and leg well with water and the antibacterial soap provided. Be sure to pay special attention to beneath the toenails and in between the toes.  Wash for at least three (3) minutes. Rinse thoroughly with water and dry well with a towel.  Perform this wash unless told not to do so by your physician.  Enclosed: 1 Ice pack (please put in freezer the night before surgery)   1 Hibiclens skin cleaner     Pre-op instructions  If you have any questions regarding the instructions, please do not hesitate to call our office.  [place]: [address], [place], [place] [address] -- [phone]  [place]: [address] [place], [place] [address] -- [phone]  [place]: [address]  [place], [place] [address] -- [phone]  [place]: [address], [address], [place], [place] [address] -- [phone]  Website:  [email or web address] 

## 2018-01-08 NOTE — Telephone Encounter (Signed)
Called and spoke with Pt. She has not had any further incidents. She will call if needed.

## 2018-01-09 NOTE — Progress Notes (Signed)
   Subjective: 32 year old female presenting today with a chief complaint of a painful bunion deformity to the right foot that has been symptomatic for the past 2-3 years. She reports the pain is worsening. She has not done anything for treatment. Certain shoes and walking increases the pain. Patient is here for further evaluation and treatment.   Past Medical History:  Diagnosis Date  . Anemia   . Anxiety   . Bell's palsy   . Migraines   . OCD (obsessive compulsive disorder)   . Pneumonia 03/2017  . Rectal prolapse       Objective: Physical Exam General: The patient is alert and oriented x3 in no acute distress.  Dermatology: Skin is cool, dry and supple bilateral lower extremities. Negative for open lesions or macerations.  Vascular: Palpable pedal pulses bilaterally. No edema or erythema noted. Capillary refill within normal limits.  Neurological: Epicritic and protective threshold grossly intact bilaterally.   Musculoskeletal Exam: Clinical evidence of bunion deformity noted to the respective foot. There is a moderate pain on palpation range of motion of the first MPJ. Lateral deviation of the hallux noted consistent with hallux abductovalgus.  Radiographic Exam: Increased intermetatarsal angle greater than 15 with a hallux abductus angle greater than 30 noted on AP view. Moderate degenerative changes noted within the first MPJ.  Exostosis noted to dorsal aspect of the 1st metatarsal/cuneiform right.   Assessment: 1. HAV w/ bunion deformity right 2. 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 Patient was evaluated. X-Rays reviewed. 2. Today we discussed the conservative versus surgical management of the presenting pathology. The patient opts for surgical management. All possible complications and details of the procedure were explained. All patient questions were answered. No guarantees were expressed or implied. 3. Authorization for surgery was initiated  today. Surgery will consist of bunionectomy with osteotomy right and exostectomy medial cuneiform right.  4. CAM boot dispensed.  5. Return to clinic one week post op.    Edrick Kins, DPM [facility]  Dr. Edrick Kins, [place]                                        [place], [place] [address]                Office [phone]  Fax [phone]

## 2018-01-11 DIAGNOSIS — F422 Mixed obsessional thoughts and acts: Secondary | ICD-10-CM | POA: Diagnosis not present

## 2018-02-13 DIAGNOSIS — F422 Mixed obsessional thoughts and acts: Secondary | ICD-10-CM | POA: Diagnosis not present

## 2018-02-15 ENCOUNTER — Encounter: Payer: Self-pay | Admitting: Podiatry

## 2018-02-15 DIAGNOSIS — D509 Iron deficiency anemia, unspecified: Secondary | ICD-10-CM | POA: Diagnosis not present

## 2018-02-15 DIAGNOSIS — M25571 Pain in right ankle and joints of right foot: Secondary | ICD-10-CM | POA: Diagnosis not present

## 2018-02-15 DIAGNOSIS — M21611 Bunion of right foot: Secondary | ICD-10-CM | POA: Diagnosis not present

## 2018-02-15 DIAGNOSIS — M2011 Hallux valgus (acquired), right foot: Secondary | ICD-10-CM | POA: Diagnosis not present

## 2018-02-15 DIAGNOSIS — M25774 Osteophyte, right foot: Secondary | ICD-10-CM | POA: Diagnosis not present

## 2018-02-15 DIAGNOSIS — D1631 Benign neoplasm of short bones of right lower limb: Secondary | ICD-10-CM | POA: Diagnosis not present

## 2018-02-16 ENCOUNTER — Telehealth: Payer: Self-pay

## 2018-02-16 ENCOUNTER — Telehealth: Payer: Self-pay | Admitting: *Deleted

## 2018-02-16 ENCOUNTER — Other Ambulatory Visit: Payer: Self-pay | Admitting: Podiatry

## 2018-02-16 ENCOUNTER — Telehealth: Payer: Self-pay | Admitting: Podiatry

## 2018-02-16 MED ORDER — OXYCODONE-ACETAMINOPHEN 10-325 MG PO TABS: 1.0000 | ORAL_TABLET | Freq: Three times a day (TID) | ORAL | 0 refills | Status: DC | PRN

## 2018-02-16 NOTE — Telephone Encounter (Signed)
Patient's husband called stating that his wife was in extreme pain and said she was having difficulty managing it. He stated that he removed her ace bandage as instructed and re-wrapped loosely, she is currently elevated and resting. She has been taking motrin as prescribed in between doses of percocet. Recently took 2 percocet at 11:45. He stated that his wife's teeth were chattering and she was having difficulty speaking. I did confirm that she was alert and oriented x 4, I could hear patient speaking clearly in the background stating that her foot was hurting. I did advise for her husband to take her for evaluation but he states that he thinks that she is not in need of a trip to the ED or our office. He is just concerned about her pain management. I told him to keep her at rest, both feet elevated, ice packs applied and head as low as he can to help get her through this initial response of shock. I can hear the patient say that the cold helps. Again I encouraged patient and husband to come to our clinic or go to the ED for evaluation but they stated that they are going to try to manage at home. He states her toes are pink and cap refill is normal, patient has been eating normally. Order obtained from Dr Logan BoresEvans and sent to pharmacy for Percocet 10/325mg  Q 4hrs prn. I will call this patient back in about an hour to monitor progress, the husband was instructed to call back sooner if symptoms worsen or go to the ED

## 2018-02-16 NOTE — Telephone Encounter (Signed)
Spoke with patient, who was alert and oriented. She did state that she was feeling much better but her pain was still present with slight improvement. Advised her to continue with Rx pain medication and layer in ibuprofen as directed, remain in boot and rest and elevate. She is to call or go to the ED with any acute symptom changes.

## 2018-02-16 NOTE — Telephone Encounter (Signed)
My wife had a bunion surgery yesterday morning with Dr. Logan BoresEvans. The nerve block started to wear off in the middle of the night. So we got her percocet going and she is on her 2nd pill now that she just took. The pain is not responding or its not keeping it down, its really overwhelming. We wanted to know if that is normal/should go away after today or we need to make some adjustment with her med's? Please give us a call back at [phone]. Thank you very much.

## 2018-02-16 NOTE — Telephone Encounter (Signed)
This is Yolanda ParodyBryce calling back on behalf of my wife Yolanda Kelly again. I called earlier about her recovery from her bunionectomy yesterday with Dr. Logan BoresEvans. She is just in unbearable pain and the percocet is just not getting the job done for her. We would really appreciate if someone would give Yolanda Kelly a call back at [phone].

## 2018-02-16 NOTE — Telephone Encounter (Signed)
Pt's husband called back and spoke with Christy S after speaking to [place], [place]. Stated that what he was instrucAlcus Dadted to do caused her to almost go into shock and that she almost fainted. I told Misty StanleyLisa what pt's husband stated to Alcus Dadhristy S and Misty StanleyLisa called Dr. Logan BoresEvans. Per Lisa's request, I sent Dr. Logan BoresEvans pt's phone number and he stated he would call her this evening. I told him that Lahoma CrockerJessica Q, RN is currently on the phone with the pt's husband now.

## 2018-02-16 NOTE — Telephone Encounter (Signed)
Patient called and wanted to talk about the pain she was having and I called the patient back and patient stated that the nerve block wore off and has been hanging the foot down which felt a little better and the pain medicine was taken at 9:15 am and was to take the ibuprofen at 12:15 pm and I stated that I would check with Dr Logan BoresEvans and I spoke with Dr Logan BoresEvans and he stated to call the patient back and to take two percocet now and then go back to the regular schedule and still do the ibuprofen as needed and to call the office if any concerns or questions at [phone]. Misty StanleyLisa

## 2018-02-19 ENCOUNTER — Ambulatory Visit: Payer: BLUE CROSS/BLUE SHIELD | Admitting: Neurology

## 2018-02-21 ENCOUNTER — Ambulatory Visit (INDEPENDENT_AMBULATORY_CARE_PROVIDER_SITE_OTHER): Payer: BLUE CROSS/BLUE SHIELD

## 2018-02-21 ENCOUNTER — Ambulatory Visit (INDEPENDENT_AMBULATORY_CARE_PROVIDER_SITE_OTHER): Payer: BLUE CROSS/BLUE SHIELD | Admitting: Podiatry

## 2018-02-21 DIAGNOSIS — M21611 Bunion of right foot: Secondary | ICD-10-CM

## 2018-02-21 DIAGNOSIS — M2041 Other hammer toe(s) (acquired), right foot: Secondary | ICD-10-CM

## 2018-02-21 DIAGNOSIS — Z9889 Other specified postprocedural states: Secondary | ICD-10-CM

## 2018-02-28 ENCOUNTER — Encounter: Payer: Self-pay | Admitting: Podiatry

## 2018-02-28 ENCOUNTER — Ambulatory Visit (INDEPENDENT_AMBULATORY_CARE_PROVIDER_SITE_OTHER): Payer: BLUE CROSS/BLUE SHIELD | Admitting: Podiatry

## 2018-02-28 DIAGNOSIS — M21611 Bunion of right foot: Secondary | ICD-10-CM

## 2018-02-28 DIAGNOSIS — Z9889 Other specified postprocedural states: Secondary | ICD-10-CM

## 2018-03-03 NOTE — Progress Notes (Signed)
   Subjective:  Patient presents today status post bunionectomy of the right foot. DOS: 02/15/18. She states she is doing well overall. She states her foot was wrapped too tightly after surgery and caused a red blister to the dorsum of the foot. She has been taking Tylenol for pain. She denies any modifying factors. Patient is here for further evaluation and treatment.    Past Medical History:  Diagnosis Date  . Anemia   . Anxiety   . Bell's palsy   . Migraines   . OCD (obsessive compulsive disorder)   . Pneumonia 03/2017  . Rectal prolapse       Objective/Physical Exam Neurovascular status intact.  Skin incisions appear to be well coapted with sutures and staples intact. No sign of infectious process noted. No dehiscence. No active bleeding noted. Moderate edema noted to the surgical extremity.  Radiographic Exam:  Orthopedic hardware and osteotomies sites appear to be stable with routine healing.  Assessment: 1. s/p bunionectomy right. DOS: 02/15/18   Plan of Care:  1. Patient was evaluated. X-rays reviewed 2. Dressing changed. Keep clean, dry and intact for one week.  3. Post op shoe dispensed. Weightbearing as tolerated. Discontinue using CAM boot.  4. Return to clinic in one week.    Felecia ShellingBrent M. Evans, DPM [facility]  Dr. Felecia ShellingBrent M. Evans, DPM    [address]                                        [place], [place] [address]                Office [phone]  Fax [phone]

## 2018-03-06 NOTE — Progress Notes (Signed)
   Subjective:  Patient presents today status post bunionectomy of the right foot. DOS: 02/15/18. She reports continued swelling with associated bruising. She reports a burning sensation. There are no modifying factors noted. Patient is here for further evaluation and treatment.    Past Medical History:  Diagnosis Date  . Anemia   . Anxiety   . Bell's palsy   . Migraines   . OCD (obsessive compulsive disorder)   . Pneumonia 03/2017  . Rectal prolapse       Objective/Physical Exam Neurovascular status intact.  Skin incisions appear to be well coapted with sutures and staples intact. No sign of infectious process noted. No dehiscence. No active bleeding noted. Moderate edema noted to the surgical extremity.  Assessment: 1. s/p bunionectomy right. DOS: 02/15/18   Plan of Care:  1. Patient was evaluated.  2. Dressing changed.  3. Sutures removed.  4. Continue weightbearing in CAM boot and post op shoe.  5. Return to clinic in 2 weeks to initiate physical therapy.     Felecia ShellingBrent M. Tonita Bills, DPM [facility]  Dr. Felecia ShellingBrent M. Lennart Gladish, DPM    [address]                                        [place], [place] [address]                Office [phone]  Fax [phone]

## 2018-03-14 ENCOUNTER — Ambulatory Visit (INDEPENDENT_AMBULATORY_CARE_PROVIDER_SITE_OTHER): Payer: BLUE CROSS/BLUE SHIELD

## 2018-03-14 ENCOUNTER — Encounter: Payer: Self-pay | Admitting: Podiatry

## 2018-03-14 ENCOUNTER — Ambulatory Visit (INDEPENDENT_AMBULATORY_CARE_PROVIDER_SITE_OTHER): Payer: BLUE CROSS/BLUE SHIELD | Admitting: Podiatry

## 2018-03-14 DIAGNOSIS — M21611 Bunion of right foot: Secondary | ICD-10-CM | POA: Diagnosis not present

## 2018-03-14 DIAGNOSIS — Z9889 Other specified postprocedural states: Secondary | ICD-10-CM

## 2018-03-14 MED ORDER — GENTAMICIN SULFATE 0.1 % EX CREA: 1.0000 "application " | TOPICAL_CREAM | Freq: Three times a day (TID) | CUTANEOUS | 1 refills | Status: DC

## 2018-03-19 ENCOUNTER — Telehealth: Payer: Self-pay | Admitting: Podiatry

## 2018-03-19 NOTE — Telephone Encounter (Signed)
Pt states a couple of days ago, there was a layer of dead skin covering the infected area, now the top is red and angry with yellow areas beneath the pocket, but no drainage or streaking red from the site, and no increased swelling. I told pt to continue with the gentamicin cream and dressing after cleansing, and to call with concerns. Pt states understanding.

## 2018-03-19 NOTE — Telephone Encounter (Signed)
I'm calling because I got a little infection going on with my recent surgery and I just have some questions because something weird is happening with it. My number is [phone]. Thanks so much.

## 2018-03-21 NOTE — Progress Notes (Signed)
   Subjective:  Patient presents today status post bunionectomy of the right foot. DOS: 02/15/18. She states that her foot feels uncomfortable. She reports continued swelling and notes redness around the incision site. Putting her foot down without elevation makes the symptoms worse. Patient is here for further evaluation and treatment.    Past Medical History:  Diagnosis Date  . Anemia   . Anxiety   . Bell's palsy   . Migraines   . OCD (obsessive compulsive disorder)   . Pneumonia 03/2017  . Rectal prolapse       Objective/Physical Exam Neurovascular status intact.  Skin incisions appear to be well coapted. No sign of infectious process noted. No dehiscence. No active bleeding noted. Moderate edema noted to the surgical extremity.  Radiographic Exam:  Orthopedic hardware and osteotomies sites appear to be stable with routine healing.  Assessment: 1. s/p bunionectomy right. DOS: 02/15/18   Plan of Care:  1. Patient was evaluated. X-Rays reviewed.  2. Prescription for gentamicin cream provided to patient to use daily on superficial ulceration with a bandage.  3. Continue wearing post op shoe.  4. Return to clinic in 2 weeks to begin wearing compression anklet and initiate physical therapy.      Felecia ShellingBrent M. Evans, DPM [facility]  Dr. Felecia ShellingBrent M. Evans, DPM    [address]                                        [place], [place] [address]                Office [phone]  Fax [phone]

## 2018-03-26 ENCOUNTER — Ambulatory Visit (INDEPENDENT_AMBULATORY_CARE_PROVIDER_SITE_OTHER): Payer: BLUE CROSS/BLUE SHIELD | Admitting: Podiatry

## 2018-03-26 DIAGNOSIS — Z9889 Other specified postprocedural states: Secondary | ICD-10-CM

## 2018-03-26 DIAGNOSIS — M21611 Bunion of right foot: Secondary | ICD-10-CM

## 2018-03-28 DIAGNOSIS — M25474 Effusion, right foot: Secondary | ICD-10-CM | POA: Diagnosis not present

## 2018-03-28 DIAGNOSIS — M25674 Stiffness of right foot, not elsewhere classified: Secondary | ICD-10-CM | POA: Diagnosis not present

## 2018-03-28 DIAGNOSIS — M25571 Pain in right ankle and joints of right foot: Secondary | ICD-10-CM | POA: Diagnosis not present

## 2018-03-28 DIAGNOSIS — M62571 Muscle wasting and atrophy, not elsewhere classified, right ankle and foot: Secondary | ICD-10-CM | POA: Diagnosis not present

## 2018-03-29 ENCOUNTER — Telehealth: Payer: Self-pay | Admitting: Podiatry

## 2018-03-29 NOTE — Telephone Encounter (Signed)
I have had a little infection going on with my incision site from my foot surgery. I was calling to see if I should be worried about something. You can call me back at [phone]. Thanks so much.

## 2018-03-29 NOTE — Telephone Encounter (Signed)
Pt states she had the infection for several weeks and had been getting better, but started PT yesterday and the area is red and actively draining pus. I told pt to continue Dr. Logan BoresEvans care instructions and gentamicin cream and I would get her an appt to come in tomorrow, transferred pt to schedulers.

## 2018-03-30 ENCOUNTER — Encounter: Payer: Self-pay | Admitting: Podiatry

## 2018-03-30 ENCOUNTER — Ambulatory Visit (INDEPENDENT_AMBULATORY_CARE_PROVIDER_SITE_OTHER): Payer: BLUE CROSS/BLUE SHIELD | Admitting: Podiatry

## 2018-03-30 VITALS — Temp 98.7°F

## 2018-03-30 DIAGNOSIS — L02619 Cutaneous abscess of unspecified foot: Secondary | ICD-10-CM

## 2018-03-30 DIAGNOSIS — L03119 Cellulitis of unspecified part of limb: Secondary | ICD-10-CM

## 2018-04-02 DIAGNOSIS — M25571 Pain in right ankle and joints of right foot: Secondary | ICD-10-CM | POA: Diagnosis not present

## 2018-04-02 DIAGNOSIS — M62571 Muscle wasting and atrophy, not elsewhere classified, right ankle and foot: Secondary | ICD-10-CM | POA: Diagnosis not present

## 2018-04-02 DIAGNOSIS — M25674 Stiffness of right foot, not elsewhere classified: Secondary | ICD-10-CM | POA: Diagnosis not present

## 2018-04-02 DIAGNOSIS — M25474 Effusion, right foot: Secondary | ICD-10-CM | POA: Diagnosis not present

## 2018-04-02 NOTE — Progress Notes (Signed)
Subjective:   Patient ID: Yolanda Kelly, female   DOB: 32 y.o.   MRN: [id number]   HPI Patient presents stating that she is concerned because she has a small amount of oozing at the end of her incision site on her bunion that was done several weeks ago.  She states it does not really hurt but can be irritated at times   ROS      Objective:  Physical Exam  Neurovascular status intact negative Homans sign noted with patient's right distal incision showing a small amount of dehiscence measuring approximately 4 mm in length by 2 mm in width with no erythema edema or drainage currently noted around it     Assessment:  Mild dehiscence distal aspect incision site right with good structural alignment of the first metatarsal and range of motion adequate     Plan:  Sterile debridement of the area accomplished with no active drainage noted.  I flushed the area and applied a small amount of Iodosorb with sterile dressing and instructed on soaks at home and bandage usage.  Gave strict instructions of any redness were to occur or other pathology to contact us immediately and she will see Dr. Logan BoresEvans next week.  X-ray indicates good alignment with joint congruous and no indications of current movement.  I did speak with this patient the next day and she did state that there was increase in the irritation and she felt there may have been some redness and I went ahead and is precautionary measure I called her in cephalexin 500 mg 3 times daily and I gave her strict instructions to call us if any further redness or drainage were to occur

## 2018-04-02 NOTE — Progress Notes (Signed)
   Subjective:  Patient presents today status post bunionectomy of the right foot. DOS: 02/15/18. She states she is doing much better and her condition is improving. She denies any pain or new complaints at this time. Patient is here for further evaluation and treatment.    Past Medical History:  Diagnosis Date  . Anemia   . Anxiety   . Bell's palsy   . Migraines   . OCD (obsessive compulsive disorder)   . Pneumonia 03/2017  . Rectal prolapse       Objective/Physical Exam Neurovascular status intact.  Skin incisions appear to be well coapted. No sign of infectious process noted. No dehiscence. No active bleeding noted. Moderate edema noted to the surgical extremity.   Assessment: 1. s/p bunionectomy right. DOS: 02/15/18   Plan of Care:  1. Patient was evaluated.  2. Continue using gentamicin cream daily with a bandage.  3. Orders for physical therapy three times a week for four weeks placed.  4. Recommended good shoe gear.  5. Return to clinic in 5 weeks.       Felecia ShellingBrent M. Tyleigh Mahn, DPM [facility]  Dr. Felecia ShellingBrent M. Debbora Ang, DPM    [address]                                        [place], [place] [address]                Office [phone]  Fax [phone](336) [phone]

## 2018-04-04 ENCOUNTER — Ambulatory Visit (INDEPENDENT_AMBULATORY_CARE_PROVIDER_SITE_OTHER): Payer: BLUE CROSS/BLUE SHIELD | Admitting: Podiatry

## 2018-04-04 DIAGNOSIS — M25674 Stiffness of right foot, not elsewhere classified: Secondary | ICD-10-CM | POA: Diagnosis not present

## 2018-04-04 DIAGNOSIS — M25571 Pain in right ankle and joints of right foot: Secondary | ICD-10-CM | POA: Diagnosis not present

## 2018-04-04 DIAGNOSIS — M62571 Muscle wasting and atrophy, not elsewhere classified, right ankle and foot: Secondary | ICD-10-CM | POA: Diagnosis not present

## 2018-04-04 DIAGNOSIS — M25474 Effusion, right foot: Secondary | ICD-10-CM | POA: Diagnosis not present

## 2018-04-04 DIAGNOSIS — Z9889 Other specified postprocedural states: Secondary | ICD-10-CM

## 2018-04-06 DIAGNOSIS — F422 Mixed obsessional thoughts and acts: Secondary | ICD-10-CM | POA: Diagnosis not present

## 2018-04-06 NOTE — Progress Notes (Signed)
   Subjective:  Patient presents today status post bunionectomy of the right foot. DOS: 02/15/18. She reports an improvement in her pain since seeing Dr. Charlsie Merlesegal last week. She has been using gentamicin cream daily with a bandage on the incision site. She has also been taking oral antibiotics that were recently prescribed for the ulceration noted to the incision site. She denies any modifying factors or new complaints. Patient is here for further evaluation and treatment.    Past Medical History:  Diagnosis Date  . Anemia   . Anxiety   . Bell's palsy   . Migraines   . OCD (obsessive compulsive disorder)   . Pneumonia 03/2017  . Rectal prolapse       Objective/Physical Exam Neurovascular status intact. Superficial ulcer noted to right foot distal incision measuring approximately 1.0 x 0.5 x 0.1 cm. No signs of infectious process. Minimal serous drainage noted. No malodor.    Assessment: 1. s/p bunionectomy of the right foot. DOS: 02/25/18 2. Ulceration of the right foot incision site   Plan of Care:  1. Patient was evaluated. 2. Light debridement performed with tissue nipper.  3. Continue using gentamicin cream daily with a bandage.  4. Finish oral antibiotics that were called in over the weekend.  5. Return to clinic in 3 weeks.    Felecia ShellingBrent M. Evans, DPM [facility]  Dr. Felecia ShellingBrent M. Evans, DPM    [address]                                        [place], [place] [address]                Office [phone]) [phone]  Fax [phone]

## 2018-04-10 ENCOUNTER — Telehealth: Payer: Self-pay | Admitting: *Deleted

## 2018-04-10 DIAGNOSIS — M62571 Muscle wasting and atrophy, not elsewhere classified, right ankle and foot: Secondary | ICD-10-CM | POA: Diagnosis not present

## 2018-04-10 DIAGNOSIS — M25474 Effusion, right foot: Secondary | ICD-10-CM | POA: Diagnosis not present

## 2018-04-10 DIAGNOSIS — M25571 Pain in right ankle and joints of right foot: Secondary | ICD-10-CM | POA: Diagnosis not present

## 2018-04-10 DIAGNOSIS — M25674 Stiffness of right foot, not elsewhere classified: Secondary | ICD-10-CM | POA: Diagnosis not present

## 2018-04-10 MED ORDER — GENTAMICIN SULFATE 0.1 % EX CREA: 1.0000 "application " | TOPICAL_CREAM | Freq: Three times a day (TID) | CUTANEOUS | 1 refills | Status: DC

## 2018-04-10 NOTE — Telephone Encounter (Signed)
Pt states she needs a refill of the antibiotic cream.

## 2018-04-10 NOTE — Telephone Encounter (Signed)
I informed pt the refill gentamicin cream had been sent to the [id number].

## 2018-04-12 DIAGNOSIS — M25571 Pain in right ankle and joints of right foot: Secondary | ICD-10-CM | POA: Diagnosis not present

## 2018-04-12 DIAGNOSIS — M25674 Stiffness of right foot, not elsewhere classified: Secondary | ICD-10-CM | POA: Diagnosis not present

## 2018-04-12 DIAGNOSIS — M62571 Muscle wasting and atrophy, not elsewhere classified, right ankle and foot: Secondary | ICD-10-CM | POA: Diagnosis not present

## 2018-04-12 DIAGNOSIS — M25474 Effusion, right foot: Secondary | ICD-10-CM | POA: Diagnosis not present

## 2018-04-17 DIAGNOSIS — M62571 Muscle wasting and atrophy, not elsewhere classified, right ankle and foot: Secondary | ICD-10-CM | POA: Diagnosis not present

## 2018-04-17 DIAGNOSIS — M25571 Pain in right ankle and joints of right foot: Secondary | ICD-10-CM | POA: Diagnosis not present

## 2018-04-17 DIAGNOSIS — M25674 Stiffness of right foot, not elsewhere classified: Secondary | ICD-10-CM | POA: Diagnosis not present

## 2018-04-17 DIAGNOSIS — M25474 Effusion, right foot: Secondary | ICD-10-CM | POA: Diagnosis not present

## 2018-04-19 DIAGNOSIS — M25674 Stiffness of right foot, not elsewhere classified: Secondary | ICD-10-CM | POA: Diagnosis not present

## 2018-04-19 DIAGNOSIS — M25571 Pain in right ankle and joints of right foot: Secondary | ICD-10-CM | POA: Diagnosis not present

## 2018-04-19 DIAGNOSIS — M25474 Effusion, right foot: Secondary | ICD-10-CM | POA: Diagnosis not present

## 2018-04-19 DIAGNOSIS — M62571 Muscle wasting and atrophy, not elsewhere classified, right ankle and foot: Secondary | ICD-10-CM | POA: Diagnosis not present

## 2018-04-27 DIAGNOSIS — M25674 Stiffness of right foot, not elsewhere classified: Secondary | ICD-10-CM | POA: Diagnosis not present

## 2018-04-27 DIAGNOSIS — M62571 Muscle wasting and atrophy, not elsewhere classified, right ankle and foot: Secondary | ICD-10-CM | POA: Diagnosis not present

## 2018-04-27 DIAGNOSIS — M25474 Effusion, right foot: Secondary | ICD-10-CM | POA: Diagnosis not present

## 2018-04-27 DIAGNOSIS — M25571 Pain in right ankle and joints of right foot: Secondary | ICD-10-CM | POA: Diagnosis not present

## 2018-04-30 ENCOUNTER — Encounter: Payer: BLUE CROSS/BLUE SHIELD | Admitting: Podiatry

## 2018-05-02 ENCOUNTER — Encounter: Payer: BLUE CROSS/BLUE SHIELD | Admitting: Podiatry

## 2018-05-07 ENCOUNTER — Ambulatory Visit (INDEPENDENT_AMBULATORY_CARE_PROVIDER_SITE_OTHER): Payer: BLUE CROSS/BLUE SHIELD

## 2018-05-07 ENCOUNTER — Ambulatory Visit (INDEPENDENT_AMBULATORY_CARE_PROVIDER_SITE_OTHER): Payer: BLUE CROSS/BLUE SHIELD | Admitting: Podiatry

## 2018-05-07 DIAGNOSIS — M2041 Other hammer toe(s) (acquired), right foot: Secondary | ICD-10-CM

## 2018-05-07 DIAGNOSIS — Z9889 Other specified postprocedural states: Secondary | ICD-10-CM

## 2018-05-07 DIAGNOSIS — M21611 Bunion of right foot: Secondary | ICD-10-CM

## 2018-05-09 NOTE — Progress Notes (Signed)
   Subjective:  Patient presents today status post bunionectomy of the right foot. DOS: 02/15/18. She is also here for follow up evaluation of an ulceration of the right foot incision. She states she is doing well and the wound has improved significantly. She denies any pain or new complaints at this time. Patient is here for further evaluation and treatment.    Past Medical History:  Diagnosis Date  . Anemia   . Anxiety   . Bell's palsy   . Migraines   . OCD (obsessive compulsive disorder)   . Pneumonia 03/2017  . Rectal prolapse       Objective/Physical Exam Neurovascular status intact. Skin incisions appear to be well coapted. No sign of infectious process noted. No dehiscence. No active bleeding noted. Moderate edema noted to the surgical extremity.  Wound noted to the right foot incision site has healed. Complete re-epithelialization has occurred. No drainage noted.    Assessment: 1. s/p bunionectomy of the right foot. DOS: 02/25/18 2. Ulceration of the right foot incision site - healed    Plan of Care:  1. Patient was evaluated. 2. Recommended Mederma scar cream.  3. May resume full activity with no restrictions.  4. Return to clinic as needed.    Felecia ShellingBrent M. Roiza Wiedel, DPM [facility]  Dr. Felecia ShellingBrent M. Fraya Ueda, DPM    [address]                                        [place], [place] [address]                Office [phone]  Fax [phone]

## 2018-05-10 DIAGNOSIS — M25474 Effusion, right foot: Secondary | ICD-10-CM | POA: Diagnosis not present

## 2018-05-10 DIAGNOSIS — M62571 Muscle wasting and atrophy, not elsewhere classified, right ankle and foot: Secondary | ICD-10-CM | POA: Diagnosis not present

## 2018-05-10 DIAGNOSIS — M25571 Pain in right ankle and joints of right foot: Secondary | ICD-10-CM | POA: Diagnosis not present

## 2018-05-10 DIAGNOSIS — M25674 Stiffness of right foot, not elsewhere classified: Secondary | ICD-10-CM | POA: Diagnosis not present

## 2018-05-11 DIAGNOSIS — M25474 Effusion, right foot: Secondary | ICD-10-CM | POA: Diagnosis not present

## 2018-05-11 DIAGNOSIS — M25571 Pain in right ankle and joints of right foot: Secondary | ICD-10-CM | POA: Diagnosis not present

## 2018-05-11 DIAGNOSIS — M62571 Muscle wasting and atrophy, not elsewhere classified, right ankle and foot: Secondary | ICD-10-CM | POA: Diagnosis not present

## 2018-05-11 DIAGNOSIS — M25674 Stiffness of right foot, not elsewhere classified: Secondary | ICD-10-CM | POA: Diagnosis not present

## 2018-05-15 DIAGNOSIS — F422 Mixed obsessional thoughts and acts: Secondary | ICD-10-CM | POA: Diagnosis not present

## 2018-05-21 ENCOUNTER — Encounter: Payer: BLUE CROSS/BLUE SHIELD | Admitting: Physician Assistant

## 2018-05-21 DIAGNOSIS — F422 Mixed obsessional thoughts and acts: Secondary | ICD-10-CM | POA: Diagnosis not present

## 2018-05-23 ENCOUNTER — Encounter: Payer: Self-pay | Admitting: Physician Assistant

## 2018-05-23 ENCOUNTER — Ambulatory Visit (INDEPENDENT_AMBULATORY_CARE_PROVIDER_SITE_OTHER): Payer: BLUE CROSS/BLUE SHIELD | Admitting: Physician Assistant

## 2018-05-23 VITALS — BP 110/76 | HR 64 | Temp 98.4°F | Ht 62.5 in | Wt 147.4 lb

## 2018-05-23 DIAGNOSIS — Z23 Encounter for immunization: Secondary | ICD-10-CM

## 2018-05-23 DIAGNOSIS — D509 Iron deficiency anemia, unspecified: Secondary | ICD-10-CM | POA: Diagnosis not present

## 2018-05-23 DIAGNOSIS — Z0000 Encounter for general adult medical examination without abnormal findings: Secondary | ICD-10-CM | POA: Diagnosis not present

## 2018-05-23 DIAGNOSIS — F429 Obsessive-compulsive disorder, unspecified: Secondary | ICD-10-CM | POA: Diagnosis not present

## 2018-05-23 DIAGNOSIS — Z13220 Encounter for screening for lipoid disorders: Secondary | ICD-10-CM | POA: Diagnosis not present

## 2018-05-23 DIAGNOSIS — F419 Anxiety disorder, unspecified: Secondary | ICD-10-CM

## 2018-05-23 DIAGNOSIS — E663 Overweight: Secondary | ICD-10-CM

## 2018-05-23 DIAGNOSIS — Z136 Encounter for screening for cardiovascular disorders: Secondary | ICD-10-CM

## 2018-05-23 DIAGNOSIS — G4700 Insomnia, unspecified: Secondary | ICD-10-CM | POA: Insufficient documentation

## 2018-05-23 LAB — COMPREHENSIVE METABOLIC PANEL
ALT: 12 U/L (ref 0–35)
AST: 14 U/L (ref 0–37)
Albumin: 4.4 g/dL (ref 3.5–5.2)
Alkaline Phosphatase: 72 U/L (ref 39–117)
BILIRUBIN TOTAL: 0.4 mg/dL (ref 0.2–1.2)
BUN: 15 mg/dL (ref 6–23)
CO2: 26 mEq/L (ref 19–32)
Calcium: 9.4 mg/dL (ref 8.4–10.5)
Chloride: 104 mEq/L (ref 96–112)
Creatinine, Ser: 0.67 mg/dL (ref 0.40–1.20)
GFR: 106.72 mL/min (ref >60.00)
Glucose, Bld: 86 mg/dL (ref 70–99)
Potassium: 3.9 mEq/L (ref 3.5–5.1)
Sodium: 137 mEq/L (ref 135–145)
Total Protein: 7.5 g/dL (ref 6.0–8.3)

## 2018-05-23 LAB — CBC WITH DIFFERENTIAL/PLATELET
BASOS ABS: 0.1 10*3/uL (ref 0.0–0.1)
Basophils Relative: 0.4 % (ref 0.0–3.0)
Eosinophils Absolute: 0.1 10*3/uL (ref 0.0–0.7)
Eosinophils Relative: 1.1 % (ref 0.0–5.0)
HEMATOCRIT: 38.5 % (ref 36.0–46.0)
Hemoglobin: 12 g/dL (ref 12.0–15.0)
LYMPHS PCT: 17.4 % (ref 12.0–46.0)
Lymphs Abs: 2 10*3/uL (ref 0.7–4.0)
MCHC: 31.2 g/dL (ref 30.0–36.0)
MCV: 70.5 fl — AB (ref 78.0–100.0)
MONOS PCT: 4.8 % (ref 3.0–12.0)
Monocytes Absolute: 0.6 10*3/uL (ref 0.1–1.0)
NEUTROS ABS: 9 10*3/uL — AB (ref 1.4–7.7)
Neutrophils Relative %: 76.3 % (ref 43.0–77.0)
Platelets: 328 10*3/uL (ref 150.0–400.0)
RBC: 5.46 Mil/uL — ABNORMAL HIGH (ref 3.87–5.11)
RDW: 14.3 % (ref 11.5–15.5)
WBC: 11.8 10*3/uL — ABNORMAL HIGH (ref 4.0–10.5)

## 2018-05-23 LAB — LIPID PANEL
CHOL/HDL RATIO: 3
Cholesterol: 111 mg/dL (ref 0–200)
HDL: 42.3 mg/dL (ref >39.00)
LDL Cholesterol: 49 mg/dL (ref 0–99)
NonHDL: 68.2
TRIGLYCERIDES: 94 mg/dL (ref 0.0–149.0)
VLDL: 18.8 mg/dL (ref 0.0–40.0)

## 2018-05-23 NOTE — Progress Notes (Signed)
I acted as a Neurosurgeon for [name], PA-C Corky Mull, LPN  Subjective:    Saleemah Mollenhauer is a 32 y.o. female and is here for a comprehensive physical exam.  HPI  There are no preventive care reminders to display for this patient.  Acute Concerns: none  Chronic Issues: Migraine -- currently, most well controlled, will have to take Excedrin Migraine Insomnia -- has tried melatonin and OTC sleeping pills, now on Xanax -- has been on for a few days now, not sure it is working OCD and Anxiety -- continues to work with psychiatrist at [facility], currently not on any medication other than Xanax Iron deficiency anemia -- denies any symptoms of lightheadedness, dizziness, palpitations. Periods are not extremely heavy. Takes iron supplement daily.  Health Maintenance: Immunizations -- UTD, Flu vaccine given today Colonoscopy -- N/A Mammogram -- N/A PAP -- due, scheduled with GYN next week Bone Density -- N/A Diet -- gave up sugar a few weeks ago Caffeine intake -- none caffeine Sleep habits -- working with therapist Exercise -- trying to get back into it, currently still recovering from foot surgery Current Weight -- Weight: 147 lb 6.1 oz (66.9 kg)  Weight History: Wt Readings from Last 10 Encounters:  05/23/18 147 lb 6.1 oz (66.9 kg)  11/03/17 145 lb (65.8 kg)  08/31/17 147 lb (66.7 kg)  05/09/17 148 lb (67.1 kg)  05/08/17 147 lb 4.8 oz (66.8 kg)  04/26/17 143 lb 4 oz (65 kg)  04/07/17 140 lb (63.5 kg)  04/03/17 143 lb 4 oz (65 kg)  03/29/17 141 lb (64 kg)    Period characteristics -- normal, still gets periods with IUD Birth control -- getting IUD out next week, husband is planning vasectomy soon  Depression screen PHQ 2/9 05/23/2018  Decreased Interest 0  Down, Depressed, Hopeless 0  PHQ - 2 Score 0    Other providers/specialists: [name] at [facility]    PMHx, SurgHx, SocialHx, Medications, and Allergies were reviewed in the Visit Navigator and  updated as appropriate.   Past Medical History:  Diagnosis Date  . Anemia   . Anxiety   . Bell's palsy   . Migraines   . OCD (obsessive compulsive disorder)   . Pneumonia 03/2017  . Rectal prolapse      Past Surgical History:  Procedure Laterality Date  . NO PAST SURGERIES       Family History  Problem Relation Age of Onset  . Thyroid disease Mother   . Kidney cancer Mother   . Asthma Father   . Irritable bowel syndrome Father   . Heart disease Sister   . Diabetes Mellitus II Maternal Grandmother   . Mental illness Maternal Grandfather   . Glaucoma Paternal Grandfather   . Celiac disease Sister   . Celiac disease Son        tests high for it, not 100% sure he has it  . Breast cancer Maternal Aunt   . Colon cancer Neg Hx   . Throat cancer Neg Hx   . Rectal cancer Neg Hx     Social History   Tobacco Use  . Smoking status: Never Smoker  . Smokeless tobacco: Never Used  Substance Use Topics  . Alcohol use: No  . Drug use: No    Review of Systems:   Review of Systems  Constitutional: Negative.  Negative for chills, fever, malaise/fatigue and weight loss.  HENT: Negative for hearing loss, sinus pain and sore throat.   Eyes:  Negative.  Negative for blurred vision.  Respiratory: Negative.  Negative for cough and shortness of breath.   Cardiovascular: Negative.  Negative for chest pain, palpitations and leg swelling.  Gastrointestinal: Negative.  Negative for abdominal pain, constipation, diarrhea, heartburn, nausea and vomiting.  Genitourinary: Negative.  Negative for dysuria, frequency and urgency.  Musculoskeletal: Negative.  Negative for back pain, myalgias and neck pain.  Skin: Negative.  Negative for itching and rash.  Neurological: Negative.  Negative for dizziness, tingling, seizures, loss of consciousness and headaches.  Endo/Heme/Allergies: Negative for polydipsia.  Psychiatric/Behavioral: Negative for depression. The patient is nervous/anxious and has  insomnia.        Pt sees therapist at [facility].    Objective:   BP 110/76 (BP Location: Left Arm, Patient Position: Sitting, Cuff Size: Normal)   Pulse 64   Temp 98.4 F (36.9 C) (Oral)   Ht 5' 2.5" (1.588 m)   Wt 147 lb 6.1 oz (66.9 kg)   SpO2 99%   BMI 26.53 kg/m   General Appearance:    Alert, cooperative, no distress, appears stated age  Head:    Normocephalic, without obvious abnormality, atraumatic  Eyes:    PERRL, conjunctiva/corneas clear, EOM's intact, fundi    benign, both eyes  Ears:    Normal TM's and external ear canals, both ears  Nose:   Nares normal, septum midline, mucosa normal, no drainage    or sinus tenderness  Throat:   Lips, mucosa, and tongue normal; teeth and gums normal  Neck:   Supple, symmetrical, trachea midline, no adenopathy;    thyroid:  no enlargement/tenderness/nodules; no carotid   bruit or JVD  Back:     Symmetric, no curvature, ROM normal, no CVA tenderness  Lungs:     Clear to auscultation bilaterally, respirations unlabored  Chest Wall:    No tenderness or deformity   Heart:    Regular rate and rhythm, S1 and S2 normal, no murmur, rub   or gallop  Breast Exam:    Deferred  Abdomen:     Soft, non-tender, bowel sounds active all four quadrants,    no masses, no organomegaly  Genitalia:    Deferred  Rectal:    Deferred  Extremities:   Extremities normal, atraumatic, no cyanosis or edema  Pulses:   2+ and symmetric all extremities  Skin:   Skin color, texture, turgor normal, no rashes or lesions  Lymph nodes:   Cervical, supraclavicular, and axillary nodes normal  Neurologic:   CNII-XII intact, normal strength, sensation and reflexes    throughout    Assessment/Plan:   Mckenzi was seen today for annual exam.  Diagnoses and all orders for this visit:  Routine physical examination Today patient counseled on age appropriate routine health concerns for screening and prevention, each reviewed and up to date or declined.  Immunizations reviewed and up to date or declined. Labs ordered and reviewed. Risk factors for depression reviewed and negative. Hearing function and visual acuity are intact. ADLs screened and addressed as needed. Functional ability and level of safety reviewed and appropriate. Education, counseling and referrals performed based on assessed risks today. Patient provided with a copy of personalized plan for preventive services.  Need for prophylactic vaccination and inoculation against influenza -     Flu Vaccine QUAD 36+ mos IM  Iron deficiency anemia, unspecified iron deficiency anemia type Asymptomatic. Continues on iron supplement daily. Will re-check labs. -     CBC with Differential/Platelet -  Comprehensive metabolic panel  Encounter for lipid screening for cardiovascular disease -     Lipid panel  Obsessive-compulsive disorder, unspecified type; Anxiety; Insomnia, unspecified type Management per psychiatry. -     CBC with Differential/Platelet -     Comprehensive metabolic panel   Well Adult Exam: Labs ordered: Yes. Patient counseling was done. See below for items discussed. Discussed the patient's BMI. The BMI BMI is not in the acceptable range; BMI management plan is completed Follow up as needed for acute illness.  Patient Counseling:   [x]     Nutrition: Stressed importance of moderation in sodium/caffeine intake, saturated fat and cholesterol, caloric balance, sufficient intake of fresh fruits, vegetables, fiber, calcium, iron, and 1 mg of folate supplement per day (for females capable of pregnancy).   [x]      Stressed the importance of regular exercise.    [x]     Substance Abuse: Discussed cessation/primary prevention of tobacco, alcohol, or other drug use; driving or other dangerous activities under the influence; availability of treatment for abuse.    [x]      Injury prevention: Discussed safety belts, safety helmets, smoke detector, smoking near bedding or  upholstery.    [x]      Sexuality: Discussed sexually transmitted diseases, partner selection, use of condoms, avoidance of unintended pregnancy  and contraceptive alternatives.    [x]     Dental health: Discussed importance of regular tooth brushing, flossing, and dental visits.   [x]      Health maintenance and immunizations reviewed. Please refer to Health maintenance section.   CMA or LPN served as scribe during this visit. History, Physical, and Plan performed by medical provider. The above documentation has been reviewed and is accurate and complete.   [name], PA-C [facility]

## 2018-05-23 NOTE — Patient Instructions (Signed)
It was great to see you!  Please go to the lab for blood work.   Our office will call you with your results unless you have chosen to receive results via MyChart.  If your blood work is normal we will follow-up each year for physicals and as scheduled for chronic medical problems.  If anything is abnormal we will treat accordingly and get you in for a follow-up.  Take care,  [name] Maintenance, Female Adopting a healthy lifestyle and getting preventive care can go a long way to promote health and wellness. Talk with your health care provider about what schedule of regular examinations is right for you. This is a good chance for you to check in with your provider about disease prevention and staying healthy. In between checkups, there are plenty of things you can do on your own. Experts have done a lot of research about which lifestyle changes and preventive measures are most likely to keep you healthy. Ask your health care provider for more information. Weight and diet Eat a healthy diet  Be sure to include plenty of vegetables, fruits, low-fat dairy products, and lean protein.  Do not eat a lot of foods high in solid fats, added sugars, or salt.  Get regular exercise. This is one of the most important things you can do for your health. ? Most adults should exercise for at least 150 minutes each week. The exercise should increase your heart rate and make you sweat (moderate-intensity exercise). ? Most adults should also do strengthening exercises at least twice a week. This is in addition to the moderate-intensity exercise.  Maintain a healthy weight  Body mass index (BMI) is a measurement that can be used to identify possible weight problems. It estimates body fat based on height and weight. Your health care provider can help determine your BMI and help you achieve or maintain a healthy weight.  For females 57 years of age and older: ? A BMI below 18.5 is considered  underweight. ? A BMI of 18.5 to 24.9 is normal. ? A BMI of 25 to 29.9 is considered overweight. ? A BMI of 30 and above is considered obese.  Watch levels of cholesterol and blood lipids  You should start having your blood tested for lipids and cholesterol at 34 years of age, then have this test every 5 years.  You may need to have your cholesterol levels checked more often if: ? Your lipid or cholesterol levels are high. ? You are older than 34 years of age. ? You are at high risk for heart disease.  Cancer screening Lung Cancer  Lung cancer screening is recommended for adults 29-37 years old who are at high risk for lung cancer because of a history of smoking.  A yearly low-dose CT scan of the lungs is recommended for people who: ? Currently smoke. ? Have quit within the past 15 years. ? Have at least a 30-pack-year history of smoking. A pack year is smoking an average of one pack of cigarettes a day for 1 year.  Yearly screening should continue until it has been 15 years since you quit.  Yearly screening should stop if you develop a health problem that would prevent you from having lung cancer treatment.  Breast Cancer  Practice breast self-awareness. This means understanding how your breasts normally appear and feel.  It also means doing regular breast self-exams. Let your health care provider know about any changes, no matter how small.  If  If you are in your 20s or 30s, you should have a clinical breast exam (CBE) by a health care provider every 1-3 years as part of a regular health exam.  If you are 40 or older, have a CBE every year. Also consider having a breast X-ray (mammogram) every year.  If you have a family history of breast cancer, talk to your health care provider about genetic screening.  If you are at high risk for breast cancer, talk to your health care provider about having an MRI and a mammogram every year.  Breast cancer gene (BRCA) assessment is  recommended for women who have family members with BRCA-related cancers. BRCA-related cancers include: ? Breast. ? Ovarian. ? Tubal. ? Peritoneal cancers.  Results of the assessment will determine the need for genetic counseling and BRCA1 and BRCA2 testing.  Cervical Cancer Your health care provider may recommend that you be screened regularly for cancer of the pelvic organs (ovaries, uterus, and vagina). This screening involves a pelvic examination, including checking for microscopic changes to the surface of your cervix (Pap test). You may be encouraged to have this screening done every 3 years, beginning at age 21.  For women ages 30-65, health care providers may recommend pelvic exams and Pap testing every 3 years, or they may recommend the Pap and pelvic exam, combined with testing for human papilloma virus (HPV), every 5 years. Some types of HPV increase your risk of cervical cancer. Testing for HPV may also be done on women of any age with unclear Pap test results.  Other health care providers may not recommend any screening for nonpregnant women who are considered low risk for pelvic cancer and who do not have symptoms. Ask your health care provider if a screening pelvic exam is right for you.  If you have had past treatment for cervical cancer or a condition that could lead to cancer, you need Pap tests and screening for cancer for at least 20 years after your treatment. If Pap tests have been discontinued, your risk factors (such as having a new sexual partner) need to be reassessed to determine if screening should resume. Some women have medical problems that increase the chance of getting cervical cancer. In these cases, your health care provider may recommend more frequent screening and Pap tests.  Colorectal Cancer  This type of cancer can be detected and often prevented.  Routine colorectal cancer screening usually begins at 34 years of age and continues through 34 years of  age.  Your health care provider may recommend screening at an earlier age if you have risk factors for colon cancer.  Your health care provider may also recommend using home test kits to check for hidden blood in the stool.  A small camera at the end of a tube can be used to examine your colon directly (sigmoidoscopy or colonoscopy). This is done to check for the earliest forms of colorectal cancer.  Routine screening usually begins at age 50.  Direct examination of the colon should be repeated every 5-10 years through 34 years of age. However, you may need to be screened more often if early forms of precancerous polyps or small growths are found.  Skin Cancer  Check your skin from head to toe regularly.  Tell your health care provider about any new moles or changes in moles, especially if there is a change in a mole's shape or color.  Also tell your health care provider if you have a mole that is   than the size of a pencil eraser.  Always use sunscreen. Apply sunscreen liberally and repeatedly throughout the day.  Protect yourself by wearing long sleeves, pants, a wide-brimmed hat, and sunglasses whenever you are outside.  Heart disease, diabetes, and high blood pressure  High blood pressure causes heart disease and increases the risk of stroke. High blood pressure is more likely to develop in: ? People who have blood pressure in the high end of the normal range (130-139/85-89 mm Hg). ? People who are overweight or obese. ? People who are African American.  If you are 69-60 years of age, have your blood pressure checked every 3-5 years. If you are 23 years of age or older, have your blood pressure checked every year. You should have your blood pressure measured twice-once when you are at a hospital or clinic, and once when you are not at a hospital or clinic. Record the average of the two measurements. To check your blood pressure when you are not at a hospital or clinic, you  can use: ? An automated blood pressure machine at a pharmacy. ? A home blood pressure monitor.  If you are between 87 years and 6 years old, ask your health care provider if you should take aspirin to prevent strokes.  Have regular diabetes screenings. This involves taking a blood sample to check your fasting blood sugar level. ? If you are at a normal weight and have a low risk for diabetes, have this test once every three years after 34 years of age. ? If you are overweight and have a high risk for diabetes, consider being tested at a younger age or more often. Preventing infection Hepatitis B  If you have a higher risk for hepatitis B, you should be screened for this virus. You are considered at high risk for hepatitis B if: ? You were born in a country where hepatitis B is common. Ask your health care provider which countries are considered high risk. ? Your parents were born in a high-risk country, and you have not been immunized against hepatitis B (hepatitis B vaccine). ? You have HIV or AIDS. ? You use needles to inject street drugs. ? You live with someone who has hepatitis B. ? You have had sex with someone who has hepatitis B. ? You get hemodialysis treatment. ? You take certain medicines for conditions, including cancer, organ transplantation, and autoimmune conditions.  Hepatitis C  Blood testing is recommended for: ? Everyone born from [birth year] through 1967. ? Anyone with known risk factors for hepatitis C.  Sexually transmitted infections (STIs)  You should be screened for sexually transmitted infections (STIs) including gonorrhea and chlamydia if: ? You are sexually active and are younger than 34 years of age. ? You are older than 34 years of age and your health care provider tells you that you are at risk for this type of infection. ? Your sexual activity has changed since you were last screened and you are at an increased risk for chlamydia or gonorrhea. Ask your  health care provider if you are at risk.  If you do not have HIV, but are at risk, it may be recommended that you take a prescription medicine daily to prevent HIV infection. This is called pre-exposure prophylaxis (PrEP). You are considered at risk if: ? You are sexually active and do not regularly use condoms or know the HIV status of your partner(s). ? You take drugs by injection. ? You are sexually active with  with a partner who has HIV.  Talk with your health care provider about whether you are at high risk of being infected with HIV. If you choose to begin PrEP, you should first be tested for HIV. You should then be tested every 3 months for as long as you are taking PrEP. Pregnancy  If you are premenopausal and you may become pregnant, ask your health care provider about preconception counseling.  If you may become pregnant, take 400 to 800 micrograms (mcg) of folic acid every day.  If you want to prevent pregnancy, talk to your health care provider about birth control (contraception). Osteoporosis and menopause  Osteoporosis is a disease in which the bones lose minerals and strength with aging. This can result in serious bone fractures. Your risk for osteoporosis can be identified using a bone density scan.  If you are 65 years of age or older, or if you are at risk for osteoporosis and fractures, ask your health care provider if you should be screened.  Ask your health care provider whether you should take a calcium or vitamin D supplement to lower your risk for osteoporosis.  Menopause may have certain physical symptoms and risks.  Hormone replacement therapy may reduce some of these symptoms and risks. Talk to your health care provider about whether hormone replacement therapy is right for you. Follow these instructions at home:  Schedule regular health, dental, and eye exams.  Stay current with your immunizations.  Do not use any tobacco products including cigarettes, chewing  tobacco, or electronic cigarettes.  If you are pregnant, do not drink alcohol.  If you are breastfeeding, limit how much and how often you drink alcohol.  Limit alcohol intake to no more than 1 drink per day for nonpregnant women. One drink equals 12 ounces of beer, 5 ounces of wine, or 1 ounces of hard liquor.  Do not use street drugs.  Do not share needles.  Ask your health care provider for help if you need support or information about quitting drugs.  Tell your health care provider if you often feel depressed.  Tell your health care provider if you have ever been abused or do not feel safe at home. This information is not intended to replace advice given to you by your health care provider. Make sure you discuss any questions you have with your health care provider. Document Released: 03/14/2011 Document Revised: 02/04/2016 Document Reviewed: 06/02/2015 Elsevier Interactive Patient Education  2018 [employer]vier Inc.  

## 2018-05-30 NOTE — Progress Notes (Signed)
DOS: 02-15-2018 Bunionectomy with metatarsal osteotomy Right foot; Exostectomy medial cunciform Right Foot  [facility]

## 2018-05-31 DIAGNOSIS — Z6827 Body mass index (BMI) 27.0-27.9, adult: Secondary | ICD-10-CM | POA: Diagnosis not present

## 2018-05-31 DIAGNOSIS — Z01419 Encounter for gynecological examination (general) (routine) without abnormal findings: Secondary | ICD-10-CM | POA: Diagnosis not present

## 2018-05-31 DIAGNOSIS — Z30432 Encounter for removal of intrauterine contraceptive device: Secondary | ICD-10-CM | POA: Diagnosis not present

## 2018-06-15 ENCOUNTER — Telehealth: Payer: Self-pay | Admitting: Physician Assistant

## 2018-06-15 NOTE — Telephone Encounter (Signed)
Not sure if xanax is working. Would like to talk to you.

## 2018-06-15 NOTE — Telephone Encounter (Signed)
Will contact pt for more details.

## 2018-06-18 ENCOUNTER — Other Ambulatory Visit: Payer: Self-pay

## 2018-06-18 MED ORDER — TRAZODONE HCL 50 MG PO TABS: ORAL_TABLET | ORAL | 0 refills | Status: DC

## 2018-06-18 NOTE — Telephone Encounter (Signed)
Left voicemail to get more information 

## 2018-06-18 NOTE — Telephone Encounter (Signed)
She's aware and would like to try trazodone. [facility] [address]. Will send rx

## 2018-06-18 NOTE — Telephone Encounter (Signed)
She can take 1-2 Xanax at hs if needed to help with sleep.  Or add Trazodone 50 mg, 1/2-1 qhs prn.  Ok to call in 30, no RF if she wants to try that.

## 2018-06-18 NOTE — Telephone Encounter (Signed)
Pt called back stating medication increase not really helping with her sleep, but she's had a decrease in panic attacks throughout the day. Has ov 07/03/2018.  Please advise.   Thank you Rosey Bath!

## 2018-06-25 DIAGNOSIS — F411 Generalized anxiety disorder: Secondary | ICD-10-CM | POA: Insufficient documentation

## 2018-06-25 DIAGNOSIS — F422 Mixed obsessional thoughts and acts: Secondary | ICD-10-CM | POA: Insufficient documentation

## 2018-07-03 ENCOUNTER — Ambulatory Visit: Payer: Self-pay | Admitting: Psychiatry

## 2018-07-04 ENCOUNTER — Ambulatory Visit: Payer: Self-pay | Admitting: Physician Assistant

## 2018-07-24 ENCOUNTER — Encounter: Payer: Self-pay | Admitting: Physician Assistant

## 2018-07-24 ENCOUNTER — Ambulatory Visit (INDEPENDENT_AMBULATORY_CARE_PROVIDER_SITE_OTHER): Payer: BLUE CROSS/BLUE SHIELD | Admitting: Physician Assistant

## 2018-07-24 VITALS — BP 108/72 | HR 93 | Temp 98.1°F | Ht 62.5 in | Wt 150.5 lb

## 2018-07-24 DIAGNOSIS — H9203 Otalgia, bilateral: Secondary | ICD-10-CM | POA: Diagnosis not present

## 2018-07-24 NOTE — Progress Notes (Signed)
Yolanda Kelly is a 32 y.o. female here for a new problem.  I acted as a Neurosurgeon for [name], PA-C Corky Mull, LPN  History of Present Illness:   Chief Complaint  Patient presents with  . Otalgia    Both ears rt ear worst x 3 days    Otalgia   There is pain in both (RIght >Left) ears. This is a new problem. Episode onset: Started several days ago. The problem has been gradually worsening. There has been no fever. The pain is at a severity of 6/10. The pain is moderate. Associated symptoms include headaches. Pertinent negatives include no coughing, diarrhea, ear discharge, sore throat or vomiting. Associated symptoms comments: Ringing in ears off and on x 6 months. She has tried NSAIDs for the symptoms. The treatment provided mild relief.    Past Medical History:  Diagnosis Date  . Anemia   . Anxiety   . Bell's palsy   . Migraines   . OCD (obsessive compulsive disorder)   . Pneumonia 03/2017  . Rectal prolapse      Social History   Socioeconomic History  . Marital status: Married    Spouse name: Not on file  . Number of children: 3  . Years of education: Not on file  . Highest education level: Not on file  Occupational History  . 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 Financial resource strain: Not on file  . Food insecurity:    Worry: Not on file    Inability: Not on file  . Transportation needs:    Medical: Not on file    Non-medical: Not on file  Tobacco Use  . Smoking status: Never Smoker  . Smokeless tobacco: Never Used  Substance and Sexual Activity  . Alcohol use: No  . Drug use: No  . Sexual activity: Yes    Partners: Male    Birth control/protection: IUD  Lifestyle  . Physical activity:    Days per week: Not on file    Minutes per session: Not on file  . Stress: Not on file  Relationships  . Social connections:    Talks on phone: Not on file    Gets together: Not on file    Attends religious service: Not on file    Active member of club  or organization: Not on file    Attends meetings of clubs or organizations: Not on file    Relationship status: Not on file  . Intimate partner violence:    Fear of current or ex partner: Not on file    Emotionally abused: Not on file    Physically abused: Not on file    Forced sexual activity: Not on file  Other Topics Concern  . Not on file  Social History Narrative   3 kids, ranging 3-8 y/o   Married almost 10 years   Doesn't work   Fun: [employer] kiddos, reading, hiking    Past Surgical History:  Procedure Laterality Date  . NO PAST SURGERIES      Family History  Problem Relation Age of Onset  . Thyroid disease Mother   . Kidney cancer Mother   . Asthma Father   . Irritable bowel syndrome Father   . Heart disease Sister   . Diabetes Mellitus II Maternal Grandmother   . Mental illness Maternal Grandfather   . Glaucoma Paternal Grandfather   . Celiac disease Sister   . Celiac disease Son        tests high  for it, not 100% sure he has it  . Breast cancer Maternal Aunt   . Colon cancer Neg Hx   . Throat cancer Neg Hx   . Rectal cancer Neg Hx     Allergies  Allergen Reactions  . Other Anaphylaxis    Coke and red [place], Throat closes up    Current Medications:   Current Outpatient Medications:  .  Ferrous Fumarate 63 (20 Fe) MG TABS, Take 1 tablet by mouth daily. , Disp: , Rfl:    Review of Systems:   Review of Systems  HENT: Positive for ear pain. Negative for ear discharge and sore throat.   Respiratory: Negative for cough.   Gastrointestinal: Negative for diarrhea and vomiting.  Neurological: Positive for headaches.    Vitals:   Vitals:   07/24/18 1341  BP: 108/72  Pulse: 93  Temp: 98.1 F (36.7 C)  TempSrc: Oral  SpO2: 98%  Weight: 150 lb 8 oz (68.3 kg)  Height: 5' 2.5" (1.588 m)     Body mass index is 27.09 kg/m.  Physical Exam:   Physical Exam  Constitutional: She appears well-developed. She is cooperative.  Non-toxic appearance. She  does not have a sickly appearance. She does not appear ill. No distress.  HENT:  Head: Normocephalic and atraumatic.  Right Ear: Tympanic membrane, external ear and ear canal normal. Tympanic membrane is not erythematous, not retracted and not bulging.  Left Ear: Tympanic membrane, external ear and ear canal normal. Tympanic membrane is not erythematous, not retracted and not bulging.  Nose: Nose normal. Right sinus exhibits no maxillary sinus tenderness and no frontal sinus tenderness. Left sinus exhibits no maxillary sinus tenderness and no frontal sinus tenderness.  Mouth/Throat: Uvula is midline. No posterior oropharyngeal edema or posterior oropharyngeal erythema.  Trace clear fluid in L ear  Eyes: Conjunctivae and lids are normal.  Neck: Trachea normal.  Cardiovascular: Normal rate, regular rhythm, S1 normal, S2 normal and normal heart sounds.  Pulmonary/Chest: Effort normal and breath sounds normal. She has no decreased breath sounds. She has no wheezes. She has no rhonchi. She has no rales.  Lymphadenopathy:    She has no cervical adenopathy.  Neurological: She is alert.  Skin: Skin is warm, dry and intact.  Psychiatric: She has a normal mood and affect. Her speech is normal and behavior is normal.  Nursing note and vitals reviewed.   Assessment and Plan:    [name] was seen today for otalgia.  Diagnoses and all orders for this visit:  Otalgia of both ears   No red flags on exam. Suspect possible eustachian tube dysfunction.  Recommended Flonase. Discussed taking medications as prescribed. Reviewed return precautions including worsening fever, SOB, worsening cough or other concerns. Push fluids and rest. I recommend that patient follow-up if symptoms worsen or persist despite treatment x 7-10 days, sooner if needed.   . Reviewed expectations re: course of current medical issues. . Discussed self-management of symptoms. . Outlined signs and symptoms indicating need for more  acute intervention. . Patient verbalized understanding and all questions were answered. . See orders for this visit as documented in the electronic medical record. . Patient received an After-Visit Summary.  CMA or LPN served as scribe during this visit. History, Physical, and Plan performed by medical provider. The above documentation has been reviewed and is accurate and complete.  Jarold MottoSamantha Gaby Harney, PA-C

## 2018-07-24 NOTE — Patient Instructions (Signed)
It was great to see you!  Start Flonase daily in AM and PM to see if that helps your symptoms.  Please let us know if know improvement or any worsening.   Eustachian Tube Dysfunction The eustachian tube connects the middle ear to the back of the nose. It regulates air pressure in the middle ear by allowing air to move between the ear and nose. It also helps to drain fluid from the middle ear space. When the eustachian tube does not function properly, air pressure, fluid, or both can build up in the middle ear. Eustachian tube dysfunction can affect one or both ears. What are the causes? This condition happens when the eustachian tube becomes blocked or cannot open normally. This may result from:  Ear infections.  Colds and other upper respiratory infections.  Allergies.  Irritation, such as from cigarette smoke or acid from the stomach coming up into the esophagus (gastroesophageal reflux).  Sudden changes in air pressure, such as from descending in an airplane.  Abnormal growths in the nose or throat, such as nasal polyps, tumors, or enlarged tissue at the back of the throat (adenoids).  What increases the risk? This condition may be more likely to develop in people who smoke and people who are overweight. Eustachian tube dysfunction may also be more likely to develop in children, especially children who have:  Certain birth defects of the mouth, such as cleft palate.  Large tonsils and adenoids.  What are the signs or symptoms? Symptoms of this condition may include:  A feeling of fullness in the ear.  Ear pain.  Clicking or popping noises in the ear.  Ringing in the ear.  Hearing loss.  Loss of balance.  Symptoms may get worse when the air pressure around you changes, such as when you travel to an area of high elevation or fly on an airplane. How is this diagnosed? This condition may be diagnosed based on:  Your symptoms.  A physical exam of your ear, nose, and  throat.  Tests, such as those that measure: ? The movement of your eardrum (tympanogram). ? Your hearing (audiometry).  How is this treated? Treatment depends on the cause and severity of your condition. If your symptoms are mild, you may be able to relieve your symptoms by moving air into ("popping") your ears. If you have symptoms of fluid in your ears, treatment may include:  Decongestants.  Antihistamines.  Nasal sprays or ear drops that contain medicines that reduce swelling (steroids).  In some cases, you may need to have a procedure to drain the fluid in your eardrum (myringotomy). In this procedure, a small tube is placed in the eardrum to:  Drain the fluid.  Restore the air in the middle ear space.  Follow these instructions at home:  Take over-the-counter and prescription medicines only as told by your health care provider.  Use techniques to help pop your ears as recommended by your health care provider. These may include: ? Chewing gum. ? Yawning. ? Frequent, forceful swallowing. ? Closing your mouth, holding your nose closed, and gently blowing as if you are trying to blow air out of your nose.  Do not do any of the following until your health care provider approves: ? Travel to high altitudes. ? Fly in airplanes. ? Work in a Estate agentpressurized cabin or room. ? Scuba dive.  Keep your ears dry. Dry your ears completely after showering or bathing.  Do not smoke.  Keep all follow-up visits as  told by your health care provider. This is important. Contact a health care provider if:  Your symptoms do not go away after treatment.  Your symptoms come back after treatment.  You are unable to pop your ears.  You have: ? A fever. ? Pain in your ear. ? Pain in your head or neck. ? Fluid draining from your ear.  Your hearing suddenly changes.  You become very dizzy.  You lose your balance. This information is not intended to replace advice given to you by your  health care provider. Make sure you discuss any questions you have with your health care provider. Document Released: 09/25/2015 Document Revised: 02/04/2016 Document Reviewed: 09/17/2014 Elsevier Interactive Patient Education  [employer].

## 2018-08-13 ENCOUNTER — Encounter: Payer: Self-pay | Admitting: Psychiatry

## 2018-08-13 ENCOUNTER — Ambulatory Visit (INDEPENDENT_AMBULATORY_CARE_PROVIDER_SITE_OTHER): Payer: BLUE CROSS/BLUE SHIELD | Admitting: Psychiatry

## 2018-08-13 DIAGNOSIS — F422 Mixed obsessional thoughts and acts: Secondary | ICD-10-CM | POA: Diagnosis not present

## 2018-08-13 NOTE — Progress Notes (Signed)
      [facility] Counselor/Therapist Progress Note  Patient ID: Yolanda Kelly, MRN: [id number],    Date: 08/13/2018  Time Spent: 46 minutes  Treatment Type: Individual Therapy  Reported Symptoms: Anxious Mood, Ruminations and Sleep disturbance  Mental Status Exam:  Appearance:   Well Groomed     Behavior:  Appropriate  Motor:  Normal  Speech/Language:   Normal Rate  Affect:  Appropriate  Mood:  anxious  Thought process:  normal  Thought content:    WNL  Sensory/Perceptual disturbances:    WNL  Orientation:  oriented to person, place and time/date  Attention:  Good  Concentration:  Good  Memory:  WNL  Fund of knowledge:   Good  Insight:    Good  Judgment:   Good  Impulse Control:  Good   Risk Assessment: Danger to Self:  No Self-injurious Behavior: No Danger to Others: No Duty to Warn:no Physical Aggression / Violence:No  Access to Firearms a concern: No  Gang Involvement:No   Subjective: Patient was present for session.  She reports overall she is doing pretty well.  She shared she had her IUD removed and her anxiety has seemed to decrease.  Patient went on to explain she continues to struggle with her OCD but recognizes that part of it is situational and typical for the holidays.  Patient also explained her husband has had 2 grandparents passed away in the past couple months which has impacted his mood negatively.  Patient is also having struggles with HER-2 oldest children.  Discussed what is currently going on and how to handle the situations.  Patient was asked about her friend who had committed suicide.  She became very tearful and admitted even though she is feeling better in some ways she is continuing to avoid some situations at church that trigger her grief.  Patient acknowledged it is due to unresolved grief but did not want to address it in treatment at this time.  Patient was encouraged to keep talking herself through situations and to remember she will get through  the pain with time.  Patient was encouraged to continue utilizing her coping skills to help her stay grounded through the holidays and the difficult time with her husband's family.  Interventions: Solution-Oriented/Positive Psychology  Diagnosis:   ICD-10-CM   1. Mixed obsessional thoughts and acts F42.2     Plan: 1.  Patient to continue to engage in individual counseling 2-4 times a month or as needed. 2.  Patient to identify and apply CBT, coping skills learned in session to decrease OCD and anxiety symptoms. 3.  Patient to contact this office, go to the local ED or call 911 if a crisis or emergency develops between visits.  Lina Sayre, [place]

## 2018-08-15 ENCOUNTER — Ambulatory Visit (INDEPENDENT_AMBULATORY_CARE_PROVIDER_SITE_OTHER): Payer: BLUE CROSS/BLUE SHIELD | Admitting: Physician Assistant

## 2018-08-15 ENCOUNTER — Telehealth: Payer: Self-pay

## 2018-08-15 ENCOUNTER — Encounter: Payer: Self-pay | Admitting: Physician Assistant

## 2018-08-15 ENCOUNTER — Encounter

## 2018-08-15 DIAGNOSIS — F411 Generalized anxiety disorder: Secondary | ICD-10-CM

## 2018-08-15 DIAGNOSIS — G4700 Insomnia, unspecified: Secondary | ICD-10-CM

## 2018-08-15 MED ORDER — ZALEPLON 10 MG PO CAPS: ORAL_CAPSULE | ORAL | 0 refills | Status: DC

## 2018-08-15 NOTE — Telephone Encounter (Signed)
Prior Authorization approved for Zaleplon 10mg 

## 2018-08-15 NOTE — Progress Notes (Signed)
[facility] Med Check  Patient ID: Yolanda Kelly,  MRN: [id number]  PCP: [name], PA  Date of Evaluation: 08/15/2018 Time spent:15 minutes  Chief Complaint:  Chief Complaint    Follow-up      HISTORY/CURRENT STATUS: HPI here for routine med check.  She missed her last appointment because her kids had a stomach bug and she did not want to spread that here.  Thankfully she never got it.  She states she is feeling better as far as the anxiety goes.  She had her IUD removed which she feels might have played a big part in the anxiety.  She is not sure of course but states it could be a factor.  When she does get anxious now it is triggered by something.  "I can handle that."  She is not taking the Xanax anymore.  The biggest problem she is having right now asleep.  She sometimes has trouble falling asleep because her thoughts will not slow down.  And she also wakes up often during the night, sometimes not being able to go back to sleep for a couple of hours.  She woke up at least 5 times last night.  She is probably getting around 6 hours asleep on those nights, which occurs the majority of the time.  She tried trazodone but is no longer taking it.  States it made her feel dizzy and lightheaded and took till about 4:00 the next day to wear off so she did not like it.  She is tried the Xanax but it did not really seem to help with sleep. She practices good sleep hygiene.  She does not use electronic devices within about 2 hours of the time she wants to go to bed.  She exercises really and always in the mornings.  She does not drink any caffeine at all.  Individual Medical History/ Review of Systems: Changes? :No    Past medications for mental health diagnoses include: Trazodone, Xanax, over-the-counter sleep meds, melatonin Allergies: Other  Current Medications:  Current Outpatient Medications:  .  Ferrous Fumarate 63 (20 Fe) MG TABS, Take 1 tablet by mouth daily. , Disp: , Rfl:   .  zaleplon (SONATA) 10 MG capsule, 1/2-1 po qhs prn sleep.  May repeat 1/2-1 for mid-nocturnal awakening as long as there are 3 hours left to sleep., Disp: 30 capsule, Rfl: 0 Medication Side Effects: dizziness/lightheadedness  Family Medical/ Social History: Changes? No  MENTAL HEALTH EXAM:  There were no vitals taken for this visit.There is no height or weight on file to calculate BMI.  General Appearance: Casual and Well Groomed  Eye Contact:  Good  Speech:  Clear and Coherent  Volume:  Normal  Mood:  Euthymic  Affect:  Appropriate  Thought Process:  Goal Directed  Orientation:  Full (Time, Place, and Person)  Thought Content: Logical   Suicidal Thoughts:  No  Homicidal Thoughts:  No  Memory:  WNL  Judgement:  Good  Insight:  Good  Psychomotor Activity:  Normal  Concentration:  Concentration: Good  Recall:  Good  Fund of Knowledge: Good  Language: Good  Assets:  Desire for Improvement  ADL's:  Intact  Cognition: WNL  Prognosis:  Good    DIAGNOSES:    ICD-10-CM   1. Insomnia, unspecified type G47.00   2. Generalized anxiety disorder F41.1     Receiving Psychotherapy: Yes With Stevphen Meuse, [facility]   RECOMMENDATIONS: We discussed sleep hygiene and are continuing to practice those things. Discussed other  options including Remeron, Ambien, Lunesta, Sonata.  Due to her particular sleep issues, recommend Sonata.  We discussed benefits, side effects, risk factors and she accepts. Take Sonata as above.  The Kathaleen BurySonata is a capsule and she knows to open it up, sprinkle approximately half of the contents in applesauce or pudding, if she wishes to try the lower dose to begin with. Continue psychotherapy with Stevphen MeuseHolly Ingram, LPC. Return in 6 to 8 weeks.   Melony Overlyeresa Alferd Obryant, PA-C

## 2018-09-26 ENCOUNTER — Ambulatory Visit (INDEPENDENT_AMBULATORY_CARE_PROVIDER_SITE_OTHER): Payer: BLUE CROSS/BLUE SHIELD | Admitting: Physician Assistant

## 2018-09-26 ENCOUNTER — Encounter: Payer: Self-pay | Admitting: Physician Assistant

## 2018-09-26 DIAGNOSIS — F411 Generalized anxiety disorder: Secondary | ICD-10-CM

## 2018-09-26 DIAGNOSIS — G4700 Insomnia, unspecified: Secondary | ICD-10-CM

## 2018-09-26 DIAGNOSIS — F422 Mixed obsessional thoughts and acts: Secondary | ICD-10-CM

## 2018-09-26 NOTE — Progress Notes (Signed)
[facility] Check  Patient ID: Yolanda Kelly,  MRN: [id number]  PCP: [name], PA  Date of Evaluation: 09/26/2018 Time spent:15 minutes  Chief Complaint:  Chief Complaint    Follow-up      HISTORY/CURRENT STATUS: HPI For routine med check.  Accompanied by her son.  Sleeping a little bit better with the Sonata.  She is not really sure how much it helped her fall asleep or stay asleep but she did feel more rested the next day.  It doesn't cause excessive drowsiness the next day.  She hasn't taken many.    Anxiety is mostly controlled.  She still very hesitant to take the Xanax.  She wants to take as little medicine as possible.   Still has a lot of obsessive thoughts and tendencies.  Does not want to take medication for it on a routine basis.  Patient denies loss of interest in usual activities and is able to enjoy things.  Denies decreased energy or motivation.  Appetite has not changed.  No extreme sadness, tearfulness, or feelings of hopelessness.  Denies any changes in concentration, making decisions or remembering things.  Denies suicidal or homicidal thoughts.  Individual Medical History/ Review of Systems: Changes? :No   Allergies: Other  Current Medications:  Current Outpatient Medications:  .  ALPRAZolam (XANAX) 0.25 MG tablet, Take 0.25 mg by mouth 2 (two) times daily as needed for anxiety., Disp: , Rfl:  .  Ferrous Fumarate 63 (20 Fe) MG TABS, Take 1 tablet by mouth daily. , Disp: , Rfl:  .  zaleplon (SONATA) 10 MG capsule, 1/2-1 po qhs prn sleep.  May repeat 1/2-1 for mid-nocturnal awakening as long as there are 3 hours left to sleep., Disp: 30 capsule, Rfl: 0 Medication Side Effects: none  Family Medical/ Social History: Changes? No  MENTAL HEALTH EXAM:  There were no vitals taken for this visit.There is no height or weight on file to calculate BMI.  General Appearance: Casual and Well Groomed  Eye Contact:  Good  Speech:  Clear and Coherent   Volume:  Normal  Mood:  Euthymic  Affect:  Appropriate  Thought Process:  Goal Directed  Orientation:  Full (Time, Place, and Person)  Thought Content: Logical   Suicidal Thoughts:  No  Homicidal Thoughts:  No  Memory:  WNL  Judgement:  Good  Insight:  Good  Psychomotor Activity:  Normal  Concentration:  Concentration: Good  Recall:  Good  Fund of Knowledge: Good  Language: Good  Assets:  Desire for Improvement  ADL's:  Intact  Cognition: WNL  Prognosis:  Good    DIAGNOSES:    ICD-10-CM   1. Insomnia, unspecified type G47.00   2. Generalized anxiety disorder F41.1   3. Mixed obsessional thoughts and acts F42.2      Receiving Psychotherapy: Yes Stevphen Meuse, [facility]   RECOMMENDATIONS: Discussed the differences between Xanax and Sonata.  Xanax is more for anxiety and Kathaleen Bury is for sleep only.  She asked if 1 is more active than the other.  I said not really they can both be addictive.  She can take the Xanax for anxiety or even to help her mind slowdown in the evening which hopefully will help her sleep better.  She will try that.  She will do 1 or the other of the Xanax 0.25 mg half to 1 twice daily as needed.  Or use Sonata 10 mg nightly as needed with 1 in the middle of the night as needed if  she has 3 hours left to sleep. Continue psychotherapy with Stevphen Meuse, LPC. Return in 2 months or sooner as needed.   [name], PA-C

## 2018-10-15 ENCOUNTER — Ambulatory Visit: Payer: BLUE CROSS/BLUE SHIELD | Admitting: Psychiatry

## 2018-11-19 ENCOUNTER — Other Ambulatory Visit: Payer: Self-pay | Admitting: Physician Assistant

## 2018-11-19 ENCOUNTER — Telehealth: Payer: Self-pay | Admitting: Physician Assistant

## 2018-11-19 MED ORDER — ALPRAZOLAM 0.25 MG PO TABS: 0.2500 mg | ORAL_TABLET | Freq: Two times a day (BID) | ORAL | 0 refills | Status: DC | PRN

## 2018-11-19 NOTE — Telephone Encounter (Signed)
I sent it in 

## 2018-11-19 NOTE — Telephone Encounter (Signed)
Please triage

## 2018-11-19 NOTE — Telephone Encounter (Signed)
Just asking for a refill on Alprazolam 0.25mg  bid prn anxiety. Doesn't have ov till 03/17 and checking to see if she could get a refill.   Uses [facility]

## 2018-11-19 NOTE — Telephone Encounter (Signed)
Patient left vm 03/06 @5 :18 pm stating she has questions regarding medication and medication refills, patient did not say for which medications.

## 2018-11-27 ENCOUNTER — Ambulatory Visit (INDEPENDENT_AMBULATORY_CARE_PROVIDER_SITE_OTHER): Payer: BLUE CROSS/BLUE SHIELD | Admitting: Physician Assistant

## 2018-11-27 ENCOUNTER — Other Ambulatory Visit: Payer: Self-pay

## 2018-11-27 ENCOUNTER — Encounter: Payer: Self-pay | Admitting: Physician Assistant

## 2018-11-27 DIAGNOSIS — F411 Generalized anxiety disorder: Secondary | ICD-10-CM

## 2018-11-27 DIAGNOSIS — F422 Mixed obsessional thoughts and acts: Secondary | ICD-10-CM | POA: Diagnosis not present

## 2018-11-27 MED ORDER — BUSPIRONE HCL 15 MG PO TABS: ORAL_TABLET | ORAL | 1 refills | Status: DC

## 2018-11-27 NOTE — Progress Notes (Signed)
[facility] Check  Patient ID: Yolanda Kelly,  MRN: [id number]  PCP: [name], PA  Date of Evaluation: 11/27/2018 Time spent:15 minutes  Chief Complaint:  Chief Complaint    Follow-up      HISTORY/CURRENT STATUS: HPI Here for 2 month med check.  Sleep is much better.  Not needing the sleep meds.  Is having a lot more anxiety because of the coronavirus pandemic.  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  She has needed to use the Xanax a little bit more often in the past few weeks.   Patient denies loss of interest in usual activities and is able to enjoy things.  Denies decreased energy or motivation.  Appetite has not changed.  No extreme sadness, tearfulness, or feelings of hopelessness.  Denies any changes in concentration, making decisions or remembering things.  Denies suicidal or homicidal thoughts.  Individual Medical History/ Review of Systems: Changes? :No    Past medications for mental health diagnoses include: Xanax, Sonata  Allergies: Other  Current Medications:  Current Outpatient Medications:  .  ALPRAZolam (XANAX) 0.25 MG tablet, Take 1 tablet (0.25 mg total) by mouth 2 (two) times daily as needed for anxiety., Disp: 30 tablet, Rfl: 0 .  Ferrous Fumarate 63 (20 Fe) MG TABS, Take 1 tablet by mouth daily. , Disp: , Rfl:  .  zaleplon (SONATA) 10 MG capsule, 1/2-1 po qhs prn sleep.  May repeat 1/2-1 for mid-nocturnal awakening as long as there are 3 hours left to sleep., Disp: 30 capsule, Rfl: 0 .  busPIRone (BUSPAR) 15 MG tablet, 1/3 po bid for 5 days, then 2/3 po bid for 5 days, then 1 po bid., Disp: 60 tablet, Rfl: 1 Medication Side Effects: none  Family Medical/ Social History: Changes? No  MENTAL HEALTH EXAM:  There were no vitals taken for this visit.There is no height or weight on file to calculate BMI.  General Appearance: Casual and Well Groomed  Eye Contact:  Good  Speech:  Clear and Coherent   Volume:  Normal  Mood:  Euthymic  Affect:  Appropriate  Thought Process:  Goal Directed  Orientation:  Full (Time, Place, and Person)  Thought Content: Logical   Suicidal Thoughts:  No  Homicidal Thoughts:  No  Memory:  WNL  Judgement:  Good  Insight:  Good  Psychomotor Activity:  Normal  Concentration:  Concentration: Good  Recall:  Good  Fund of Knowledge: Good  Language: Good  Assets:  Desire for Improvement  ADL's:  Intact  Cognition: WNL  Prognosis:  Good    DIAGNOSES:    ICD-10-CM   1. Mixed obsessional thoughts and acts F42.2   2. Generalized anxiety disorder F41.1     Receiving Psychotherapy: Yes    RECOMMENDATIONS: We discussed different options for the OCD.  I highly recommend she go on an SSRI but she is extremely nervous about doing that.  She does not want to "change her brain chemistry."  We also discussed BuSpar as a prevention for the anxiety.  She is willing to try that. Start BuSpar 15 mg one third twice daily for 5 days, then two thirds p.o. twice daily for 5 days then 1 p.o. twice daily.  We discussed benefits, risks, side effects and she accepts. Continue Xanax 0.25 mg p.o. every 12 hours as needed.  PDMP was reviewed. She no longer needs the [place]. Continue psychotherapy. Return in 4 weeks or sooner as needed.  [name], PA-C    This record has been created using [employer].  Chart creation errors have been sought, but may not always have been located and corrected. Such creation errors do not reflect on the standard of medical care.

## 2018-12-11 ENCOUNTER — Ambulatory Visit: Payer: BLUE CROSS/BLUE SHIELD | Admitting: Psychiatry

## 2018-12-11 ENCOUNTER — Encounter

## 2018-12-19 ENCOUNTER — Other Ambulatory Visit: Payer: Self-pay | Admitting: Physician Assistant

## 2018-12-19 DIAGNOSIS — R52 Pain, unspecified: Secondary | ICD-10-CM | POA: Diagnosis not present

## 2018-12-19 DIAGNOSIS — G5603 Carpal tunnel syndrome, bilateral upper limbs: Secondary | ICD-10-CM | POA: Diagnosis not present

## 2018-12-19 NOTE — Telephone Encounter (Signed)
Asking for 90 day but it was just recently prescribed.

## 2018-12-26 ENCOUNTER — Encounter: Payer: Self-pay | Admitting: Physician Assistant

## 2018-12-26 ENCOUNTER — Ambulatory Visit (INDEPENDENT_AMBULATORY_CARE_PROVIDER_SITE_OTHER): Payer: BLUE CROSS/BLUE SHIELD | Admitting: Physician Assistant

## 2018-12-26 ENCOUNTER — Other Ambulatory Visit: Payer: Self-pay

## 2018-12-26 DIAGNOSIS — F422 Mixed obsessional thoughts and acts: Secondary | ICD-10-CM | POA: Diagnosis not present

## 2018-12-26 DIAGNOSIS — G4700 Insomnia, unspecified: Secondary | ICD-10-CM | POA: Diagnosis not present

## 2018-12-26 DIAGNOSIS — F411 Generalized anxiety disorder: Secondary | ICD-10-CM | POA: Diagnosis not present

## 2018-12-26 NOTE — Progress Notes (Signed)
[facility] Med Check  Patient ID: Yolanda HindHaley Rufener,  MRN: [id number]  PCP: Jarold MottoWorley, Samantha, PA  Date of Evaluation: 12/26/2018 Time spent:15 minutes  Chief Complaint:  Chief Complaint    Follow-up     Virtual Visit via Telephone Note  I connected with Yolanda Kelly on 12/26/18 at  9:30 AM EDT by telephone and verified that I am speaking with the correct person using two identifiers.   I discussed the limitations, risks, security and privacy concerns of performing an evaluation and management service by telephone and the availability of in person appointments. I also discussed with the patient that there may be a patient responsible charge related to this service. The patient expressed understanding and agreed to proceed.    HISTORY/CURRENT STATUS: HPI For routine med check.  There have been several changes since we last saw each other.  Coronavirus pandemic and shelter in place orders have affected her a little bit.  States that she is actually okay with things and seeing this as a new normal for right now.  She has also been on steroids, finished yesterday, for carpal tunnel syndrome.  She did not take any Xanax while on those because she was not sure if it was okay.  She did have a little bit more anxiety but it was more generalized and no increased panic attacks.  "I think I am learning how to cope with anxiety better.  Even before the steroids, I was not taking the Xanax as often."  OCD symptoms are about the same.  BuSpar caused headaches and nausea so she went off of it.  Also she reports that her son felt like she was not "all there" and she does not like for anything to change her personality so that was another reason she went off of it.  She sleeps well most of the time and does not need the Sonata very often.  It is been a couple of weeks since she has taken 1 of those pills.  Denies anhedonia, decreased energy or motivation, no easy crying, not isolating except because of  the coronavirus pandemic.  She still does not want to take any medication for the OCD symptoms on a daily basis.  Reminds me that she is fairly "anti-medicine."  Denies muscle or joint pain, stiffness, or dystonia.  Denies dizziness, syncope, seizures, numbness, tingling, tremor, tics, unsteady gait, slurred speech, confusion.   Individual Medical History/ Review of Systems: Changes? :Yes CTS and had steroids, finished yesterday.  Past medications for mental health diagnoses include: Xanax, Sonata, buspar caused h/a and nausea  Allergies: Other  Current Medications:  Current Outpatient Medications:  .  ALPRAZolam (XANAX) 0.25 MG tablet, Take 1 tablet (0.25 mg total) by mouth 2 (two) times daily as needed for anxiety., Disp: 30 tablet, Rfl: 0 .  Ferrous Fumarate 63 (20 Fe) MG TABS, Take 1 tablet by mouth daily. , Disp: , Rfl:  .  zaleplon (SONATA) 10 MG capsule, 1/2-1 po qhs prn sleep.  May repeat 1/2-1 for mid-nocturnal awakening as long as there are 3 hours left to sleep., Disp: 30 capsule, Rfl: 0   Medication Side Effects: headache from Buspar  Family Medical/ Social History: Changes? Yes w/ coronavirus quarantine.  MENTAL HEALTH EXAM:  There were no vitals taken for this visit.There is no height or weight on file to calculate BMI.  General Appearance: Phone visit unable to assess  Eye Contact:  Phone visit unable to assess  Speech:  Clear and Coherent  Volume:  Normal  Mood:  Euthymic  Affect:  Unable to assess  Thought Process:  Goal Directed  Orientation:  Full (Time, Place, and Person)  Thought Content: Logical   Suicidal Thoughts:  No  Homicidal Thoughts:  No  Memory:  WNL  Judgement:  Good  Insight:  Good  Psychomotor Activity:  Able to assess  Concentration:  Concentration: Good  Recall:  Good  Fund of Knowledge: Good  Language: Good  Assets:  Desire for Improvement  ADL's:  Intact  Cognition: WNL  Prognosis:  Good    DIAGNOSES:    ICD-10-CM   1. Mixed  obsessional thoughts and acts F42.2   2. Generalized anxiety disorder F41.1   3. Insomnia, unspecified type G47.00     Receiving Psychotherapy: No    RECOMMENDATIONS: Since she is doing well on the current regimen and not taking the Xanax very often, we will continue that as needed.  If she starts needing it more often, I will insist that she take an SSRI to help prevent the anxiety and OCD symptoms. Continue Xanax 0.25 mg twice daily as needed. Continue Sonata 10 mg nightly as needed and may repeat one for mid nocturnal awakening as needed as long as she has 3 hours left to sleep. Return in 2 months.   [name], PA-C   This record has been created using [employer].  Chart creation errors have been sought, but may not always have been located and corrected. Such creation errors do not reflect on the standard of medical care.

## 2019-01-07 ENCOUNTER — Ambulatory Visit (INDEPENDENT_AMBULATORY_CARE_PROVIDER_SITE_OTHER): Payer: BLUE CROSS/BLUE SHIELD | Admitting: Psychiatry

## 2019-01-07 ENCOUNTER — Other Ambulatory Visit: Payer: Self-pay

## 2019-01-07 DIAGNOSIS — F411 Generalized anxiety disorder: Secondary | ICD-10-CM

## 2019-01-07 DIAGNOSIS — F422 Mixed obsessional thoughts and acts: Secondary | ICD-10-CM

## 2019-01-07 NOTE — Progress Notes (Signed)
[facility] Counselor/Therapist Progress Note  Patient ID: Yolanda Kelly, MRN: [id number],    Date: 01/07/2019  Time Spent: 50 minutes  Virtual Visit via Telephone Note Connected with patient by a video enabled telemedicine/telehealth application or telephone, with their informed consent, and verified patient privacy and that I am speaking with the correct person using two identifiers. I discussed the limitations, risks, security and privacy concerns of performing psychotherapy and management service by telephone and the availability of in person appointments. I also discussed with the patient that there may be a patient responsible charge related to this service. The patient expressed understanding and agreed to proceed. I discussed the treatment planning with the patient. The patient was provided an opportunity to ask questions and all were answered. The patient agreed with the plan and demonstrated an understanding of the instructions. The patient was advised to call  our office if  symptoms worsen or feel they are in a crisis state and need immediate contact.  Therapist Location: home Patient Location: home  Start time: 8:00 AM Stop time: 8:50 AM  Treatment Type: Individual Therapy  Reported Symptoms: obsessive thinking,panic attacks, anxiety, sleep issues  Mental Status Exam:  Appearance:   NA     Behavior:  Sharing  Motor:  NA  Speech/Language:   Normal Rate  Affect:  NA  Mood:  anxious  Thought process:  normal  Thought content:    Obsessions  Sensory/Perceptual disturbances:    WNL  Orientation:  oriented to person, place, time/date and situation  Attention:  Fair  Concentration:  Fair  Memory:  WNL  Fund of knowledge:   Good  Insight:    Good  Judgment:   Good  Impulse Control:  Good   Risk Assessment: Danger to Self:  No Self-injurious Behavior: No Danger to Others: No Duty to Warn:no Physical Aggression / Violence:No  Access to Firearms a concern: No   Gang Involvement:No   Subjective: Met with patient via phone.  She reported that overall she is functioning in the current situation.  She shared that having her family close is helpful to her and so long as her all at home things are okay.  Patient explained the issue comes if she has to go get anything and at that point her OCD can become over the top.  Patient reported she is and that is helping her to go out when she absolutely has to.  She is very concerned about what will happen when things go back to normal for everyone else.  She explained at that point she knows that her anxiety will be over the top.  Patient was encouraged to recognize that she can make steps towards feeling her life is normal again rather than feeling she has to do it all in 1 overwhelming step.  Discussed the fact that her children do need the socialization when it is appropriate so it may be that going to parks where they can walk and ride bikes rather than playing on equipment may be a start.  After she is talked herself through the anxiety with that situation she can increase different situations where she will be around other people.  Patient was encouraged to recognize that she has grounding techniques and CBT skills that will help her in the transition back to interacting with others.  The importance of just focusing on right now and reminding herself of the positives currently was discussed with patient.  Patient agreed to continue focusing on what  she can control fixing change at this time and start making plans for small steps to reintegrate into seeing other people when it is appropriate.  Interventions: Cognitive Behavioral Therapy and Solution-Oriented/Positive Psychology  Diagnosis:   ICD-10-CM   1. Mixed obsessional thoughts and acts F42.2   2. Generalized anxiety disorder F41.1     Plan: 1.  Patient to continue to engage in individual counseling 2-4 times a month or as needed. 2.  Patient to identify and  apply CBT, coping skills learned in session to decrease OCD and anxiety symptoms. 3.  Patient to contact this office, go to the local ED or call 911 if a crisis or emergency develops between visits.  Lina Sayre, [facility]   This record has been created using [employer].  Chart creation errors have been sought, but may not always have been located and corrected. Such creation errors do not reflect on the standard of medical care.

## 2019-01-09 ENCOUNTER — Encounter: Payer: Self-pay | Admitting: Psychiatry

## 2019-01-23 DIAGNOSIS — G5603 Carpal tunnel syndrome, bilateral upper limbs: Secondary | ICD-10-CM | POA: Diagnosis not present

## 2019-01-25 ENCOUNTER — Other Ambulatory Visit: Payer: Self-pay

## 2019-01-25 ENCOUNTER — Encounter: Payer: Self-pay | Admitting: Physician Assistant

## 2019-01-25 ENCOUNTER — Ambulatory Visit (INDEPENDENT_AMBULATORY_CARE_PROVIDER_SITE_OTHER): Payer: BLUE CROSS/BLUE SHIELD | Admitting: Physician Assistant

## 2019-01-25 DIAGNOSIS — F422 Mixed obsessional thoughts and acts: Secondary | ICD-10-CM | POA: Diagnosis not present

## 2019-01-25 DIAGNOSIS — F411 Generalized anxiety disorder: Secondary | ICD-10-CM

## 2019-01-25 MED ORDER — ALPRAZOLAM 0.25 MG PO TABS: 0.2500 mg | ORAL_TABLET | Freq: Two times a day (BID) | ORAL | 1 refills | Status: DC | PRN

## 2019-01-25 NOTE — Progress Notes (Signed)
[facility]  Patient ID: Yolanda Kelly,  MRN: [id number]  PCP: [name], PA  Date of Evaluation: 01/25/2019 Time spent:15 minutes  Chief Complaint:  Chief Complaint    Follow-up     Virtual Visit via Telephone Note  I connected with patient by a video enabled telemedicine application or telephone, with their informed consent, and verified patient privacy and that I am speaking with the correct person using two identifiers.  I am private, in my home and the patient is home.  I discussed the limitations, risks, security and privacy concerns of performing an evaluation and management service by telephone and the availability of in person appointments. I also discussed with the patient that there may be a patient responsible charge related to this service. The patient expressed understanding and agreed to proceed.   I discussed the assessment and treatment plan with the patient. The patient was provided an opportunity to ask questions and all were answered. The patient agreed with the plan and demonstrated an understanding of the instructions.   The patient was advised to call back or seek an in-person evaluation if the symptoms worsen or if the condition fails to improve as anticipated.  I provided 15 minutes of non-face-to-face time during this encounter.  HISTORY/CURRENT STATUS: HPI for 1 month follow-up.  Patient states she is doing pretty well under the circumstances.  She does have anxiety in certain circumstances, worse with the coronavirus pandemic.  Especially like going out in public to the grocery store for example.  He takes all protective precautions but is still anxious.  She will often take a Xanax before she goes because she knows she will have a panic attack if not.  If she does not take the Xanax prior to going out, she will need it after she gets home because she is so wound up.  She still has some of the 11/19/2018 left of 30, so she is not taking that  many.  She does have handwashing issues and is trying to keep her family away from germs more so during this time than others.  It is not debilitating however.  She is sleeping well and not needing the Sonata.  Patient denies loss of interest in usual activities and is able to enjoy things.  Denies decreased energy or motivation.  Appetite has not changed.  No extreme sadness, tearfulness, or feelings of hopelessness.  Denies any changes in concentration, making decisions or remembering things.  Denies suicidal or homicidal thoughts.  Denies dizziness, syncope, seizures, numbness, tingling, tremor, tics, unsteady gait, slurred speech, confusion.  Denies muscle or joint pain, stiffness, or dystonia.  Individual Medical History/ Review of Systems: Changes? :No    Past medications for mental health diagnoses include: Xanax, Sonata, buspar caused h/a and nausea    Allergies: Other  Current Medications:  Current Outpatient Medications:  .  ALPRAZolam (XANAX) 0.25 MG tablet, Take 1 tablet (0.25 mg total) by mouth 2 (two) times daily as needed for anxiety., Disp: 30 tablet, Rfl: 1 .  Ferrous Fumarate 63 (20 Fe) MG TABS, Take 1 tablet by mouth daily. , Disp: , Rfl:  .  zaleplon (SONATA) 10 MG capsule, 1/2-1 po qhs prn sleep.  May repeat 1/2-1 for mid-nocturnal awakening as long as there are 3 hours left to sleep., Disp: 30 capsule, Rfl: 0 Medication Side Effects: none  Family Medical/ Social History: Changes? Yes coronavirus pandemic  MENTAL HEALTH EXAM:  There were no vitals taken for this visit.There is no  height or weight on file to calculate BMI.  General Appearance: unable to assess  Eye Contact:  unable to assess  Speech:  Clear and Coherent  Volume:  Normal  Mood:  Euthymic  Affect:  unable to assess  Thought Process:  Goal Directed  Orientation:  Full (Time, Place, and Person)  Thought Content: Logical   Suicidal Thoughts:  No  Homicidal Thoughts:  No  Memory:  WNL  Judgement:   Good  Insight:  Good  Psychomotor Activity:  unable to assess  Concentration:  Concentration: Good  Recall:  Good  Fund of Knowledge: Good  Language: Good  Assets:  Desire for Improvement  ADL's:  Intact  Cognition: WNL  Prognosis:  Good    DIAGNOSES:    ICD-10-CM   1. Mixed obsessional thoughts and acts F42.2   2. Generalized anxiety disorder F41.1     Receiving Psychotherapy: Yes    RECOMMENDATIONS:  It is very difficult to judge whether she needs something on a daily basis to help with the OCD.  She is not taking the Xanax that often and it does help when needed.  She really prefers not to take the medication on a daily basis which I understand.  We agreed at this point not to add on an SSRI to help prevent and treat the OCD but will reevaluate if she is needing the Xanax more often. Continue Xanax 0.25 mg 1 twice daily as needed.  PDMP was reviewed. Continue psychotherapy. Return in 3 months.   Yolanda Overlyeresa Devarius Nelles, PA-C   This record has been created using [employer] software.  Chart creation errors have been sought, but may not always have been located and corrected. Such creation errors do not reflect on the standard of medical care.

## 2019-02-05 ENCOUNTER — Other Ambulatory Visit: Payer: Self-pay

## 2019-02-05 ENCOUNTER — Ambulatory Visit (INDEPENDENT_AMBULATORY_CARE_PROVIDER_SITE_OTHER): Payer: BLUE CROSS/BLUE SHIELD | Admitting: Psychiatry

## 2019-02-05 DIAGNOSIS — F422 Mixed obsessional thoughts and acts: Secondary | ICD-10-CM | POA: Diagnosis not present

## 2019-02-05 NOTE — Progress Notes (Signed)
[facility] Counselor/Therapist Progress Note  Patient ID: Yolanda Kelly, MRN: [id number],    Date: 02/05/2019  Time Spent: 51 minutes start time 9:00 AM 9:51 AM Virtual Visit via Telephone Note Connected with patient by a video enabled telemedicine/telehealth application or telephone, with their informed consent, and verified patient privacy and that I am speaking with the correct person using two identifiers. I discussed the limitations, risks, security and privacy concerns of performing psychotherapy and management service by telephone and the availability of in person appointments. I also discussed with the patient that there may be a patient responsible charge related to this service. The patient expressed understanding and agreed to proceed. I discussed the treatment planning with the patient. The patient was provided an opportunity to ask questions and all were answered. The patient agreed with the plan and demonstrated an understanding of the instructions. The patient was advised to call  our office if  symptoms worsen or feel they are in a crisis state and need immediate contact.   Therapist Location: home Patient Location: home    Treatment Type: Individual Therapy  Reported Symptoms: anxiety, obsessive thinking, anxiety attacks  Mental Status Exam:  Appearance:   NA     Behavior:  Sharing  Motor:  Normal  Speech/Language:   Normal Rate  Affect:  NA  Mood:  normal  Thought process:  normal  Thought content:    WNL  Sensory/Perceptual disturbances:    WNL  Orientation:  oriented to person, place, time/date and situation  Attention:  Good  Concentration:  Good  Memory:  WNL  Fund of knowledge:   Good  Insight:    Good  Judgment:   Good  Impulse Control:  Good   Risk Assessment: Danger to Self:  No Self-injurious Behavior: No Danger to Others: No Duty to Warn:no Physical Aggression / Violence:No  Access to Firearms a concern: No  Gang Involvement:No    Subjective: Met with patient via phone.  Patient reported that she was trying hard to work through her obsessive thinking.  She shared she has been able to go the grocery stores with out having panic attacks every single time which is progress.  She shared she is trying to use her coping skills and focus on her self talk to get her through the different situations as well as diaphragmatic breathing and grounding techniques.  She shared she is dealing with her oldest son who is going to have to go to endocrinologist at [facility] due to growth issues.  She shared she has been beating herself up because she is not sure if she is done enough for him.  Patient shared he has been going to pediatric endocrinologist and she has been following their recommendations but she is noticed some red flags that has made her concerned that things are not going in a positive direction and when she contacted [facility] they agreed and got him in very quickly.  Patient explained now she is angry with herself because what if something should have been done a long time ago.  Patient was encouraged to take some deep breaths and to remind herself of the truth that she had been taking him to a doctor and she trusted the doctor to know what they were doing and there was no way for her to have known anything else.  Patient was encouraged to focus on what she has done for her son already rather than on what could potentially be going on  and just to take things 1 step at a time.  Patient was reminded that there is no perfect parent and there is no way for her to know everything that  will ever be needed for her child what is important is that she has a great relationship with them and they are able to communicate and talk through all the different problems they face.  The importance of focusing on continuing to build those relationships were discussed with patient.  Patient was able to recognize that she does have good  coping skills and she is trying to teach them to her kids and that will help them get through what ever they have to.  Patient was encouraged to focus on the positive impact of their relationship rather than any of the negative possibilities that may not even exist.  Interventions: Solution-Oriented/Positive Psychology, CBT  Diagnosis:   ICD-10-CM   1. Mixed obsessional thoughts and acts F42.2     Plan: 1.  Patient to continue to engage in individual counseling 1-2  times a month or as needed. 2.  Patient to identify and apply CBT, coping skills learned in session to decrease OCD and anxiety symptoms. 3.  Patient to contact this office, go to the local ED or call 911 if a crisis or emergency develops between visits.  Lina Sayre, [facility]   This record has been created using [employer].  Chart creation errors have been sought, but may not always have been located and corrected. Such creation errors do not reflect on the standard of medical care.

## 2019-02-07 ENCOUNTER — Encounter: Payer: Self-pay | Admitting: Psychiatry

## 2019-03-08 ENCOUNTER — Telehealth: Payer: Self-pay | Admitting: Psychiatry

## 2019-03-08 NOTE — Telephone Encounter (Signed)
Pt states her husband would like to be able to use some of her session time to help him understand more about Mariadelcarmen's issues. Not sure if that is doable. He does see a different therapist here. Can you include him on a session with her at some point?

## 2019-03-08 NOTE — Telephone Encounter (Signed)
Returned patient phone call.  There was no answer so message was left stating that she was welcome to bring him to next session.

## 2019-03-11 ENCOUNTER — Encounter: Payer: Self-pay | Admitting: Psychiatry

## 2019-03-11 ENCOUNTER — Ambulatory Visit (INDEPENDENT_AMBULATORY_CARE_PROVIDER_SITE_OTHER): Payer: BC Managed Care – PPO | Admitting: Psychiatry

## 2019-03-11 ENCOUNTER — Other Ambulatory Visit: Payer: Self-pay

## 2019-03-11 DIAGNOSIS — F422 Mixed obsessional thoughts and acts: Secondary | ICD-10-CM

## 2019-03-11 DIAGNOSIS — F411 Generalized anxiety disorder: Secondary | ICD-10-CM

## 2019-03-11 NOTE — Progress Notes (Signed)
[facility] Counselor/Therapist Progress Note  Patient ID: Yolanda Kelly, MRN: [id number],    Date: 03/11/2019  Time Spent: 51 minutes start time 8:01 AM end time 8:52 AM Virtual Visit via Telephone Note Connected with patient by a video enabled telemedicine/telehealth application or telephone, with their informed consent, and verified patient privacy and that I am speaking with the correct person using two identifiers. I discussed the limitations, risks, security and privacy concerns of performing psychotherapy and management service by telephone and the availability of in person appointments. I also discussed with the patient that there may be a patient responsible charge related to this service. The patient expressed understanding and agreed to proceed. I discussed the treatment planning with the patient. The patient was provided an opportunity to ask questions and all were answered. The patient agreed with the plan and demonstrated an understanding of the instructions. The patient was advised to call  our office if  symptoms worsen or feel they are in a crisis state and need immediate contact.   Therapist Location: office Patient Location: home   Treatment Type: Individual Therapy  Reported Symptoms: Anxiety, panic attacks, sleep issues  Mental Status Exam:  Appearance:   Casual     Behavior:  Sharing  Motor:  Normal  Speech/Language:   Normal Rate  Affect:  Appropriate  Mood:  anxious  Thought process:  normal  Thought content:    Obsessions  Sensory/Perceptual disturbances:    WNL  Orientation:  oriented to person, place, time/date and situation  Attention:  Good  Concentration:  Good  Memory:  WNL  Fund of knowledge:   Good  Insight:    Good  Judgment:   Good  Impulse Control:  Good   Risk Assessment: Danger to Self:  No Self-injurious Behavior: No Danger to Others: No Duty to Warn:no Physical Aggression / Violence:No  Access to Firearms a concern: No  Gang  Involvement:No   Subjective: Met with patient via virtual session through WebEx.  Patient's husband joined first part of the meeting.  Patient explained her obsessive tendencies have been over the top recently.  She shared the germs with the virus have ramped her up and it was to the point that her husband was having difficulty managing with her emotions.  He explained that he does not know when he needs to intervene, be supportive, or just allow her to handle things.  He also explained that being working from home is a large part of why it is so difficult for him to ignore things currently.  They both shared that when they are allowed to function they do very well but currently things have gotten to the point where it is very difficult and they wanted to know some ways to handle the situation.  Discussed the importance of being able to communicate how each of them is feeling or doing so that the other person can know how to intervene or what needs to happen or does not need to happen.  Discussed the possibility of them having signals or with words that send a message to each other without having to involve the children or cause anxiety for them.  Patient and husband discussed options and decided to use a stoplight method that if 1 of him was having a very bad time they was a red if it was a caution I am working through this and got it it would be yellow if they were good to go it would be green.  Both parties felt that that would be very helpful and felt good about the option.  Spent time with just patient to address how she can handle going to the grocery store and getting out of the house without having extreme panic attacks.  Patient reported that her coping skills are still helping but with the new outbreak it is triggered even more anxiety.  Encourage patient to find times when the stores would not be overwhelming.  Discussed the fact that mornings seem to have less people so maybe she needs to go in the  morning when she can feel a more calm and in control environment.  Patient agreed that just being around other people she is able to since there is stress which makes her more stressed so that may be a good option.  Patient shared she has been trying to push herself because she does not want to feel like she is giving into her OCD fears.  Patient was encouraged to recognize this is not a normal time and that she is still facing her fears that she can remind herself that she will get to the other side and it is temporary but she also has to have some Shirlee Limerick and set up some successful ways for her to manage them.  Patient agreed to try the plans developed in session and to continue working on her CBT filters to talk her self through going to grocery stores and being in public.  Interventions: Cognitive Behavioral Therapy and Solution-Oriented/Positive Psychology  Diagnosis:   ICD-10-CM   1. Mixed obsessional thoughts and acts  F42.2   2. Generalized anxiety disorder  F41.1     Plan: 1.  Patient to continue to engage in individual counseling 1-2 times a month or as needed. 2.  Patient to identify and apply CBT, coping skills learned in session to decrease OCD and anxiety symptoms. 3.  Patient to contact this office, go to the local ED or call 911 if a crisis or emergency develops between visits.  Lina Sayre, [name]  This record has been created using [employer].  Chart creation errors have been sought, but may not always have been located and corrected. Such creation errors do not reflect on the standard of medical care.

## 2019-04-10 DIAGNOSIS — R42 Dizziness and giddiness: Secondary | ICD-10-CM | POA: Diagnosis not present

## 2019-04-10 DIAGNOSIS — H9313 Tinnitus, bilateral: Secondary | ICD-10-CM | POA: Diagnosis not present

## 2019-04-10 DIAGNOSIS — H93233 Hyperacusis, bilateral: Secondary | ICD-10-CM | POA: Diagnosis not present

## 2019-04-12 ENCOUNTER — Other Ambulatory Visit (INDEPENDENT_AMBULATORY_CARE_PROVIDER_SITE_OTHER): Payer: Self-pay

## 2019-04-12 ENCOUNTER — Encounter: Payer: Self-pay | Admitting: Physician Assistant

## 2019-04-12 ENCOUNTER — Ambulatory Visit (INDEPENDENT_AMBULATORY_CARE_PROVIDER_SITE_OTHER): Payer: BLUE CROSS/BLUE SHIELD | Admitting: Physician Assistant

## 2019-04-12 DIAGNOSIS — R5383 Other fatigue: Secondary | ICD-10-CM | POA: Diagnosis not present

## 2019-04-12 DIAGNOSIS — D509 Iron deficiency anemia, unspecified: Secondary | ICD-10-CM

## 2019-04-12 DIAGNOSIS — R42 Dizziness and giddiness: Secondary | ICD-10-CM | POA: Diagnosis not present

## 2019-04-12 NOTE — Progress Notes (Signed)
Virtual Visit via Video   I connected with Yolanda Kelly on 04/12/19 at  1:00 PM EDT by a video enabled telemedicine application and verified that I am speaking with the correct person using two identifiers. Location patient: Home Location provider: [facility], Office Persons participating in the virtual visit: Josephina GipHaley Gens, Sherell Christoffel PA-C.  I discussed the limitations of evaluation and management by telemedicine and the availability of in person appointments. The patient expressed understanding and agreed to proceed.    Subjective:   HPI:   Dizzy Pt c/o dizziness x 2-3 weeks off and on, more frequently in the morning and when she stands up. Feels off balance. Episode of almost fainting 2 weeks ago and lasted about 10 minutes. Pt has been seeing ENT for ear issues, has ringing in ears x 1 yr and other issues from Shingles in ears. She last saw her ENT on 04/10/19 and they scheduled for her follow-up next week for Dix-Hallpike maneuver. If this is negative, she was instructed to follow-up with us. Does have hx anemia and labs have not been checked in 1 year. States that she takes oral iron regularly.  Denies: chest pain, vision changes, SOB, slurred speech, changes in balance, headache  Patient's last menstrual period was 03/11/2019. Husband has had vasectomy.   ROS: See pertinent positives and negatives per HPI.  Patient Active Problem List   Diagnosis Date Noted  . Generalized anxiety disorder 06/25/2018  . Mixed obsessional thoughts and acts 06/25/2018  . Anxiety 05/23/2018  . Insomnia 05/23/2018  . OCD (obsessive compulsive disorder) 03/29/2017  . Migraine without aura and without status migrainosus, not intractable 02/16/2017  . 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 Rectal mucosa prolapse 07/24/2014  . Iron deficiency anemia 04/18/2013    Social History   Tobacco Use  . Smoking status: Never Smoker  . Smokeless tobacco: Never Used  Substance Use Topics   . Alcohol use: No    Current Outpatient Medications:  .  ALPRAZolam (XANAX) 0.25 MG tablet, Take 1 tablet (0.25 mg total) by mouth 2 (two) times daily as needed for anxiety., Disp: 30 tablet, Rfl: 1 .  Ferrous Fumarate 63 (20 Fe) MG TABS, Take 1 tablet by mouth daily. , Disp: , Rfl:   Allergies  Allergen Reactions  . Other Anaphylaxis    Coke and red mt. Dew, Throat closes up    Objective:   VITALS: Per patient if applicable, see vitals. GENERAL: Alert, appears well and in no acute distress. HEENT: Atraumatic, conjunctiva clear, no obvious abnormalities on inspection of external nose and ears. NECK: Normal movements of the head and neck. CARDIOPULMONARY: No increased WOB. Speaking in clear sentences. I:E ratio WNL.  MS: Moves all visible extremities without noticeable abnormality. PSYCH: Pleasant and cooperative, well-groomed. Speech normal rate and rhythm. Affect is appropriate. Insight and judgement are appropriate. Attention is focused, linear, and appropriate.  NEURO: CN grossly intact. Oriented as arrived to appointment on time with no prompting. Moves both UE equally.  SKIN: No obvious lesions, wounds, erythema, or cyanosis noted on face or hands.  Assessment and Plan:   [name] was seen today for dizziness.  Diagnoses and all orders for this visit:  Iron deficiency anemia, unspecified iron deficiency anemia type Updated labs and make recommendations as appropriate. -     CBC with Differential/Platelet; Future -     Iron, TIBC and Ferritin Panel; Future -     Vitamin B12; Future  Episodic lightheadedness ENT has scheduled follow-up  with her next week for this, however will rule out other etiology with labs at this time. She understands that she will need to follow-up with [place] if she doesn't have a positive test. Worsening precautions discussed. -     CBC with Differential/Platelet; Future -     Comprehensive metabolic panel; Future -     TSH; Future  . Reviewed  expectations re: course of current medical issues. . Discussed self-management of symptoms. . Outlined signs and symptoms indicating need for more acute intervention. . Patient verbalized understanding and all questions were answered. Marland Kitchen Health Maintenance issues including appropriate healthy diet, exercise, and smoking avoidance were discussed with patient. . See orders for this visit as documented in the electronic medical record.  I discussed the assessment and treatment plan with the patient. The patient was provided an opportunity to ask questions and all were answered. The patient agreed with the plan and demonstrated an understanding of the instructions.   The patient was advised to call back or seek an in-person evaluation if the symptoms worsen or if the condition fails to improve as anticipated.   CMA or LPN served as scribe during this visit. History, Physical, and Plan performed by medical provider. The above documentation has been reviewed and is accurate and complete.  [place], [place] 04/12/2019

## 2019-04-13 LAB — CBC
HCT: 38.3 % (ref 35.0–45.0)
Hemoglobin: 12.1 g/dL (ref 11.7–15.5)
MCH: 22.2 pg — ABNORMAL LOW (ref 27.0–33.0)
MCHC: 31.6 g/dL — ABNORMAL LOW (ref 32.0–36.0)
MCV: 70.1 fL — ABNORMAL LOW (ref 80.0–100.0)
MPV: 10.9 fL (ref 7.5–12.5)
Platelets: 374 10*3/uL (ref 140–400)
RBC: 5.46 10*6/uL — ABNORMAL HIGH (ref 3.80–5.10)
RDW: 14.6 % (ref 11.0–15.0)
WBC: 12.3 10*3/uL — ABNORMAL HIGH (ref 3.8–10.8)

## 2019-04-13 LAB — IRON,TIBC AND FERRITIN PANEL
%SAT: 21 % (calc) (ref 16–45)
Ferritin: 211 ng/mL — ABNORMAL HIGH (ref 16–154)
Iron: 64 ug/dL (ref 40–190)
TIBC: 298 mcg/dL (calc) (ref 250–450)

## 2019-04-13 LAB — COMPREHENSIVE METABOLIC PANEL
AG Ratio: 1.5 (calc) (ref 1.0–2.5)
ALT: 14 U/L (ref 6–29)
AST: 16 U/L (ref 10–30)
Albumin: 4.5 g/dL (ref 3.6–5.1)
Alkaline phosphatase (APISO): 73 U/L (ref 31–125)
BUN: 14 mg/dL (ref 7–25)
CO2: 26 mmol/L (ref 20–32)
Calcium: 9.7 mg/dL (ref 8.6–10.2)
Chloride: 103 mmol/L (ref 98–110)
Creat: 0.68 mg/dL (ref 0.50–1.10)
Globulin: 3.1 g/dL (calc) (ref 1.9–3.7)
Glucose, Bld: 90 mg/dL (ref 65–99)
Potassium: 4.5 mmol/L (ref 3.5–5.3)
Sodium: 139 mmol/L (ref 135–146)
Total Bilirubin: 0.3 mg/dL (ref 0.2–1.2)
Total Protein: 7.6 g/dL (ref 6.1–8.1)

## 2019-04-13 LAB — TSH: TSH: 1.01 mIU/L

## 2019-04-13 LAB — VITAMIN B12: Vitamin B-12: 1015 pg/mL (ref 200–1100)

## 2019-04-15 ENCOUNTER — Other Ambulatory Visit: Payer: Self-pay | Admitting: *Deleted

## 2019-04-15 DIAGNOSIS — D72829 Elevated white blood cell count, unspecified: Secondary | ICD-10-CM

## 2019-04-15 DIAGNOSIS — D509 Iron deficiency anemia, unspecified: Secondary | ICD-10-CM

## 2019-04-16 ENCOUNTER — Other Ambulatory Visit: Payer: Self-pay

## 2019-04-16 ENCOUNTER — Ambulatory Visit (INDEPENDENT_AMBULATORY_CARE_PROVIDER_SITE_OTHER): Payer: BC Managed Care – PPO | Admitting: Psychiatry

## 2019-04-16 ENCOUNTER — Encounter: Payer: Self-pay | Admitting: Psychiatry

## 2019-04-16 DIAGNOSIS — F422 Mixed obsessional thoughts and acts: Secondary | ICD-10-CM

## 2019-04-16 DIAGNOSIS — F411 Generalized anxiety disorder: Secondary | ICD-10-CM | POA: Diagnosis not present

## 2019-04-16 NOTE — Progress Notes (Signed)
[facility] Counselor/Therapist Progress Note  Patient ID: Yolanda Kelly, MRN: [id number],    Date: 04/16/2019  Time Spent: 52 minutes start time 8:02 AM end time 8:54 AM Virtual Visit via Telephone Note Connected with patient by a video enabled telemedicine/telehealth application or telephone, with their informed consent, and verified patient privacy and that I am speaking with the correct person using two identifiers. I discussed the limitations, risks, security and privacy concerns of performing psychotherapy and management service by telephone and the availability of in person appointments. I also discussed with the patient that there may be a patient responsible charge related to this service. The patient expressed understanding and agreed to proceed. I discussed the treatment planning with the patient. The patient was provided an opportunity to ask questions and all were answered. The patient agreed with the plan and demonstrated an understanding of the instructions. The patient was advised to call  our office if  symptoms worsen or feel they are in a crisis state and need immediate contact.   Therapist Location: office Patient Location: home    Treatment Type: Individual Therapy  Reported Symptoms: anxiety, panic attack, compulsive behaviors,obsessive thinking, fatigue  Mental Status Exam:  Appearance:   NA     Behavior:  Sharing  Motor:  none  Speech/Language:   Normal Rate  Affect:  NA  Mood:  normal  Thought process:  normal  Thought content:    WNL  Sensory/Perceptual disturbances:    WNL  Orientation:  oriented to person, place, time/date and situation  Attention:  Good  Concentration:  Good  Memory:  WNL  Fund of knowledge:   Good  Insight:    Good  Judgment:   Good  Impulse Control:  Good   Risk Assessment: Danger to Self:  No Self-injurious Behavior: No Danger to Others: No Duty to Warn:no Physical Aggression / Violence:No  Access to Firearms a concern:  No  Gang Involvement:No   Subjective: Met with patient via phone.  Patient reported that overall she seemed to be managing her OCD better and they were working through different issues that were arising.  Shared she has decided to homeschool her children for this year discussed how that probably is a positive decision for her family and her situation.  Patient shared that she and her husband are following through with plans from last session and that has been helpful.  She reported that she has been more aware of when her OCD tendencies are surfacing and been utilizing her coping skills to keep them in check.  Discussed the importance of her CBT skills and that self talk to keep perspective especially when she notices her emotions increasing in a negative direction.  Patient shared her biggest concern currently health issues.  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  Patient explained as to her being run currently on her blood work.  She explained her white cells increased and her iron supplement is no longer working so she has been referred to hematologist to try and figure out what is going on.  Patient shared she has lots of guilt illnesses and fear that they will impact her children negatively.  Patient was encouraged to recognize that she has a very positive relationship with her children and they communicate about what is going on in a positive manner so it is likely  that they will actually develop coping skills through all that they are going through with her mom.  Patient shared that she has recognize they seem to have more empathy care about others more than several children she has noticed.  She discussed feeling that those behaviors are coming from what they have experienced with her and having to help her at different times.  Patient was encouraged to redirect her thoughts when those guilt  feelings start to surface and to focus on the positives that have already come from their situation and to feel encouraged that more positive things can happen.  Patient reported feeling much better at the end of session and agreed to start trying to make lists of positives to focus on when she starts feeling the negative mom guilt surface.  Interventions: Cognitive Behavioral Therapy and Solution-Oriented/Positive Psychology  Diagnosis:   ICD-10-CM   1. Mixed obsessional thoughts and acts  F42.2   2. Generalized anxiety disorder  F41.1     Plan: 1.  Patient to continue to engage in individual counseling 2-4 times a month or as needed. 2.  Patient to identify and apply CBT, coping skills learned in session to decrease OCD and anxiety symptoms. 3.  Patient to contact this office, go to the local ED or call 911 if a crisis or emergency develops between visits.  Lina Sayre, [facility]   This record has been created using [employer].  Chart creation errors have been sought, but may not always have been located and corrected. Such creation errors do not reflect on the standard of medical care.

## 2019-04-17 DIAGNOSIS — R42 Dizziness and giddiness: Secondary | ICD-10-CM | POA: Diagnosis not present

## 2019-04-24 ENCOUNTER — Telehealth: Payer: Self-pay | Admitting: Oncology

## 2019-04-24 NOTE — Telephone Encounter (Signed)
Received a new hem referral from Dr. Morene Rankins for leukocytosis and IDA. Pt has been called and scheduled to see Dr. Alen Blew on 8/27 at 2pm.

## 2019-04-26 ENCOUNTER — Encounter: Payer: Self-pay | Admitting: Physician Assistant

## 2019-04-26 ENCOUNTER — Other Ambulatory Visit: Payer: Self-pay

## 2019-04-26 ENCOUNTER — Ambulatory Visit (INDEPENDENT_AMBULATORY_CARE_PROVIDER_SITE_OTHER): Payer: BC Managed Care – PPO | Admitting: Physician Assistant

## 2019-04-26 DIAGNOSIS — F422 Mixed obsessional thoughts and acts: Secondary | ICD-10-CM

## 2019-04-26 DIAGNOSIS — F411 Generalized anxiety disorder: Secondary | ICD-10-CM | POA: Diagnosis not present

## 2019-04-26 DIAGNOSIS — G4700 Insomnia, unspecified: Secondary | ICD-10-CM | POA: Diagnosis not present

## 2019-04-26 NOTE — Progress Notes (Addendum)
[facility] Med Check  Patient ID: Yolanda Kelly,  MRN: [id number]  PCP: Jarold MottoWorley, Samantha, PA  Date of Evaluation: 04/26/2019 Time spent:15 minutes  Chief Complaint:  Chief Complaint    Follow-up     Virtual Visit via Telephone Note  I connected with patient by a video enabled telemedicine application or telephone, with their informed consent, and verified patient privacy and that I am speaking with the correct person using two identifiers.  I am private, in my home and the patient is home.   I discussed the limitations, risks, security and privacy concerns of performing an evaluation and management service by telephone and the availability of in person appointments. I also discussed with the patient that there may be a patient responsible charge related to this service. The patient expressed understanding and agreed to proceed.   I discussed the assessment and treatment plan with the patient. The patient was provided an opportunity to ask questions and all were answered. The patient agreed with the plan and demonstrated an understanding of the instructions.   The patient was advised to call back or seek an in-person evaluation if the symptoms worsen or if the condition fails to improve as anticipated.  I provided 15 minutes of non-face-to-face time during this encounter.  HISTORY/CURRENT STATUS: HPI For 3 month med check.  Patient states she is doing remarkably well under the circumstances of the pandemic and having to make decisions as to whether her kids will go back to school or they will be homeschooled.  She and her husband have decided on the latter.  Reports that therapy with [facility] is very [facility].  Patient is not obsessing over things like she was.  She deals with things that she can change or do something about and does not obsess about things she cannot make changes with.  No compulsive behaviors that are worrisome.  She is able to enjoy things, denies  decreased energy or motivation.  She is not isolating any more than is necessary with the coronavirus pandemic.  Not crying easily or having big swings in her emotions for no reason.  She does still have anxiety and takes the Xanax on occasion but not often.  She has not refilled the prescription that was originally given in May.  Sleeps well most of the time but sometimes she will take a Sonata if she has had several nights in a row where she did not sleep well.  It is not common for her to take that med however.  She denies suicidal or homicidal thoughts.  Denies dizziness, syncope, seizures, numbness, tingling, tremor, tics, unsteady gait, slurred speech, confusion. Denies muscle or joint pain, stiffness, or dystonia.  Individual Medical History/ Review of Systems: Changes? :No    Past medications for mental health diagnoses include: Xanax, Sonata, buspar caused h/a and nausea    Allergies: Other  Current Medications:  Current Outpatient Medications:  .  ALPRAZolam (XANAX) 0.25 MG tablet, Take 1 tablet (0.25 mg total) by mouth 2 (two) times daily as needed for anxiety., Disp: 30 tablet, Rfl: 1 .  Ferrous Fumarate 63 (20 Fe) MG TABS, Take 1 tablet by mouth daily. , Disp: , Rfl:  .  zaleplon (SONATA) 10 MG capsule, Take 10 mg by mouth at bedtime as needed for sleep. May repeat 1 for MNA if has 3 hours left to sleep., Disp: , Rfl:  Medication Side Effects: none  Family Medical/ Social History: Changes? Yes Homeschooling her kids   MENTAL HEALTH  EXAM:  There were no vitals taken for this visit.There is no height or weight on file to calculate BMI.  General Appearance: Unable to assess  Eye Contact:  Unable to assess  Speech:  Clear and Coherent  Volume:  Normal  Mood:  Euthymic  Affect:  Appropriate  Thought Process:  Goal Directed  Orientation:  Full (Time, Place, and Person)  Thought Content: Logical   Suicidal Thoughts:  No  Homicidal Thoughts:  No  Memory:  WNL  Judgement:  Good   Insight:  Good  Psychomotor Activity:  Unable to assess  Concentration:  Concentration: Good  Recall:  Good  Fund of Knowledge: Good  Language: Good  Assets:  Desire for Improvement  ADL's:  Intact  Cognition: WNL  Prognosis:  Good    DIAGNOSES:    ICD-10-CM   1. Generalized anxiety disorder  F41.1   2. Insomnia, unspecified type  G47.00   3. Mixed obsessional thoughts and acts  F42.2     Receiving Psychotherapy: Yes With Lina Sayre, [facility]   RECOMMENDATIONS:  Continue Xanax 0.25 mg 1 p.o. twice daily as needed.  PDMP was reviewed. Continue Sonata 10 mg, 1 nightly as needed and may repeat 1 for mid nocturnal awakening as long as she has 3 hours left to sleep. Continue psychotherapy with Lina Sayre, LPC. Return in 6 months.  Donnal Moat, PA-C   This record has been created using [employer].  Chart creation errors have been sought, but may not always have been located and corrected. Such creation errors do not reflect on the standard of medical care.

## 2019-04-29 ENCOUNTER — Telehealth: Payer: Self-pay

## 2019-04-29 NOTE — Telephone Encounter (Signed)
Called pt and she got this taken care of at urgent care.

## 2019-04-29 NOTE — Telephone Encounter (Signed)
Noted  

## 2019-04-29 NOTE — Telephone Encounter (Signed)
[facility] [place] TELEPHONE ADVICE RECORD AccessNurse Patient Name: Yolanda Kelly Gender: Female DOB: [birth year] Age: 33 Y 6 M 4 D Return Phone Number: [phone] (Primary) Address: City/State/Zip: [place] [id number] Client [facility] - Human resources [place] Client Site [facility] Physician Yolanda Kelly- PA Contact Type Call Who Is Calling Patient / Member / Family / Caregiver Call Type Triage / Clinical Relationship To Patient Self Return Phone Number [phone] (Primary) Chief Complaint Eye Redness Reason for Call Symptomatic / Request for Health Information Initial Comment Caller states, Wanting Sat hours for eye infection. Her son passed her this eye infection. Red eye and discharge. Translation No Nurse Assessment Nurse: Yolanda Kelly Date/Time Yolanda Kelly(Eastern Time): 04/27/2019 7:21:53 AM Confirm and document reason for call. If symptomatic, describe symptoms. ---Caller states she has developed bilat sclera redness and yellow discharge. Eyes itchy, uncomfortable. Son with recent eye infection. Has the patient had close contact with a person known or suspected to have the novel coronavirus illness OR traveled / lives in area with major community spread (including international travel) in the last 14 days from the onset of symptoms? * If Asymptomatic, screen for exposure and travel within the last 14 days. ---No Does the patient have any new or worsening symptoms? ---Yes Will a triage be completed? ---Yes Related visit to physician within the last 2 weeks? ---No Does the PT have any chronic conditions? (i.e. diabetes, asthma, this includes High risk factors for pregnancy, etc.) ---Yes List chronic conditions. ---anemia Is the patient pregnant or possibly pregnant? (Ask all females between the ages of 3912-55) ---No Is this a behavioral health or substance abuse call?  ---No Guidelines Guideline Title Affirmed Question Affirmed Notes Nurse Date/Time (Eastern Time) Eye - Pus or Discharge [1] Eye with yellow/green discharge or eyelashes stick together AND [2] Yolanda Kelly 04/27/2019 7:23:03 AM PLEASE NOTE: All timestamps contained within this report are represented as [place] Standard Time. CONFIDENTIALTY NOTICE: This fax transmission is intended only for the addressee. It contains information that is legally privileged, confidential or otherwise protected from use or disclosure. If you are not the intended recipient, you are strictly prohibited from reviewing, disclosing, copying using or disseminating any of this information or taking any action in reliance on or regarding this information. If you have received this fax in error, please notify us immediately by telephone so that we can arrange for its return to us. Phone: [phone], Toll-Free: [phone], Fax: [phone] Page: 2 of 2 Call Id: [id number] Guidelines Guideline Title Affirmed Question Affirmed Notes Nurse Date/Time Yolanda Kelly(Eastern Time) NO PCP standing order to call in antibiotic eye drops Disp. Time Yolanda Kelly(Eastern Time) Disposition Final User 04/27/2019 7:29:44 AM Call PCP within 24 Hours Yes Yolanda Kelly Caller Disagree/Comply Comply Caller Understands Yes PreDisposition InappropriateToAsk Care Advice Given Per Guideline CALL PCP WITHIN 24 HOURS: * You need to discuss this with your doctor (or NP/PA) within the next 24 hours. * IF OFFICE WILL BE CLOSED: I'll page the on-call provider now. EXCEPTION: from 9 pm to 9 am. Since this isn't urgent, we'll hold the page until morning. EYELID CLEANSING: * Gently wash eyelids and lashes with warm water and wet cotton balls (or cotton gauze). Remove all the dried and liquid pus. * Do this as often as needed. CALL BACK IF: * You become worse CARE ADVICE given per Eye - Pus or Discharge (Adult) guideline. Comments User: Yolanda Kelly  Date/Time (  Eastern Time): 04/27/2019 7:29:34 AM Pt provided with number per directive to call for Saturday visit. Advised that if does not receive phone call regarding appt by midmorning, may want to consider seeking care at [facility], as directive was not updated with location details for today's date, and appts may not be possible today.

## 2019-04-29 NOTE — Telephone Encounter (Signed)
Hailey, I have no appointments today. Please schedule virtual visit for tomorrow with Sam or offer appt with someone today.

## 2019-05-09 ENCOUNTER — Inpatient Hospital Stay: Payer: BC Managed Care – PPO | Attending: Oncology | Admitting: Oncology

## 2019-05-09 ENCOUNTER — Other Ambulatory Visit: Payer: Self-pay

## 2019-05-09 VITALS — BP 120/85 | HR 82 | Temp 99.8°F | Resp 18 | Ht 62.5 in | Wt 153.5 lb

## 2019-05-09 DIAGNOSIS — D72829 Elevated white blood cell count, unspecified: Secondary | ICD-10-CM | POA: Insufficient documentation

## 2019-05-09 DIAGNOSIS — F419 Anxiety disorder, unspecified: Secondary | ICD-10-CM | POA: Insufficient documentation

## 2019-05-09 DIAGNOSIS — Z803 Family history of malignant neoplasm of breast: Secondary | ICD-10-CM | POA: Insufficient documentation

## 2019-05-09 DIAGNOSIS — R718 Other abnormality of red blood cells: Secondary | ICD-10-CM | POA: Insufficient documentation

## 2019-05-09 DIAGNOSIS — Z79899 Other long term (current) drug therapy: Secondary | ICD-10-CM | POA: Insufficient documentation

## 2019-05-09 NOTE — Progress Notes (Signed)
Reason for the request:    Leukocytosis  HPI: I was asked by [name], PA-C to evaluate [name] for leukocytosis.  She is 33 year old woman with history of anxiety but no other significant comorbid conditions.  She has been diagnosed with iron deficiency and has been on oral iron supplements.  CBC obtained on April 12, 2019 showed a white cell count of 12.3, hemoglobin of 12.1 and a platelet count of 374.  Her MCV was 70 with iron studies at that time showed iron level of 64 and ferritin to 11.  Looking back at the counts dating back to 2015 she had fluctuation in white cell count as high as 15,000 with a low MCV since that time.  He denies any complaints at this time without any excessive fatigue tiredness.  She denies any fevers chills or constitutional symptoms.  She denies any lymphadenopathy.   She does not report any headaches, blurry vision, syncope or seizures. Does not report any fevers, chills or sweats.  Does not report any cough, wheezing or hemoptysis.  Does not report any chest pain, palpitation, orthopnea or leg edema.  Does not report any nausea, vomiting or abdominal pain.  Does not report any constipation or diarrhea.  Does not report any skeletal complaints.    Does not report frequency, urgency or hematuria.  Does not report any skin rashes or lesions. Does not report any heat or cold intolerance.  Does not report any lymphadenopathy or petechiae.  Does not report any anxiety or depression.  Remaining review of systems is negative.    Past Medical History:  Diagnosis Date  . Anemia   . Anxiety   . Bell's palsy   . Migraines   . OCD (obsessive compulsive disorder)   . Pneumonia 03/2017  . Rectal prolapse   :  Past Surgical History:  Procedure Laterality Date  . NO PAST SURGERIES    :   Current Outpatient Medications:  .  ALPRAZolam (XANAX) 0.25 MG tablet, Take 1 tablet (0.25 mg total) by mouth 2 (two) times daily as needed for anxiety., Disp: 30 tablet, Rfl:  1 .  Ferrous Fumarate 63 (20 Fe) MG TABS, Take 1 tablet by mouth daily. , Disp: , Rfl:  .  zaleplon (SONATA) 10 MG capsule, Take 10 mg by mouth at bedtime as needed for sleep. May repeat 1 for MNA if has 3 hours left to sleep., Disp: , Rfl: :  Allergies  Allergen Reactions  . Other Anaphylaxis    Coke and red mt. Dew, Throat closes up  :  Family History  Problem Relation Age of Onset  . Thyroid disease Mother   . Kidney cancer Mother   . Asthma Father   . Irritable bowel syndrome Father   . Heart disease Sister   . Diabetes Mellitus II Maternal Grandmother   . Mental illness Maternal Grandfather   . Glaucoma Paternal Grandfather   . Celiac disease Sister   . Celiac disease Son        tests high for it, not 100% sure he has it  . Breast cancer Maternal Aunt   . Colon cancer Neg Hx   . Throat cancer Neg Hx   . Rectal cancer Neg Hx   :  Social History   Socioeconomic History  . Marital status: Married    Spouse name: Not on file  . Number of children: 3  . Years of education: Not on file  . Highest education level: Not on file  Occupational History  . 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 Financial resource strain: Not on file  . Food insecurity    Worry: Not on file    Inability: Not on file  . Transportation needs    Medical: Not on file    Non-medical: Not on file  Tobacco Use  . Smoking status: Never Smoker  . Smokeless tobacco: Never Used  Substance and Sexual Activity  . Alcohol use: No  . Drug use: No  . Sexual activity: Yes    Partners: Male  Lifestyle  . Physical activity    Days per week: Not on file    Minutes per session: Not on file  . Stress: Not on file  Relationships  . Social Herbalist on phone: Not on file    Gets together: Not on file    Attends religious service: Not on file    Active member of club or organization: Not on file    Attends meetings of clubs or organizations: Not on file    Relationship status: Not on file  .  Intimate partner violence    Fear of current or ex partner: Not on file    Emotionally abused: Not on file    Physically abused: Not on file    Forced sexual activity: Not on file  Other Topics Concern  . Not on file  Social History Narrative   3 kids, ranging 3-8 y/o   Married almost 10 years   Doesn't work   Fun: chase kiddos, reading, hiking  :  Pertinent items are noted in HPI.  Exam: Blood pressure 120/85, pulse 82, temperature 99.8 F (37.7 C), temperature source Oral, resp. rate 18, height 5' 2.5" (1.588 m), weight 153 lb 8 oz (69.6 kg), SpO2 99 %.   ECOG 0 General appearance: alert and cooperative appeared without distress. Head: atraumatic without any abnormalities. Eyes: conjunctivae/corneas clear. PERRL.  Sclera anicteric. Throat: lips, mucosa, and tongue normal; without oral thrush or ulcers. Resp: clear to auscultation bilaterally without rhonchi, wheezes or dullness to percussion. Cardio: regular rate and rhythm, S1, S2 normal, no murmur, click, rub or gallop GI: soft, non-tender; bowel sounds normal; no masses,  no organomegaly Skin: Skin color, texture, turgor normal. No rashes or lesions Lymph nodes: Cervical, supraclavicular, and axillary nodes normal. Neurologic: Grossly normal without any motor, sensory or deep tendon reflexes. Musculoskeletal: No joint deformity or effusion.  CBC    Component Value Date/Time   WBC 12.3 (H) 04/12/2019 1457   RBC 5.46 (H) 04/12/2019 1457   HGB 12.1 04/12/2019 1457   HCT 38.3 04/12/2019 1457   PLT 374 04/12/2019 1457   MCV 70.1 (L) 04/12/2019 1457   MCH 22.2 (L) 04/12/2019 1457   MCHC 31.6 (L) 04/12/2019 1457   RDW 14.6 04/12/2019 1457   LYMPHSABS 2.0 05/23/2018 0944   MONOABS 0.6 05/23/2018 0944   EOSABS 0.1 05/23/2018 0944   BASOSABS 0.1 05/23/2018 0944    Assessment and Plan:   33 year old with:  1.  Leukocytosis with normal differential dating back to at least 2015.  Her white cell count have ranged  between normal range up to 15,000 with differential that has been normal.  Differential diagnosis for this finding was reviewed today.  Reactive leukocytosis is the most likely etiology at this time.  Myeloproliferative disorder is considered less likely given the chronicity of these findings and the fluctuating nature for the last 5 years.  I have recommended no management at this  time and no further testing unless her white cell count increases dramatically in the future.  2.  Microcytosis without anemia.  She might have an element of iron deficiency but hemoglobinopathy is also a possibility.  Her iron studies appear adequate and I do not recommend any additional treatment for her iron deficiency.  I recommended repeat iron studies in the future and replace iron as needed.  I instructed her to stop iron supplements at this time.  3.  Follow-up: I am happy to see her in the future as needed.  If her white cell count increases rapidly or she develops any worsening cytopenias in the future.  40  minutes was spent with the patient face-to-face today.  More than 50% of time was spent on reviewing her laboratory data, disease status update, differential diagnosis and management options.   Thank you for the referral.  A copy of this consult has been forwarded to the requesting physician.

## 2019-05-14 ENCOUNTER — Ambulatory Visit: Payer: BC Managed Care – PPO | Admitting: Psychiatry

## 2019-06-04 ENCOUNTER — Ambulatory Visit (INDEPENDENT_AMBULATORY_CARE_PROVIDER_SITE_OTHER): Payer: BC Managed Care – PPO | Admitting: Psychiatry

## 2019-06-04 ENCOUNTER — Encounter: Payer: Self-pay | Admitting: Psychiatry

## 2019-06-04 ENCOUNTER — Other Ambulatory Visit: Payer: Self-pay

## 2019-06-04 DIAGNOSIS — F422 Mixed obsessional thoughts and acts: Secondary | ICD-10-CM | POA: Diagnosis not present

## 2019-06-04 NOTE — Progress Notes (Signed)
[facility] Counselor/Therapist Progress Note  Patient ID: Yolanda Kelly, MRN: [id number],    Date: 06/04/2019  Time Spent: 52 minutes start time 11:00 a.m. end time 11:52 AM Virtual Visit via Telephone Note Connected with patient by a video enabled telemedicine/telehealth application or telephone, with their informed consent, and verified patient privacy and that I am speaking with the correct person using two identifiers. I discussed the limitations, risks, security and privacy concerns of performing psychotherapy and management service by telephone and the availability of in person appointments. I also discussed with the patient that there may be a patient responsible charge related to this service. The patient expressed understanding and agreed to proceed. I discussed the treatment planning with the patient. The patient was provided an opportunity to ask questions and all were answered. The patient agreed with the plan and demonstrated an understanding of the instructions. The patient was advised to call  our office if  symptoms worsen or feel they are in a crisis state and need immediate contact.   Therapist Location: office Patient Location: home    Treatment Type: Individual Therapy  Reported Symptoms: anxiety, obsessive thinking, grief issues, fatigue  Mental Status Exam:  Appearance:   NA     Behavior:  Sharing  Motor:  NA  Speech/Language:   Normal Rate  Affect:  NA  Mood:  anxious  Thought process:  normal  Thought content:    WNL  Sensory/Perceptual disturbances:    WNL  Orientation:  oriented to person, place, time/date and situation  Attention:  Good  Concentration:  Good  Memory:  WNL  Fund of knowledge:   Good  Insight:    Good  Judgment:   Good  Impulse Control:  Good   Risk Assessment: Danger to Self:  No Self-injurious Behavior: No Danger to Others: No Duty to Warn:no Physical Aggression / Violence:No  Access to Firearms a concern: No  Gang  Involvement:No   Subjective: Met with patient via phone.  She explained that due to her situation with her children being home that is difficult for her to use video.  Patient went on to share that some answers concerning her medical issues have finally come back.  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  That is why she has the fatigue and difficulties with her immune system.  Patient went on to share she has contacted family members and they are going through testing as well to find out if they have the same issue.  She has at least 1 sister that is believed to have had it and is well as nephews, her father is also going to be tested soon.  She and her husband feel that her youngest son may possibly have the disorder as well.  Patient shared that she is feeling lots of guilt over potentially passing this genetic issue to her children.  She went on to explain each of them will have to be tested to see if they are carriers or if they have the genetic issue.  This will impact whether or not they have children in the future.  Patient explained with the COVID-19 on top of what she is learning about her physical limitations she is struggling with knowing what is her OCD and what is a realistic expectation.  All of these issues are increasing her anxiety.  Patient explained that her youngest is to go to kindergarten soon at the school  rather than on virtual sessions.  Discussed the fact that she may decide to keep him home and do virtual sessions rather than sending him to kindergarten and that with her situation that would not be an OCD thought it would be a proactive consideration knowing her diagnosis and his potential diagnosis.  Discussed why he would still be okay even if he did not go to kindergarten.  Patient reported she has been worrying about what to do with him developmentally and that knowing he could be okay if he did not go this year helps her feel  reassured to be able to make a decision based on their health concerns more than anything else currently.  Encouraged patient to start writing out the positive things that have occurred over the past several years with her health issues.  Reminded her that in the past she has shared that she sees more compassion and Shirlee Limerick and her children than with other children and how she felt that was because they had to have [place] with her when she could not function the way that she wanted to.  Also the fact that she communicates things with her children and helps them to see that everybody has differences will be helpful for them in the future.  Patient was encouraged to start trying to find ways to recognize her health issues as just issues not as weaknesses like she has felt especially with her family focusing on your weaknesses.  Patient was also encouraged to work on her affirmations concerning the fact that she cannot give herself Shirlee Limerick and compassion during this very difficult situation even though it is hard for her.  Reminded patient of CBT skills to use and the importance of putting the right information into her brain and trying to filter her situation and away that reminds her there can be positives even with the negative.  Patient reported feeling much better at the end of session and willing to try strategies discussed.  Interventions: Solution-Oriented/Positive Psychology  Diagnosis:   ICD-10-CM   1. Mixed obsessional thoughts and acts  F42.2     Plan: Patient is to utilize CBT skills to try and help her have perspective concerning her genetic disorder.  Patient is also to journal the positives that she is seen in her children and to remind herself of that when she starts feeling that OCD thoughts cycling in a negative direction.  Patient is also to continue working on her diet and exercise and focusing on what she can do to make herself feel as positive as she can.  Long-term goal: Enhance ability to  handle effectively the full variety of life's anxieties Short-term goal: Identify and anxiety coping mechanism that has been successful in the past and increase its use  Lina Sayre, [facility]

## 2019-07-08 DIAGNOSIS — Z20828 Contact with and (suspected) exposure to other viral communicable diseases: Secondary | ICD-10-CM | POA: Diagnosis not present

## 2019-07-11 ENCOUNTER — Telehealth: Payer: Self-pay | Admitting: Physician Assistant

## 2019-07-11 NOTE — Telephone Encounter (Signed)
Spoke to pt told her the only vaccines I have she has had is her Tdap in 2015 and Flu for this year. Asked pt if she contacted her old pediatrician? Pt said yes but they only kept records for 10 yrs. Asked her if she has had Hepatitis B 3 shots? Pt said yes. Told her we can do a blood test to make sure you are immune.Told her she should find out from the school what vaccines she needs and we can go from there. Pt verbalized understanding.

## 2019-07-11 NOTE — Telephone Encounter (Signed)
See note

## 2019-07-11 NOTE — Telephone Encounter (Signed)
Patient is calling to ask advise regarding her immunizations.  She is needing immunization to return to college.  She has no record of her immunizations. Patient is needing advice. Please advise CB- [phone]

## 2019-07-12 ENCOUNTER — Telehealth: Payer: Self-pay | Admitting: Physician Assistant

## 2019-07-12 ENCOUNTER — Other Ambulatory Visit: Payer: Self-pay | Admitting: *Deleted

## 2019-07-12 DIAGNOSIS — Z1159 Encounter for screening for other viral diseases: Secondary | ICD-10-CM

## 2019-07-12 NOTE — Addendum Note (Signed)
Addended by: Marian Sorrow on: 07/12/2019 01:42 PM   Modules accepted: Orders

## 2019-07-12 NOTE — Telephone Encounter (Signed)
Yolanda Kelly, order has been placed for MMR titer. Pt does not need Tetanus, just had Tdap in 11/18/2013 it is good for 10 yrs.

## 2019-07-12 NOTE — Telephone Encounter (Signed)
Spoke to pt told her I don't think you need 3 Tetanus shots, it is only recommended every 10 yrs and the earliest you can have one is two yrs.  I think they are talking about Hepatitis B vaccines which is a 3 shot series and I know that is required for college. Told her we can add Hepatitis titer to her lab work to see if she is immune and then go from there what shots she needs. Pt verbalized understanding. Order for lab put in.

## 2019-07-12 NOTE — Telephone Encounter (Signed)
She said she has to have 3 tetanus shots to count for her college application for [employer]. She does not need Tdap at all just the tetanus. Just 1 tetanus on Nov 4th then another one in 6 months. She also needs the records from her previous Tdap. Please contact patient directly to advise.  MMR titer has been scheduled.

## 2019-07-12 NOTE — Telephone Encounter (Signed)
Patient requesting  MMR titer. Please advise once order is placed so patient can be scheduled on lab schedule. She has a nurse visit on Nov 4th at 9:30am for tetanus shots

## 2019-07-15 ENCOUNTER — Other Ambulatory Visit: Payer: Self-pay | Admitting: Physician Assistant

## 2019-07-15 DIAGNOSIS — Z1159 Encounter for screening for other viral diseases: Secondary | ICD-10-CM

## 2019-07-17 ENCOUNTER — Other Ambulatory Visit (INDEPENDENT_AMBULATORY_CARE_PROVIDER_SITE_OTHER): Payer: BC Managed Care – PPO

## 2019-07-17 ENCOUNTER — Other Ambulatory Visit: Payer: Self-pay

## 2019-07-17 ENCOUNTER — Ambulatory Visit: Payer: BC Managed Care – PPO

## 2019-07-17 DIAGNOSIS — Z808 Family history of malignant neoplasm of other organs or systems: Secondary | ICD-10-CM | POA: Diagnosis not present

## 2019-07-17 DIAGNOSIS — Z803 Family history of malignant neoplasm of breast: Secondary | ICD-10-CM | POA: Diagnosis not present

## 2019-07-17 DIAGNOSIS — Z1159 Encounter for screening for other viral diseases: Secondary | ICD-10-CM

## 2019-07-17 DIAGNOSIS — Z789 Other specified health status: Secondary | ICD-10-CM | POA: Diagnosis not present

## 2019-07-17 DIAGNOSIS — Z01419 Encounter for gynecological examination (general) (routine) without abnormal findings: Secondary | ICD-10-CM | POA: Diagnosis not present

## 2019-07-17 DIAGNOSIS — N920 Excessive and frequent menstruation with regular cycle: Secondary | ICD-10-CM | POA: Diagnosis not present

## 2019-07-17 DIAGNOSIS — Z6828 Body mass index (BMI) 28.0-28.9, adult: Secondary | ICD-10-CM | POA: Diagnosis not present

## 2019-07-17 LAB — CBC AND DIFFERENTIAL
HCT: 37 (ref 36–46)
Hemoglobin: 11.6 — AB (ref 12.0–16.0)
Platelets: 387 (ref 150–399)
WBC: 11.3

## 2019-07-18 LAB — HEPATITIS B CORE ANTIBODY, TOTAL: Hep B Core Total Ab: NONREACTIVE

## 2019-07-18 LAB — MEASLES/MUMPS/RUBELLA IMMUNITY
Mumps IgG: 180 AU/mL
Rubella: 4.12 index
Rubeola IgG: 65 AU/mL

## 2019-07-18 LAB — HEPATITIS B SURFACE ANTIBODY,QUALITATIVE: Hep B S Ab: NONREACTIVE

## 2019-07-18 LAB — HEPATITIS B SURFACE ANTIGEN: Hepatitis B Surface Ag: NONREACTIVE

## 2019-07-19 ENCOUNTER — Encounter: Payer: Self-pay | Admitting: Physician Assistant

## 2019-07-22 ENCOUNTER — Other Ambulatory Visit: Payer: Self-pay

## 2019-07-22 ENCOUNTER — Ambulatory Visit (INDEPENDENT_AMBULATORY_CARE_PROVIDER_SITE_OTHER): Payer: BC Managed Care – PPO | Admitting: Physician Assistant

## 2019-07-22 ENCOUNTER — Encounter: Payer: Self-pay | Admitting: Physician Assistant

## 2019-07-22 ENCOUNTER — Telehealth: Payer: Self-pay

## 2019-07-22 DIAGNOSIS — D72829 Elevated white blood cell count, unspecified: Secondary | ICD-10-CM | POA: Diagnosis not present

## 2019-07-22 DIAGNOSIS — R718 Other abnormality of red blood cells: Secondary | ICD-10-CM | POA: Diagnosis not present

## 2019-07-22 NOTE — Telephone Encounter (Signed)
Copied from [place] [id number]. Topic: General - Other >> Jul 22, 2019 10:33 AM Celene Kras A wrote: Reason for CRM: Pt called stating she spoke with her hematologist regarding her blood work. PT states they advised her to have the blood work done with PCP and to have the results sent to them afterwards. Please advise.

## 2019-07-22 NOTE — Telephone Encounter (Signed)
Please see message and advise 

## 2019-07-22 NOTE — Progress Notes (Signed)
Virtual Visit via Video   I connected with Yolanda Kelly on 07/22/19 at 11:20 AM EST by a video enabled telemedicine application and verified that I am speaking with the correct person using two identifiers. Location patient: Home Location provider: [facility], Office Persons participating in the virtual visit: Yolanda Kelly, Yolanda Coke PA-C, Yolanda Pickler, LPN   I discussed the limitations of evaluation and management by telemedicine and the availability of in person appointments. The patient expressed understanding and agreed to proceed.  I acted as a Education administrator for [employer], PA-C Yolanda Pickler, LPN  Subjective:   HPI:   Discuss lab results Patient was seen by Dr. Alen Blew and on oncology/hematology in August 2020.  At that visit she was told that she likely has reactive leukocytosis and microcytosis without anemia.  She was given the option to check labs to see if she has a thalassemia, but she states that she declined this during her visit with Dr. Alen Blew.  She was told to follow-up as needed with him.  She did recently get labs at her OB/GYN, and they recommended that she get evaluated given the nature of her lab results, see below.  She denies any new or unusual symptoms.  Results for orders placed or performed in visit on 07/19/19  CBC and differential  Result Value Ref Range   Hemoglobin 11.6 (A) 12.0 - 16.0   HCT 37 36 - 46   Platelets 387 150 - 399   WBC 11.3   MCV 72  MCH 22.6  MCHC 31.4  ROS: See pertinent positives and negatives per HPI.  Patient Active Problem List   Diagnosis Date Noted  . Microcytosis 07/22/2019  . Generalized anxiety disorder 06/25/2018  . Mixed obsessional thoughts and acts 06/25/2018  . Insomnia 05/23/2018  . OCD (obsessive compulsive disorder) 03/29/2017  . Migraine without aura and without status migrainosus, not intractable 02/16/2017  . 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 Rectal mucosa prolapse 07/24/2014     Social History   Tobacco Use  . Smoking status: Never Smoker  . Smokeless tobacco: Never Used  Substance Use Topics  . Alcohol use: No    Current Outpatient Medications:  .  ALPRAZolam (XANAX) 0.25 MG tablet, Take 1 tablet (0.25 mg total) by mouth 2 (two) times daily as needed for anxiety., Disp: 30 tablet, Rfl: 1 .  Ferrous Fumarate 63 (20 Fe) MG TABS, Take 1 tablet by mouth daily. , Disp: , Rfl:   Allergies  Allergen Reactions  . Other Anaphylaxis    Kelly and red mt. Dew, Throat closes up    Objective:   VITALS: Per patient if applicable, see vitals. GENERAL: Alert, appears well and in no acute distress. HEENT: Atraumatic, conjunctiva clear, no obvious abnormalities on inspection of external nose and ears. NECK: Normal movements of the head and neck. CARDIOPULMONARY: No increased WOB. Speaking in clear sentences. I:E ratio WNL.  MS: Moves all visible extremities without noticeable abnormality. PSYCH: Pleasant and cooperative, well-groomed. Speech normal rate and rhythm. Affect is appropriate. Insight and judgement are appropriate. Attention is focused, linear, and appropriate.  NEURO: CN grossly intact. Oriented as arrived to appointment on time with no prompting. Moves both UE equally.  SKIN: No obvious lesions, wounds, erythema, or cyanosis noted on face or hands.  Assessment and Plan:   Yolanda Kelly was seen today for discuss lab results.  Diagnoses and all orders for this visit:  Leukocytosis, unspecified type  Microcytosis   Patient was interested in  determining if she has thalassemia or other underlying blood disorder.  I did recommend that she follow-up with Dr. Clelia Croft for this.  I gave her his phone number and recommended that if she has any issues to reach out to me.  Patient verbalized understanding to plan.  . Reviewed expectations re: course of current medical issues. . Discussed self-management of symptoms. . Outlined signs and symptoms indicating need for more  acute intervention. . Patient verbalized understanding and all questions were answered. Marland Kitchen Health Maintenance issues including appropriate healthy diet, exercise, and smoking avoidance were discussed with patient. . See orders for this visit as documented in the electronic medical record.  I discussed the assessment and treatment plan with the patient. The patient was provided an opportunity to ask questions and all were answered. The patient agreed with the plan and demonstrated an understanding of the instructions.   The patient was advised to call back or seek an in-person evaluation if the symptoms worsen or if the condition fails to improve as anticipated.   CMA or LPN served as scribe during this visit. History, Physical, and Plan performed by medical provider. The above documentation has been reviewed and is accurate and complete.   Yolanda Kelly, [name] 07/22/2019

## 2019-07-23 ENCOUNTER — Ambulatory Visit (INDEPENDENT_AMBULATORY_CARE_PROVIDER_SITE_OTHER): Payer: BC Managed Care – PPO

## 2019-07-23 ENCOUNTER — Other Ambulatory Visit: Payer: Self-pay

## 2019-07-23 DIAGNOSIS — Z23 Encounter for immunization: Secondary | ICD-10-CM

## 2019-07-23 NOTE — Telephone Encounter (Signed)
Please let patient that I have personally sent a message to Dr. Alen Blew (her hematologist) to inquire what labs he would like drawn, as I do not order or interpret these labs.  I will be in touch if/when I hear back from him.

## 2019-07-24 NOTE — Telephone Encounter (Signed)
Spoke to pt told her per Aldona Bar that I have personally sent a message to Dr. Alen Blew (her hematologist) to inquire what labs he would like drawn, as I do not order or interpret these labs.  I will be in touch if/when I hear back from him. Pt verbalized understanding.

## 2019-07-26 ENCOUNTER — Other Ambulatory Visit: Payer: Self-pay | Admitting: Physician Assistant

## 2019-07-26 ENCOUNTER — Telehealth: Payer: Self-pay | Admitting: *Deleted

## 2019-07-26 DIAGNOSIS — R718 Other abnormality of red blood cells: Secondary | ICD-10-CM

## 2019-07-26 NOTE — Telephone Encounter (Signed)
Spoke to pt told her Yolanda Kelly did hear back from  Dr. Alen Blew he has recommended that I check a hemoglobin electrophoresis lab test, and she can come in to get this checked at her convenience. Pt verbalized understanding. Asked pt when would she like to come in for lab appt only. Pt said any day if fine in the morning. Told pt I have an 8:45 AM on Monday. Pt said that is good. Appt scheduled. Pt verbalized understanding.

## 2019-07-26 NOTE — Telephone Encounter (Signed)
-----   Message from [place], [place] sent at 07/26/2019  8:42 AM EST ----- Regarding: FW: question regarding mutual patient Please call patient and let her know that I have reached out to Dr. Alen Blew and have heard back from him.  He has recommended that I check a hemoglobin electrophoresis lab test, and she can come in to get this checked at her convenience.  ----- Message ----- From: Wyatt Portela, MD Sent: 07/24/2019   7:17 AM EST To: Inda Coke, PA Subject: RE: question regarding mutual patient          Hemoglobin electrophoresis which should be simple test to give you an idea whether she has thalassemia.  This could explain her microcytosis.  Even if she has this condition, no treatment is needed and she likely has a very mild form of it but does not require any intervention.  Thanks. ----- Message ----- From: Inda Coke, PA Sent: 07/23/2019   7:55 PM EST To: Wyatt Portela, MD Subject: question regarding mutual patient              Dr. Alen Blew,  I hope this message finds you well.  Our mutual patient, Yolanda Kelly, states that when she saw you previously you had mentioned that she may have thalassemia or other condition. She states that at the time of her visit with you she declined further work-up.  Mikya is now interested in pursuing this testing. She contacted your office and was told that I should order all of the labs and forward them to you.  Please let me know what labs you are suggesting. I typically do not diagnose these conditions.  Thank you for your time,  Inda Coke PA-C

## 2019-07-29 ENCOUNTER — Other Ambulatory Visit: Payer: BC Managed Care – PPO

## 2019-07-30 ENCOUNTER — Other Ambulatory Visit: Payer: Self-pay

## 2019-07-30 ENCOUNTER — Ambulatory Visit (INDEPENDENT_AMBULATORY_CARE_PROVIDER_SITE_OTHER): Payer: BC Managed Care – PPO | Admitting: Physician Assistant

## 2019-07-30 ENCOUNTER — Encounter: Payer: Self-pay | Admitting: Physician Assistant

## 2019-07-30 VITALS — BP 118/80 | HR 76 | Temp 98.2°F | Ht 62.5 in | Wt 156.0 lb

## 2019-07-30 DIAGNOSIS — L309 Dermatitis, unspecified: Secondary | ICD-10-CM | POA: Diagnosis not present

## 2019-07-30 MED ORDER — ESOMEPRAZOLE MAGNESIUM 40 MG PO CPDR: 40.0000 mg | DELAYED_RELEASE_CAPSULE | Freq: Every day | ORAL | 0 refills | Status: DC

## 2019-07-30 MED ORDER — PREDNISONE 10 MG PO TABS: ORAL_TABLET | ORAL | 0 refills | Status: DC

## 2019-07-30 NOTE — Patient Instructions (Signed)
It was great to see you!  Please start daily Nexium and continue your over-the-counter Zyrtec.  Tomorrow, please start prednisone.  Let us trial 20 mg daily for 4 days, and then 10 mg daily for 3 days.  After you stop your prednisone, if you still have ongoing or return of rash, resume the prednisone.  Take care,  Inda Coke PA-C

## 2019-07-30 NOTE — Progress Notes (Signed)
Yolanda Kelly is a 33 y.o. female here for a new problem.  I acted as a Neurosurgeon for [name], PA-C Corky Mull, LPN  History of Present Illness:   Chief Complaint  Patient presents with  . Rash    HPI    Rash Pt c/o rash started on Sunday on cheeks and behind ears but is now spreading. Red and bumpy rash, burning, slight itching. Has tried cortisone cream and allergy pill no relief.  She noticed the rash on Sunday after she was wearing a new mask that she had never worn before.  She denies: Fever, chills, blistery rash, discharge from area, lip swelling, tongue swelling, airway swelling.  She denies any new ointments or creams, detergents in her home.  Past Medical History:  Diagnosis Date  . Anxiety   . Bell's palsy   . OCD (obsessive compulsive disorder)      Social History   Socioeconomic History  . Marital status: Married    Spouse name: Not on file  . Number of children: 3  . Years of education: Not on file  . Highest education level: Not on file  Occupational History  . 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 Financial resource strain: Not on file  . Food insecurity    Worry: Not on file    Inability: Not on file  . Transportation needs    Medical: Not on file    Non-medical: Not on file  Tobacco Use  . Smoking status: Never Smoker  . Smokeless tobacco: Never Used  Substance and Sexual Activity  . Alcohol use: No  . Drug use: No  . Sexual activity: Yes    Partners: Male  Lifestyle  . Physical activity    Days per week: Not on file    Minutes per session: Not on file  . Stress: Not on file  Relationships  . Social Musician on phone: Not on file    Gets together: Not on file    Attends religious service: Not on file    Active member of club or organization: Not on file    Attends meetings of clubs or organizations: Not on file    Relationship status: Not on file  . Intimate partner violence    Fear of current or ex partner: Not on  file    Emotionally abused: Not on file    Physically abused: Not on file    Forced sexual activity: Not on file  Other Topics Concern  . Not on file  Social History Narrative   3 kids, ranging 3-8 y/o   Married almost 10 years   Doesn't work   Fun: chase kiddos, reading, hiking    Past Surgical History:  Procedure Laterality Date  . NO PAST SURGERIES      Family History  Problem Relation Age of Onset  . Thyroid disease Mother   . Kidney cancer Mother   . Asthma Father   . Irritable bowel syndrome Father   . Heart disease Sister   . Diabetes Mellitus II Maternal Grandmother   . Mental illness Maternal Grandfather   . Glaucoma Paternal Grandfather   . Celiac disease Sister   . Celiac disease Son        tests high for it, not 100% sure he has it  . Breast cancer Maternal Aunt   . Colon cancer Neg Hx   . Throat cancer Neg Hx   . Rectal cancer Neg Hx  Allergies  Allergen Reactions  . Other Anaphylaxis    Coke and red mt. Dew, Throat closes up    Current Medications:   Current Outpatient Medications:  .  ALPRAZolam (XANAX) 0.25 MG tablet, Take 1 tablet (0.25 mg total) by mouth 2 (two) times daily as needed for anxiety., Disp: 30 tablet, Rfl: 1 .  esomeprazole (NEXIUM) 40 MG capsule, Take 1 capsule (40 mg total) by mouth daily at 12 noon., Disp: 30 capsule, Rfl: 0 .  Ferrous Fumarate 63 (20 Fe) MG TABS, Take 1 tablet by mouth daily. , Disp: , Rfl:  .  predniSONE (DELTASONE) 10 MG tablet, Take 2 tablets daily x1 week, then 1 tablet daily x1 week., Disp: 21 tablet, Rfl: 0   Review of Systems:   ROS Negative unless otherwise specified per HPI.  Vitals:   Vitals:   07/30/19 1454  BP: 118/80  Pulse: 76  Temp: 98.2 F (36.8 C)  TempSrc: Temporal  SpO2: 99%  Weight: 156 lb (70.8 kg)  Height: 5' 2.5" (1.588 m)     Body mass index is 28.08 kg/m.  Physical Exam:   Physical Exam Constitutional:      Appearance: She is well-developed.  HENT:     Head:  Normocephalic and atraumatic.  Eyes:     Conjunctiva/sclera: Conjunctivae normal.  Neck:     Musculoskeletal: Normal range of motion and neck supple.  Pulmonary:     Effort: Pulmonary effort is normal.  Musculoskeletal: Normal range of motion.  Skin:    General: Skin is warm and dry.     Comments: Erythematous sandpaperlike rash to bilateral lower cheeks, jaw, behind ear.  No open areas.  Neurological:     Mental Status: She is alert and oriented to person, place, and time.  Psychiatric:        Behavior: Behavior normal.        Thought Content: Thought content normal.        Judgment: Judgment normal.     Results for orders placed or performed in visit on 07/19/19  CBC and differential  Result Value Ref Range   Hemoglobin 11.6 (A) 12.0 - 16.0   HCT 37 36 - 46   Platelets 387 150 - 399   WBC 11.3     Assessment and Plan:   Yolanda Kelly was seen today for rash.  Diagnoses and all orders for this visit:  Dermatitis Suspect contact dermatitis from the mask that she was wearing, as the area of redness is exactly where her mask is.  We will trial oral prednisone, Nexium, and continued use of Zyrtec.  I recommended that she follow-up if her symptoms persist, or if they worsen in the interim.  Red flags discussed with patient that would warrant ER visit including but not limited to: Swelling of airway, difficulty breathing, swollen lips.  Other orders -     predniSONE (DELTASONE) 10 MG tablet; Take 2 tablets daily x1 week, then 1 tablet daily x1 week. -     esomeprazole (NEXIUM) 40 MG capsule; Take 1 capsule (40 mg total) by mouth daily at 12 noon.  . Reviewed expectations re: course of current medical issues. . Discussed self-management of symptoms. . Outlined signs and symptoms indicating need for more acute intervention. . Patient verbalized understanding and all questions were answered. . See orders for this visit as documented in the electronic medical record. . Patient received  an After-Visit Summary.  CMA or LPN served as scribe during this visit. History, Physical,  and Plan performed by medical provider. The above documentation has been reviewed and is accurate and complete.  Inda Coke, PA-C

## 2019-07-30 NOTE — Addendum Note (Signed)
Addended by: Francis Dowse T on: 07/30/2019 02:06 PM   Modules accepted: Orders

## 2019-07-31 ENCOUNTER — Other Ambulatory Visit: Payer: Self-pay | Admitting: Physician Assistant

## 2019-07-31 ENCOUNTER — Other Ambulatory Visit (INDEPENDENT_AMBULATORY_CARE_PROVIDER_SITE_OTHER): Payer: BC Managed Care – PPO

## 2019-07-31 ENCOUNTER — Encounter: Payer: Self-pay | Admitting: *Deleted

## 2019-07-31 DIAGNOSIS — R718 Other abnormality of red blood cells: Secondary | ICD-10-CM

## 2019-07-31 LAB — HEMOGLOBIN A1C: Hgb A1c MFr Bld: 5.5 % (ref 4.6–6.5)

## 2019-07-31 NOTE — Addendum Note (Signed)
Addended by: Francis Dowse T on: 07/31/2019 08:48 AM   Modules accepted: Orders

## 2019-08-05 LAB — HEMOGLOBINOPATHY EVALUATION
Fetal Hemoglobin Testing: <1 % (ref 0.0–1.9)
HCT: 39.1 % (ref 35.0–45.0)
Hemoglobin A2 - HGBRFX: 1.9 % (ref 1.8–3.5)
Hemoglobin: 11.8 g/dL (ref 11.7–15.5)
Hgb A: 97.1 % (ref >96.0)
MCH: 22.2 pg — ABNORMAL LOW (ref 27.0–33.0)
MCV: 73.6 fL — ABNORMAL LOW (ref 80.0–100.0)
RBC: 5.31 10*6/uL — ABNORMAL HIGH (ref 3.80–5.10)
RDW: 15.3 % — ABNORMAL HIGH (ref 11.0–15.0)

## 2019-08-07 ENCOUNTER — Encounter: Payer: Self-pay | Admitting: Physician Assistant

## 2019-08-21 ENCOUNTER — Encounter: Payer: Self-pay | Admitting: Physician Assistant

## 2019-08-22 ENCOUNTER — Other Ambulatory Visit: Payer: Self-pay

## 2019-08-22 ENCOUNTER — Ambulatory Visit (INDEPENDENT_AMBULATORY_CARE_PROVIDER_SITE_OTHER): Payer: BC Managed Care – PPO

## 2019-08-22 DIAGNOSIS — Z23 Encounter for immunization: Secondary | ICD-10-CM

## 2019-08-23 NOTE — Telephone Encounter (Signed)
Received form on the 10 th. Form completed.  Left detailed message on personal voicemail. Form is completed, ready for pickup will be at the front desk.

## 2019-08-24 ENCOUNTER — Other Ambulatory Visit: Payer: Self-pay | Admitting: Physician Assistant

## 2019-09-13 DIAGNOSIS — D569 Thalassemia, unspecified: Secondary | ICD-10-CM

## 2019-09-13 HISTORY — DX: Thalassemia, unspecified: D56.9

## 2019-09-17 DIAGNOSIS — D225 Melanocytic nevi of trunk: Secondary | ICD-10-CM | POA: Diagnosis not present

## 2019-09-17 DIAGNOSIS — Z809 Family history of malignant neoplasm, unspecified: Secondary | ICD-10-CM | POA: Diagnosis not present

## 2019-09-17 DIAGNOSIS — D2262 Melanocytic nevi of left upper limb, including shoulder: Secondary | ICD-10-CM | POA: Diagnosis not present

## 2019-09-17 DIAGNOSIS — B078 Other viral warts: Secondary | ICD-10-CM | POA: Diagnosis not present

## 2019-09-17 DIAGNOSIS — L718 Other rosacea: Secondary | ICD-10-CM | POA: Diagnosis not present

## 2019-09-24 ENCOUNTER — Ambulatory Visit: Payer: BC Managed Care – PPO

## 2019-09-30 ENCOUNTER — Other Ambulatory Visit: Payer: Self-pay | Admitting: Physician Assistant

## 2019-10-08 ENCOUNTER — Telehealth: Payer: Self-pay | Admitting: Physician Assistant

## 2019-10-08 NOTE — Telephone Encounter (Signed)
Dr. Tressia Danas from the [facility] Dept called to speak with Yolanda Kelly. She had some follow up questions she wanted to ask regarding the medical exemption request of immunizations for this pt. Please return her call at [phone].

## 2019-10-08 NOTE — Telephone Encounter (Signed)
Left message on voicemail to call office.  

## 2019-10-25 ENCOUNTER — Telehealth: Payer: Self-pay | Admitting: *Deleted

## 2019-10-25 ENCOUNTER — Telehealth: Payer: Self-pay

## 2019-10-25 NOTE — Telephone Encounter (Signed)
Left message on voicemail to call office.  

## 2019-10-25 NOTE — Telephone Encounter (Signed)
Dr. Clyde Lundborg is calling regarding medicar

## 2019-10-25 NOTE — Telephone Encounter (Addendum)
Received call from Dr. Phylliss Blakes who is with [place] [employer] calling about pt's vaccines that we sent a letter saying she was to be exempt from getting another Tdap. She said by law pt can not be exempt from Td vaccines, she must have proof of all Tdap as a child or she must get a Td now and one again in 6 months in order to be in school at [employer]. Also must have MMR. Told her pt has had lab work done saying she is immune for MMR. Dr. Phylliss Blakes said that is fine just fax it over to me. Told her I will contact pt and get her in for Td and then repeat in 6 months and I will fax over other vaccines and lab work to show immunity. Dr. Phylliss Blakes said fax # is [phone] and she will email me the guidelines for vaccines and college. Told her okay, thank you.

## 2019-10-25 NOTE — Telephone Encounter (Signed)
See other message

## 2019-10-25 NOTE — Telephone Encounter (Signed)
Pt called back, explained to her by law she must have two more Td vaccines for school. Dr. Phylliss Blakes called me today and explained that you can not be exempt and also needs proof of MMR Immunity. Told her I will fax results over to her along with vaccine records that we have. Pt verbalized understanding. Told pt need one Td now and one again in 6 months. Told pt can schedule next week for nurse visit. Pt said okay how about Thurs. Appt scheduled for 9:00AM Thurs for Td only. Pt verbalized understanding.

## 2019-10-29 ENCOUNTER — Encounter: Payer: Self-pay | Admitting: Physician Assistant

## 2019-10-29 ENCOUNTER — Ambulatory Visit (INDEPENDENT_AMBULATORY_CARE_PROVIDER_SITE_OTHER): Payer: BC Managed Care – PPO | Admitting: Physician Assistant

## 2019-10-29 DIAGNOSIS — F411 Generalized anxiety disorder: Secondary | ICD-10-CM | POA: Diagnosis not present

## 2019-10-29 DIAGNOSIS — F422 Mixed obsessional thoughts and acts: Secondary | ICD-10-CM | POA: Diagnosis not present

## 2019-10-29 DIAGNOSIS — G4700 Insomnia, unspecified: Secondary | ICD-10-CM | POA: Diagnosis not present
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
## 2019-10-31 ENCOUNTER — Ambulatory Visit: Payer: BC Managed Care – PPO

## 2019-11-12 ENCOUNTER — Telehealth: Payer: Self-pay | Admitting: Physician Assistant

## 2019-11-12 NOTE — Telephone Encounter (Signed)
Left detailed message on personal voicemail to call office. Yolanda Kelly can see you virtually at 7:30  AM tomorrow morning. If you want this appt please call back.

## 2019-11-12 NOTE — Telephone Encounter (Signed)
Pt called back, appt scheduled for tomorrow at 7:30 AM.

## 2019-11-12 NOTE — Telephone Encounter (Signed)
She is having problems with her jaw closing and wants to know if she should talk to [place] or call her dentist. Please advise.

## 2019-11-13 ENCOUNTER — Other Ambulatory Visit: Payer: Self-pay

## 2019-11-13 ENCOUNTER — Encounter: Payer: Self-pay | Admitting: Physician Assistant

## 2019-11-13 ENCOUNTER — Ambulatory Visit (INDEPENDENT_AMBULATORY_CARE_PROVIDER_SITE_OTHER): Payer: BC Managed Care – PPO | Admitting: Physician Assistant

## 2019-11-13 VITALS — Ht 62.5 in | Wt 150.0 lb

## 2019-11-13 DIAGNOSIS — R6884 Jaw pain: Secondary | ICD-10-CM

## 2019-11-13 MED ORDER — BACLOFEN 10 MG PO TABS: 10.0000 mg | ORAL_TABLET | Freq: Two times a day (BID) | ORAL | 0 refills | Status: DC | PRN

## 2019-11-13 NOTE — Progress Notes (Signed)
Virtual Visit via Video   I connected with Yolanda Kelly on 11/13/19 at  7:30 AM EST by a video enabled telemedicine application and verified that I am speaking with the correct person using two identifiers. Location patient: Home Location provider: [facility], Office Persons participating in the virtual visit: Yolanda Kelly, Yolanda Kelly, Yolanda Pickler, Yolanda Kelly   I discussed the limitations of evaluation and management by telemedicine and the availability of in person appointments. The patient expressed understanding and agreed to proceed.  I acted as a Education administrator for [employer], [employer], Yolanda Kelly  Subjective:   HPI:   Jaw pain Pt c/o jaw locking up on both sides R>L. Has been going on a long time, worse the past 3 months. She feels like she can't open her mouth all the way. Has been told by her dentist that she may have TMJ during a past visit. She actually has a visit scheduled with her dentist later today. She does feel a clicking/popping sensation when she opens/closes her jaw. Yesterday was the worst day, when she woke up the R side of her jaw was quite tight.   ROS: See pertinent positives and negatives per HPI.  Patient Active Problem List   Diagnosis Date Noted  . Microcytosis 07/22/2019  . Generalized anxiety disorder 06/25/2018  . Mixed obsessional thoughts and acts 06/25/2018  . Insomnia 05/23/2018  . OCD (obsessive compulsive disorder) 03/29/2017  . Migraine without aura and without status migrainosus, not intractable 02/16/2017  . 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 Rectal mucosa prolapse 07/24/2014    Social History   Tobacco Use  . Smoking status: Never Smoker  . Smokeless tobacco: Never Used  Substance Use Topics  . Alcohol use: No    Current Outpatient Medications:  .  ALPRAZolam (XANAX) 0.25 MG tablet, Take 1 tablet (0.25 mg total) by mouth 2 (two) times daily as needed for anxiety. (Patient not taking: Reported on 11/13/2019),  Disp: 30 tablet, Rfl: 1  Allergies  Allergen Reactions  . Other Anaphylaxis    Coke and red mt. Dew, Throat closes up    Objective:   VITALS: Per patient if applicable, see vitals. GENERAL: Alert, appears well and in no acute distress. HEENT: Atraumatic, conjunctiva clear, no obvious abnormalities on inspection of external nose and ears. NECK: Normal movements of the head and neck. CARDIOPULMONARY: No increased WOB. Speaking in clear sentences. I:E ratio WNL.  MS: Moves all visible extremities without noticeable abnormality. PSYCH: Pleasant and cooperative, well-groomed. Speech normal rate and rhythm. Affect is appropriate. Insight and judgement are appropriate. Attention is focused, linear, and appropriate.  NEURO: CN grossly intact. Oriented as arrived to appointment on time with no prompting. Moves both UE equally.  SKIN: No obvious lesions, wounds, erythema, or cyanosis noted on face or hands.  Assessment and Plan:   Yolanda Kelly was seen today for jaw pain.  Diagnoses and all orders for this visit:  Jaw pain   No red flags on discussion, she is not in acute distress. Agree with visit with dentist for further evaluation and management. Will also order baclofen prn for her to use when her symptoms are bothersome. Sleepy precautions advised. Continue anti-inflammatories prn.   . Reviewed expectations re: course of current medical issues. . Discussed self-management of symptoms. . Outlined signs and symptoms indicating need for more acute intervention. . Patient verbalized understanding and all questions were answered. Marland Kitchen Health Maintenance issues including appropriate healthy diet, exercise, and smoking avoidance were discussed  with patient. . See orders for this visit as documented in the electronic medical record.  I discussed the assessment and treatment plan with the patient. The patient was provided an opportunity to ask questions and all were answered. The patient agreed with  the plan and demonstrated an understanding of the instructions.   The patient was advised to call back or seek an in-person evaluation if the symptoms worsen or if the condition fails to improve as anticipated.   CMA or Yolanda Kelly served as scribe during this visit. History, Physical, and Plan performed by medical provider. The above documentation has been reviewed and is accurate and complete.   Yolanda Kelly, [name] 11/13/2019

## 2019-11-21 ENCOUNTER — Telehealth: Payer: Self-pay | Admitting: Physician Assistant

## 2019-11-21 ENCOUNTER — Other Ambulatory Visit: Payer: Self-pay | Admitting: Physician Assistant

## 2019-11-21 NOTE — Telephone Encounter (Signed)
Patient called in saying that a few years ago she had a nerve issue which caused her face to be paralyzed, and stated that she is experiencing some of the same symptoms as before, like facial twitching, eyelids not closing all the way and just wanted to know if she needs to schedule an appointment or just wait the symptoms out.

## 2019-11-21 NOTE — Telephone Encounter (Signed)
Noted, appt tomorrow.

## 2019-11-22 ENCOUNTER — Encounter: Payer: Self-pay | Admitting: Physician Assistant

## 2019-11-22 ENCOUNTER — Ambulatory Visit (INDEPENDENT_AMBULATORY_CARE_PROVIDER_SITE_OTHER): Payer: BC Managed Care – PPO | Admitting: Physician Assistant

## 2019-11-22 DIAGNOSIS — G514 Facial myokymia: Secondary | ICD-10-CM

## 2019-11-22 MED ORDER — PREDNISONE 20 MG PO TABS: 40.0000 mg | ORAL_TABLET | Freq: Every day | ORAL | 0 refills | Status: DC

## 2019-11-22 MED ORDER — VALACYCLOVIR HCL 1 G PO TABS: 1000.0000 mg | ORAL_TABLET | Freq: Three times a day (TID) | ORAL | 0 refills | Status: AC

## 2019-11-22 NOTE — Progress Notes (Signed)
Virtual Visit via Video   I connected with Yolanda Kelly on 11/22/19 at  3:30 PM EST by a video enabled telemedicine application and verified that I am speaking with the correct person using two identifiers. Location patient: Home Location provider: [facility], Office Persons participating in the virtual visit: Yolanda Kelly, Yolanda Motto PA-C, Corky Mull, LPN   I discussed the limitations of evaluation and management by telemedicine and the availability of in person appointments. The patient expressed understanding and agreed to proceed.  I acted as a Neurosurgeon for [name], PA-C [employer], LPN  Subjective:   HPI:   Nerve issue Pt c/o facial twitching and eyelids not closing all the way for the past 3 days. 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 She denies any lesions on her face, changes in hearing, changes in vision, significant swelling on her face, known trauma, any other prodrome.  ROS: See pertinent positives and negatives per HPI.  Patient Active Problem List   Diagnosis Date Noted  . Microcytosis 07/22/2019  . Generalized anxiety disorder 06/25/2018  . Mixed obsessional thoughts and acts 06/25/2018  . Insomnia 05/23/2018  . OCD (obsessive compulsive disorder) 03/29/2017  . Migraine without aura and without status migrainosus, not intractable 02/16/2017  . 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 Rectal mucosa prolapse 07/24/2014    Social History   Tobacco Use  . Smoking status: Never Smoker  . Smokeless tobacco: Never Used  Substance Use Topics  . Alcohol use: No    Current Outpatient Medications:  .  ALPRAZolam (XANAX) 0.25 MG tablet, Take 1 tablet (0.25 mg total) by mouth 2 (two) times daily as needed for anxiety. (Patient not taking: Reported on 11/13/2019), Disp: 30 tablet, Rfl: 1 .  baclofen (LIORESAL) 10 MG tablet, Take 1 tablet (10 mg total) by mouth 2 (two) times daily as needed for muscle spasms. (Patient  not taking: Reported on 11/22/2019), Disp: 30 each, Rfl: 0 .  predniSONE (DELTASONE) 20 MG tablet, Take 2 tablets (40 mg total) by mouth daily., Disp: 10 tablet, Rfl: 0 .  valACYclovir (VALTREX) 1000 MG tablet, Take 1 tablet (1,000 mg total) by mouth 3 (three) times daily for 7 days., Disp: 21 tablet, Rfl: 0  Allergies  Allergen Reactions  . Other Anaphylaxis    Coke and red mt. Dew, Throat closes up    Objective:   VITALS: Per patient if applicable, see vitals. GENERAL: Alert, appears well and in no acute distress. HEENT: Atraumatic, conjunctiva clear, no obvious abnormalities on inspection of external nose and ears. NECK: Normal movements of the head and neck. CARDIOPULMONARY: No increased WOB. Speaking in clear sentences. I:E ratio WNL.  MS: Moves all visible extremities without noticeable abnormality. PSYCH: Pleasant and cooperative, well-groomed. Speech normal rate and rhythm. Affect is appropriate. Insight and judgement are appropriate. Attention is focused, linear, and appropriate.  NEURO: CN grossly intact. Oriented as arrived to appointment on time with no prompting. Moves both UE equally.  SKIN: No obvious lesions, wounds, erythema, or cyanosis noted on face or hands.  Assessment and Plan:   [name] was seen today for nerve issue in face.  Diagnoses and all orders for this visit:  Facial twitching No red flags on exam today.  Face appears symmetric.  We reviewed guidelines for treatment for Ramsay Hunt Syndrome, including Valtrex and prednisone.  Because she does not have significant swelling, ear pain, lesions, or other classic Ramsay Hunt syndrome symptoms we are  going to start with empiric Valtrex treatment.  However I did give her a prescription of prednisone for her to use with the understanding that she has a low threshold to use prednisone if she does not have any improvement in her symptoms in a few days, or if she has any worsening or new symptoms.  Patient verbalized  understanding to plan.  We reviewed red flags including, but not limited to, sudden vision loss, sudden hearing loss, or other significant neurological issues that would warrant ER or ophtho eval.  Other orders -     valACYclovir (VALTREX) 1000 MG tablet; Take 1 tablet (1,000 mg total) by mouth 3 (three) times daily for 7 days. -     predniSONE (DELTASONE) 20 MG tablet; Take 2 tablets (40 mg total) by mouth daily.    . Reviewed expectations re: course of current medical issues. . Discussed self-management of symptoms. . Outlined signs and symptoms indicating need for more acute intervention. . Patient verbalized understanding and all questions were answered. Marland Kitchen Health Maintenance issues including appropriate healthy diet, exercise, and smoking avoidance were discussed with patient. . See orders for this visit as documented in the electronic medical record.  I discussed the assessment and treatment plan with the patient. The patient was provided an opportunity to ask questions and all were answered. The patient agreed with the plan and demonstrated an understanding of the instructions.   The patient was advised to call back or seek an in-person evaluation if the symptoms worsen or if the condition fails to improve as anticipated.   CMA or LPN served as scribe during this visit. History, Physical, and Plan performed by medical provider. The above documentation has been reviewed and is accurate and complete.   [place], [place] 11/22/2019

## 2019-11-26 ENCOUNTER — Other Ambulatory Visit: Payer: Self-pay

## 2019-11-26 ENCOUNTER — Encounter (HOSPITAL_BASED_OUTPATIENT_CLINIC_OR_DEPARTMENT_OTHER): Payer: Self-pay | Admitting: Emergency Medicine

## 2019-11-26 ENCOUNTER — Encounter: Payer: Self-pay | Admitting: Physician Assistant

## 2019-11-26 ENCOUNTER — Telehealth: Payer: Self-pay | Admitting: Physician Assistant

## 2019-11-26 ENCOUNTER — Emergency Department (HOSPITAL_BASED_OUTPATIENT_CLINIC_OR_DEPARTMENT_OTHER)
Admission: EM | Admit: 2019-11-26 | Discharge: 2019-11-26 | Disposition: A | Discharge status: Home / Self Care | Payer: BC Managed Care – PPO | Attending: Emergency Medicine | Admitting: Emergency Medicine

## 2019-11-26 DIAGNOSIS — K148 Other diseases of tongue: Secondary | ICD-10-CM | POA: Diagnosis not present

## 2019-11-26 DIAGNOSIS — R202 Paresthesia of skin: Secondary | ICD-10-CM | POA: Diagnosis not present

## 2019-11-26 DIAGNOSIS — Z79899 Other long term (current) drug therapy: Secondary | ICD-10-CM | POA: Diagnosis not present

## 2019-11-26 DIAGNOSIS — R448 Other symptoms and signs involving general sensations and perceptions: Secondary | ICD-10-CM

## 2019-11-26 NOTE — Telephone Encounter (Signed)
Nurse Assessment Nurse: Elroy Channel, RN, Rosey Bath Date/Time Lamount Cohen Time): 11/26/2019 9:57:04 AM Confirm and document reason for call. If symptomatic, describe symptoms. ---Caller states that she is being treated for shingles. Has blurry vision. Has numbness of tongue and tightness in right side of throat. Throat is red. No fever Has the patient had close contact with a person known or suspected to have the novel coronavirus illness OR traveled / lives in area with major community spread (including international travel) in the last 14 days from the onset of symptoms? * If Asymptomatic, screen for exposure and travel within the last 14 days. ---No Does the patient have any new or worsening symptoms? ---Yes Will a triage be completed? ---Yes Related visit to physician within the last 2 weeks? ---No Does the PT have any chronic conditions? (i.e. diabetes, asthma, this includes High risk factors for pregnancy, etc.) ---No Is the patient pregnant or possibly pregnant? (Ask all females between the ages of 17-55) ---No Is this a behavioral health or substance abuse call? ---No Guidelines Guideline Title Affirmed Question Affirmed Notes Nurse Date/Time (Eastern Time) Sore Throat [1] Sore throat is the only symptom AND [2] present > 48 hours Elroy Channel, RN, Rosey Bath 11/26/2019 9:59:11 AM Disp. Time Lamount Cohen Time) Disposition Final User PLEASE NOTE: All timestamps contained within this report are represented as [place] Standard Time. CONFIDENTIALTY NOTICE: This fax transmission is intended only for the addressee. It contains information that is legally privileged, confidential or otherwise protected from use or disclosure. If you are not the intended recipient, you are strictly prohibited from reviewing, disclosing, copying using or disseminating any of this information or taking any action in reliance on or regarding this information. If you have received this fax in error, please notify us immediately by  telephone so that we can arrange for its return to [place]. Phone: [phone], Toll-Free: [phone], Fax: [phone] Page: 2 of 2 Call Id: [id number] 11/26/2019 10:00:23 AM SEE PCP WITHIN 3 DAYS Yes Elroy Channel, RN, Suezanne Cheshire Disagree/Comply Comply Caller Understands Yes PreDisposition Call Doctor Care Advice Given Per Guideline SEE PCP WITHIN 3 DAYS: PAIN AND FEVER MEDICINES: * For pain or fever relief, take either acetaminophen or ibuprofen. DRINK PLENTY LIQUIDS: * Drink plenty of liquids. This is important to prevent dehydration. CALL BACK IF: * You become worse. CARE ADVICE given per Sore Throat (Adult) guideline.

## 2019-11-26 NOTE — Discharge Instructions (Addendum)
It was our pleasure to provide your ER care today - we hope that you feel better.  Currently your exam appears normal and nerves to the face/tongue appear to be working well.  Possibly you experienced transient nerve irritation or impingement.   Follow up with primary care doctor, and/or ENT doctor if symptoms recur, worsen or persist.   Return to ER if worse, new symptoms, acute change in speech or vision, one-sided numbness/weakness, loss of normal functional ability, or other concern.

## 2019-11-26 NOTE — ED Notes (Signed)
Pt discharged to home. Discharge instructions have been discussed with patient and/or family members. Pt verbally acknowledges understanding d/c instructions. 

## 2019-11-26 NOTE — ED Triage Notes (Signed)
Pt c/o right side tongue numbness that pt reports started pta, denies swelling, denies loss of taste, PT also c/o right side neck tightness. Pt denies difficulty breathing or swallowing. Speech clear, no s/s of distress in triage

## 2019-11-26 NOTE — ED Provider Notes (Signed)
[facility] DEPART[facility] Provider Note   CSN: [id number] Arrival date & time: 11/26/19  1027     History Chief Complaint  Patient presents with  . Tongue numbness    Yolanda Kelly is a 34 y.o. female.  Patient indicates her tongue felt numb/tingly earlier today. 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 States symptoms recurred in past 1-2 weeks, initially with bil jaw pain - was felt possibly due to TMJ, and then facial twitching and sense eye lids not closing completely. Indicates was going to eye doctor today when felt tongue numb - diffusely but sl more so on right. That numbness since arrival to ED is improving. No loss of taste or odd taste in mouth. No facial numbness or new facial weakness. No extremity numbness/weakness. No acute change in speech or vision. No ear pain, hearing loss, or tinnitus. No headache. No fever or chills. No facial or ear lesions/rash.  Has been re-started by her doctor on prednisone and valtrex in the past 3 days.   The history is provided by the patient.       Past Medical History:  Diagnosis Date  . Anxiety   . Bell's palsy   . OCD (obsessive compulsive disorder)     Patient Active Problem List   Diagnosis Date Noted  . Microcytosis 07/22/2019  . Generalized anxiety disorder 06/25/2018  . Mixed obsessional thoughts and acts 06/25/2018  . Insomnia 05/23/2018  . OCD (obsessive compulsive disorder) 03/29/2017  . Migraine without aura and without status migrainosus, not intractable 02/16/2017  . 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 Rectal mucosa prolapse 07/24/2014    Past Surgical History:  Procedure Laterality Date  . NO PAST SURGERIES       OB History   No obstetric history on file.     Family History  Problem Relation Age of Onset  . Thyroid disease Mother   . Kidney cancer  Mother   . Asthma Father   . Irritable bowel syndrome Father   . Heart disease Sister   . Diabetes Mellitus II Maternal Grandmother   . Mental illness Maternal Grandfather   . Glaucoma Paternal Grandfather   . Celiac disease Sister   . Celiac disease Son        tests high for it, not 100% sure he has it  . Breast cancer Maternal Aunt   . Colon cancer Neg Hx   . Throat cancer Neg Hx   . Rectal cancer Neg Hx     Social History   Tobacco Use  . Smoking status: Never Smoker  . Smokeless tobacco: Never Used  Substance Use Topics  . Alcohol use: No  . Drug use: No    Home Medications Prior to Admission medications   Medication Sig Start Date End Date Taking? Authorizing Provider  ALPRAZolam (XANAX) 0.25 MG tablet Take 1 tablet (0.25 mg total) by mouth 2 (two) times daily as needed for anxiety. Patient not taking: Reported on 11/13/2019 01/25/19   Donnal Moat T, PA-C  baclofen (LIORESAL) 10 MG tablet Take 1 tablet (10 mg total) by mouth 2 (two) times daily as needed for muscle spasms. Patient not taking: Reported on 11/22/2019 11/13/19   Inda Coke, PA  predniSONE (DELTASONE) 20 MG tablet Take 2 tablets (40 mg total) by mouth daily. 11/22/19   Inda Coke, PA  valACYclovir (VALTREX) 1000 MG tablet Take 1 tablet (1,000 mg  total) by mouth 3 (three) times daily for 7 days. 11/22/19 11/29/19  [name], PA    Allergies    Other  Review of Systems   Review of Systems  Constitutional: Negative for fever.  HENT: Negative for hearing loss and tinnitus.   Eyes: Negative for pain and visual disturbance.  Respiratory: Negative for cough.   Skin: Negative for rash.  Neurological: Negative for speech difficulty, weakness and headaches.    Physical Exam Updated Vital Signs BP 118/79 (BP Location: Right Arm)   Pulse 76   Temp 98.6 F (37 C) (Oral)   Resp 16   Ht 1.575 m (5\' 2" )   Wt 68 kg   LMP 11/09/2019 (Approximate)   SpO2 100%   BMI 27.44 kg/m   Physical  Exam Vitals and nursing note reviewed.  Constitutional:      Appearance: Normal appearance. She is well-developed.  HENT:     Head: Atraumatic.     Right Ear: Tympanic membrane, ear canal and external ear normal.     Left Ear: Tympanic membrane, ear canal and external ear normal.     Ears:     Comments: No mastoid tenderness.     Nose: Nose normal.     Mouth/Throat:     Mouth: Mucous membranes are moist.  Eyes:     Extraocular Movements: Extraocular movements intact.     Conjunctiva/sclera: Conjunctivae normal.     Pupils: Pupils are equal, round, and reactive to light.  Neck:     Trachea: No tracheal deviation.  Cardiovascular:     Rate and Rhythm: Normal rate.     Pulses: Normal pulses.  Pulmonary:     Effort: Pulmonary effort is normal. No respiratory distress.  Musculoskeletal:        General: No swelling.     Cervical back: Normal range of motion and neck supple. No rigidity. No muscular tenderness.  Skin:    General: Skin is warm and dry.     Findings: No rash.  Neurological:     Mental Status: She is alert.     Cranial Nerves: No cranial nerve deficit.     Comments: Alert, speech normal. No facial asymmetry or weakness. Is able to fully close eyelids bil. Tongue protrudes in midline, normal appearance. Sensation grossly intact. Moves bil ext purposefully. Steady gait.   Psychiatric:        Mood and Affect: Mood normal.     ED Results / Procedures / Treatments   Labs (all labs ordered are listed, but only abnormal results are displayed) Labs Reviewed - No data to display  EKG None  Radiology No results found.  Procedures Procedures (including critical care time)  Medications Ordered in ED Medications - No data to display  ED Course  I have reviewed the triage vital signs and the nursing notes.  Pertinent labs & imaging results that were available during my care of the patient were reviewed by me and considered in my medical decision making (see chart  for details).    MDM Rules/Calculators/A&P                      Reviewed nursing notes and prior charts for additional history.  Recent pcp/teleheallth visits reviewed.   Currently, exam is normal, no focal deficit noted. Normal appearance to face, ears, eyes, tongue/mouth.   Rec pcp f/u. If recurrent problems with ear, tinnitus, etc, ENT f/u.   Patient currently appears stable for d/c.  Final Clinical Impression(s) / ED Diagnoses Final diagnoses:  None    Rx / DC Orders ED Discharge Orders    None       Cathren Laine, MD 11/26/19 1109

## 2019-11-28 ENCOUNTER — Encounter: Payer: Self-pay | Admitting: Physician Assistant

## 2019-12-04 ENCOUNTER — Encounter: Payer: Self-pay | Admitting: Physician Assistant

## 2019-12-04 ENCOUNTER — Ambulatory Visit (INDEPENDENT_AMBULATORY_CARE_PROVIDER_SITE_OTHER): Payer: BC Managed Care – PPO | Admitting: Physician Assistant

## 2019-12-04 DIAGNOSIS — R519 Headache, unspecified: Secondary | ICD-10-CM

## 2019-12-04 DIAGNOSIS — Z7689 Persons encountering health services in other specified circumstances: Secondary | ICD-10-CM | POA: Diagnosis not present

## 2019-12-04 NOTE — Progress Notes (Signed)
Virtual Visit via Video   I connected with Yolanda Kelly on 12/04/19 at 12:00 PM EDT by a video enabled telemedicine application and verified that I am speaking with the correct person using two identifiers. Location patient: Home Location provider: [facility], Office Persons participating in the virtual visit: Yolanda Kelly, Yolanda Motto PA-C, Yolanda Mull, LPN   I discussed the limitations of evaluation and management by telemedicine and the availability of in person appointments. The patient expressed understanding and agreed to proceed.  I acted as a Neurosurgeon for [name], [name], LPN  Subjective:   HPI:   Muscle pain Pt c/o muscle pain in face and jaw and shooting pains to head and neck. Pt still having facial spasms and twitches across face. Unable to keep left eye closed, has been taping it a night. Pt finished Valtrex and Prednisone did help with swelling and tongue, but now is symptoms are coming back. Was seen by me on 3/3 for jaw pain and then 3/12 for facial twitching. Was started on baclofen, and then given prednisone and valtrex. She ended up going to the ED on 3/16 due to numbness of tongue, and was told that everything appeared normal and no further work-up was needed. 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 No swelling on her face. No tongue numbness.   Having consistent jaw popping on the R side. Picking up a mouthguard next week from her dentist.    Had issues with muscle pain and twitching in her body in 2018, and saw Dr. Everlena Cooper, had negative work-up.  Sleep concerns Her brother is a doctor and told her that she should be screened for possible narcolepsy. She feels like her insomnia has been consistently bad at night. History of sleep walking. Does have history of falling asleep randomly at times, but none recently.   ROS: See pertinent positives and negatives per HPI.  Patient Active Problem List   Diagnosis Date Noted  .  Microcytosis 07/22/2019  . Generalized anxiety disorder 06/25/2018  . Mixed obsessional thoughts and acts 06/25/2018  . Insomnia 05/23/2018  . OCD (obsessive compulsive disorder) 03/29/2017  . Migraine without aura and without status migrainosus, not intractable 02/16/2017  . 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 Rectal mucosa prolapse 07/24/2014    Social History   Tobacco Use  . Smoking status: Never Smoker  . Smokeless tobacco: Never Used  Substance Use Topics  . Alcohol use: No    Current Outpatient Medications:  .  ALPRAZolam (XANAX) 0.25 MG tablet, Take 1 tablet (0.25 mg total) by mouth 2 (two) times daily as needed for anxiety. (Patient not taking: Reported on 11/13/2019), Disp: 30 tablet, Rfl: 1  Allergies  Allergen Reactions  . Other Anaphylaxis    Coke and red mt. Dew, Throat closes up    Objective:   VITALS: Per patient if applicable, see vitals. GENERAL: Alert, appears well and in no acute distress. HEENT: Atraumatic, conjunctiva clear, no obvious abnormalities on inspection of external nose and ears. NECK: Normal movements of the head and neck. CARDIOPULMONARY: No increased WOB. Speaking in clear sentences. I:E ratio WNL.  MS: Moves all visible extremities without noticeable abnormality. PSYCH: Pleasant and cooperative, well-groomed. Speech normal rate and rhythm. Affect is appropriate. Insight and judgement are appropriate. Attention is focused, linear, and appropriate.  NEURO: CN grossly intact. Oriented as arrived to appointment on time with no prompting. Moves both UE equally.  SKIN: No obvious lesions, wounds,  erythema, or cyanosis noted on face or hands.  Assessment and Plan:   Nakiyah was seen today for muscle pain.  Diagnoses and all orders for this visit:  Facial pain Unclear etiology. She is currently in no apparent distress and without obvious neuro deficit. Appointment made with Dr. Lynne Leader to help [place] figure out next steps. Patient  understands worsening precautions. -     Ambulatory referral to Sports Medicine  Sleep concern Patient would like referral to discuss concerns regarding possible narcolepsy. -     Ambulatory referral to Sleep Studies  . Reviewed expectations re: course of current medical issues. . Discussed self-management of symptoms. . Outlined signs and symptoms indicating need for more acute intervention. . Patient verbalized understanding and all questions were answered. Marland Kitchen Health Maintenance issues including appropriate healthy diet, exercise, and smoking avoidance were discussed with patient. . See orders for this visit as documented in the electronic medical record.  I discussed the assessment and treatment plan with the patient. The patient was provided an opportunity to ask questions and all were answered. The patient agreed with the plan and demonstrated an understanding of the instructions.   The patient was advised to call back or seek an in-person evaluation if the symptoms worsen or if the condition fails to improve as anticipated.   CMA or LPN served as scribe during this visit. History, Physical, and Plan performed by medical provider. The above documentation has been reviewed and is accurate and complete.  [place], [place] 12/04/2019

## 2019-12-06 ENCOUNTER — Encounter: Payer: Self-pay | Admitting: Family Medicine

## 2019-12-06 ENCOUNTER — Other Ambulatory Visit: Payer: Self-pay

## 2019-12-06 ENCOUNTER — Ambulatory Visit (INDEPENDENT_AMBULATORY_CARE_PROVIDER_SITE_OTHER): Payer: BC Managed Care – PPO | Admitting: Family Medicine

## 2019-12-06 VITALS — BP 110/80 | HR 86 | Ht 62.0 in | Wt 153.4 lb

## 2019-12-06 DIAGNOSIS — B0221 Postherpetic geniculate ganglionitis: Secondary | ICD-10-CM | POA: Diagnosis not present

## 2019-12-06 DIAGNOSIS — R519 Headache, unspecified: Secondary | ICD-10-CM

## 2019-12-06 MED ORDER — GABAPENTIN 300 MG PO CAPS: 300.0000 mg | ORAL_CAPSULE | Freq: Three times a day (TID) | ORAL | 1 refills | Status: DC | PRN

## 2019-12-06 MED ORDER — PREDNISONE 5 MG (48) PO TBPK: ORAL_TABLET | ORAL | 0 refills | Status: DC

## 2019-12-06 MED ORDER — VALACYCLOVIR HCL 1 G PO TABS: 1000.0000 mg | ORAL_TABLET | Freq: Three times a day (TID) | ORAL | 2 refills | Status: DC

## 2019-12-06 NOTE — Patient Instructions (Signed)
Thank you for coming in today. Restart prednisone and valtrex if it flairs up again.   Try gabapentin mostly at bedtime as needed for nerve pain or annoying sensation.   Try baclofen for muscles spasms mostly at bedtime.   If the eye dryness continues I recommend you see an ophthalmologist.  Next potential steps are other medicines.   Also could consider Brain MRI and Face nerve MRI/Ct scan.  Keep me updated.    Trigeminal Neuralgia  Trigeminal neuralgia is a nerve disorder that causes severe pain on one side of the face. The pain may last from a few seconds to several minutes. The pain is usually only on one side of the face. Symptoms may occur for days, weeks, or months and then go away for months or years. The pain may return and be worse than before. What are the causes? This condition is caused by damage or pressure to a nerve in the head that is called the trigeminal nerve. An attack can be triggered by:  Talking.  Chewing.  Putting on makeup.  Washing your face.  Shaving your face.  Brushing your teeth.  Touching your face. What increases the risk? You are more likely to develop this condition if you:  Are 36 years of age or older.  Are female. What are the signs or symptoms? The main symptom of this condition is severe pain in the:  Jaw.  Lips.  Eyes.  Nose.  Scalp.  Forehead.  Face. The pain may be:  Intense.  Stabbing.  Electric.  Shock-like. How is this diagnosed? This condition is diagnosed with a physical exam. A CT scan or an MRI may be done to rule out other conditions that can cause facial pain. How is this treated? This condition may be treated with:  Avoiding the things that trigger your symptoms.  Taking prescription medicines (anticonvulsants).  Having surgery. This may be done in severe cases if other medical treatment does not provide relief.  Having procedures such as ablation, thermal, or radiation therapy. It may  take up to one month for treatment to start relieving the pain. Follow these instructions at home: Managing pain  Learn as much as you can about how to manage your pain. Ask your health care provider if a pain specialist would be helpful.  Consider talking with a mental health care provider (psychologist) about how to cope with the pain.  Consider joining a pain support group. General instructions  Take over-the-counter and prescription medicines only as told by your health care provider.  Avoid the things that trigger your symptoms. It may help to: ? Chew on the unaffected side of your mouth. ? Avoid touching your face. ? Avoid blasts of hot or cold air.  Follow your treatment plan as told by your health care provider. This may include: ? Cognitive or behavioral therapy. ? Gentle, regular exercise. ? Meditation or yoga. ? Aromatherapy.  Keep all follow-up visits as told by your health care provider. You may need to be monitored closely to make sure treatment is working well for you. Where to find more information  Facial Pain Association: fpa-support.org Contact a health care provider if:  Your medicine is not helping your symptoms.  You have side effects from the medicine used for treatment.  You develop new, unexplained symptoms, such as: ? Double vision. ? Facial weakness. ? Facial numbness. ? Changes in hearing or balance.  You feel depressed. Get help right away if:  Your pain is severe  and is not getting better.  You develop suicidal thoughts. If you ever feel like you may hurt yourself or others, or have thoughts about taking your own life, get help right away. You can go to your nearest emergency department or call:  Your local emergency services (911 in the [place]  A suicide crisis helpline, such as the National Suicide Prevention Lifeline at 414-543-2679. This is open 24 hours a day. Summary  Trigeminal neuralgia is a nerve disorder that causes severe  pain on one side of the face. The pain may last from a few seconds to several minutes.  This condition is caused by damage or pressure to a nerve in the head that is called the trigeminal nerve.  Treatment may include avoiding the things that trigger your symptoms, taking medicines, or having surgery or procedures. It may take up to one month for treatment to start relieving the pain.  Avoid the things that trigger your symptoms.  Keep all follow-up visits as told by your health care provider. You may need to be monitored closely to make sure treatment is working well for you. This information is not intended to replace advice given to you by your health care provider. Make sure you discuss any questions you have with your health care provider. Document Revised: 07/16/2018 Document Reviewed: 07/16/2018 Elsevier Patient Education  2020 [employer].

## 2019-12-06 NOTE — Progress Notes (Signed)
Subjective:    I'm seeing this patient as a consultation for:  Yolanda Kelly, [name] . Note will be routed back to referring provider/PCP.  CC: Facial pain  I, Christoper Fabian, LAT, ATC, am serving as scribe for Dr. Clementeen Graham.  HPI: Yolanda Kelly has a history of right-sided Ramsay Hunt syndrome and has had significant problems revolving around facial pain paresthesias twitching dysfunction and discomfort.  She was seen in the ED on March 16 and with her primary care provider on March 24.  At that time she had facial pain into her jaw and shooting pains into her neck and head and neck along with facial spasms and facial twitching.  She was having difficulty keeping her her left eye closed.  She has been treated in the past with baclofen prednisone and Valtrex.  She did have a relevant work-up with a neurologist in 2018 for muscle pain and twitching which resulted in a negative work-up.  Her most recent symptoms include R ear pain and sharp pain that radiates along the R side of her head and into her jaw.  The shooting pain occurs only when she blinks.  She notes that her pain worsens throughout the day.  She has also been having some intermittent numbness in her tongue but this has mostly resolved x the past few days.  Past medical history, Surgical history, Family history, Social history, Allergies, and medications have been entered into the medical record, reviewed.   Review of Systems: No new headache, visual changes, nausea, vomiting, diarrhea, constipation, dizziness, abdominal pain, skin rash, fevers, chills, night sweats, weight loss, swollen lymph nodes, body aches, joint swelling, muscle aches, chest pain, shortness of breath, mood changes, visual or auditory hallucinations.   Objective:    Vitals:   12/06/19 0915  BP: 110/80  Pulse: 86  SpO2: 97%   General: Well Developed, well nourished, and in no acute distress.  Neuro/Psych: Alert and oriented x3, extra-ocular muscles intact, able to  move all 4 extremities, sensation grossly intact. Skin: Warm and dry, no rashes noted.  Respiratory: Not using accessory muscles, speaking in full sentences, trachea midline.  Cardiovascular: Pulses palpable, no extremity edema. Abdomen: Does not appear distended. MSK:  Normal facial motion. No significant eyelid lag.   Lab and Radiology Results  EXAM: CT HEAD WITHOUT CONTRAST  TECHNIQUE: Contiguous axial images were obtained from the base of the skull through the vertex without intravenous contrast.  COMPARISON:  None.  FINDINGS: Brain: No evidence of acute infarction, hemorrhage, hydrocephalus, extra-axial collection or mass lesion/mass effect.  Vascular: No hyperdense vessel or unexpected calcification.  Skull: Normal. Negative for fracture or focal lesion.  Sinuses/Orbits: No acute finding.  Other: None  IMPRESSION: Negative non contrasted CT appearance of the brain   Electronically Signed   By: Jasmine Pang M.D.   On: 11/04/2017 01:14  I, Clementeen Graham, personally (independently) visualized and performed the interpretation of the images attached in this note.   NCV/EMG 04/06/2017:  Patient Complaints: This is a 34 year-old female referred for evaluation of muscle cramps, spasms, and paresthesias following viral illness.  NCV & EMG Findings: Extensive electrodiagnostic testing of the right upper extremity and lower extremity shows:  1. All sensory responses including the right median, ulnar, mixed palmer, sural, and superficial peroneal sensory responses are within normal limits. 2. 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 3. There is no evidence of active or chronic motor axon loss changes affecting any of the  tested muscles. Motor unit configuration and recruitment pattern is within normal limits.  Impression: This is a normal study of the right upper and lower extremity. In particular, there is no evidence of carpal tunnel syndrome, sensorimotor polyneuropathy, or cervical/lumbosacral radiculopathy.  Incidentally, there is a right proximal Martin-Gruber anastomosis, a normal variant.   ___________________________ Narda Amber, DO  LABS: Lab Results  Component Value Date   VITAMINB12 1,015 04/12/2019   Lab Results  Component Value Date   TSH 1.01 04/12/2019   Lab Results  Component Value Date   CKTOTAL 77 04/03/2017   Lab Results  Component Value Date   WBC 11.3 07/17/2019   HGB 11.8 07/31/2019   HCT 39.1 07/31/2019   MCV 73.6 (L) 07/31/2019   PLT 387 07/17/2019     Chemistry      Component Value Date/Time   NA 139 04/12/2019 1457   K 4.5 04/12/2019 1457   CL 103 04/12/2019 1457   CO2 26 04/12/2019 1457   BUN 14 04/12/2019 1457   CREATININE 0.68 04/12/2019 1457      Component Value Date/Time   CALCIUM 9.7 04/12/2019 1457   ALKPHOS 72 05/23/2018 0944   AST 16 04/12/2019 1457   ALT 14 04/12/2019 1457   BILITOT 0.3 04/12/2019 1457     Component     Latest Ref Rng & Units 04/12/2019 07/31/2019  RBC     3.80 - 5.10 Million/uL  5.31 (H)  Hemoglobin     11.7 - 15.5 g/dL  11.8  HCT     35.0 - 45.0 %  39.1  MCV     80.0 - 100.0 fL  73.6 (L)  MCH     27.0 - 33.0 pg  22.2 (L)  RDW     11.0 - 15.0 %  15.3 (H)  Hgb A     >96.0 %  97.1  Fetal Hemoglobin Testing     0.0 - 1.9 %  <1.0  Hemoglobin A2 - HGBRFX     1.8 - 3.5 %  1.9  Interpretation         Iron     40 - 190 mcg/dL 64   TIBC     250 - 450 mcg/dL (calc) 298   %SAT     16 - 45 % (calc) 21   Ferritin     16 - 154 ng/mL 211 (H)   Comment: The hemoglobin pattern is normal. No variant is identified.  .  Alpha thalassemia is possible if iron deficiency is ruled out.   Impression and  Recommendations:    Assessment and Plan: 34 y.o. female with persistent intermittent facial pain and twitching.  Symptoms are occur on both sides and she has history of right-sided Ramsay Hunt.  It is possible that she has some component of Bell's palsy and/or trigeminal neuralgia.  Discussed possible work-up strategies for the future.  In the meantime some pragmatic approach.  We will try gabapentin especially at bedtime.  I have prescribed a course of Valtrex and prednisone to take if symptoms flareup again.  Should keep me updated.  If needed would consider MRI in the future.  Also could consider other medications as well.  Recheck back with me as needed..   Meds ordered this encounter  Medications  . predniSONE (STERAPRED UNI-PAK  48 TAB) 5 MG (48) TBPK tablet    Sig: 12 day dosepack po    Dispense:  48 tablet    Refill:  0  . valACYclovir (VALTREX) 1000 MG tablet    Sig: Take 1 tablet (1,000 mg total) by mouth 3 (three) times daily.    Dispense:  21 tablet    Refill:  2  . gabapentin (NEURONTIN) 300 MG capsule    Sig: Take 1 capsule (300 mg total) by mouth 3 (three) times daily as needed (for nerve pain).    Dispense:  90 capsule    Refill:  1    Discussed warning signs or symptoms. Please see discharge instructions. Patient expresses understanding.   The above documentation has been reviewed and is accurate and complete Clementeen Graham

## 2019-12-09 ENCOUNTER — Telehealth: Payer: Self-pay | Admitting: Family Medicine

## 2019-12-09 ENCOUNTER — Ambulatory Visit: Payer: BC Managed Care – PPO | Admitting: Physician Assistant

## 2019-12-09 ENCOUNTER — Telehealth: Payer: Self-pay

## 2019-12-09 ENCOUNTER — Other Ambulatory Visit: Payer: Self-pay

## 2019-12-09 DIAGNOSIS — R200 Anesthesia of skin: Secondary | ICD-10-CM

## 2019-12-09 DIAGNOSIS — M6281 Muscle weakness (generalized): Secondary | ICD-10-CM

## 2019-12-09 NOTE — Telephone Encounter (Signed)
Spoke to pt told her Lelon Mast said she is happy to see her, but I do recommend that she reach out to her neurologist, Dr. Everlena Cooper, to see if she can secure an appointment with him in case we need his opinion. Pt verbalized understanding and said she will contact Dr. Everlena Cooper and make an appt with him instead. Told her okay.

## 2019-12-09 NOTE — Telephone Encounter (Signed)
Returned pt's call and advised her to contact her PCP regarding new symptoms as Dr. Denyse Amass is out of the office all week and won't return until Monday, December 16, 2019.  Pt verbalizes understanding.

## 2019-12-09 NOTE — Telephone Encounter (Signed)
Referral placed to neurologist. Patient notified that someone will contact her to set up an appointment.

## 2019-12-09 NOTE — Telephone Encounter (Signed)
Patient called because she was experiencing some new "symptoms" this weekend. She has been having muscle spasams in her legs but is now having muscle cramping. Yesterday one of her legs did not want to work for a few minutes and she has been having involuntary finger movements.                             She did not know if she should wait it out of if these changes are something she should be seen for.  Please advise.

## 2019-12-09 NOTE — Telephone Encounter (Signed)
Please see message and advise 

## 2019-12-09 NOTE — Telephone Encounter (Signed)
Pt called states she saw [facility] 04/2017.  Pt states recurring involuntary movements hands & recurring headaches. Pt states she is willing to see whomever can see her the soonest. Please advise if new referral needed & providers ok to see.

## 2019-12-09 NOTE — Telephone Encounter (Signed)
I am happy to see her, but I do recommend that she reach out to her neurologist, Dr. Everlena Cooper, to see if she can secure an appointment with him in case we need his opinion.

## 2019-12-09 NOTE — Telephone Encounter (Signed)
Patient called back after calling her Neurologist and they told her she would need a new referral since she hasn't been seen since 2018. Please advise.

## 2019-12-09 NOTE — Telephone Encounter (Signed)
Called patient to let her know we would need a new referral since it's been so long since last visit. Patient said she will get referral sent to [place]. Thanks!

## 2019-12-16 ENCOUNTER — Other Ambulatory Visit: Payer: Self-pay

## 2019-12-16 ENCOUNTER — Ambulatory Visit (INDEPENDENT_AMBULATORY_CARE_PROVIDER_SITE_OTHER): Payer: BC Managed Care – PPO | Admitting: Family Medicine

## 2019-12-16 ENCOUNTER — Encounter: Payer: Self-pay | Admitting: Family Medicine

## 2019-12-16 VITALS — BP 120/84 | HR 97 | Ht 62.0 in | Wt 152.0 lb

## 2019-12-16 DIAGNOSIS — R253 Fasciculation: Secondary | ICD-10-CM

## 2019-12-16 DIAGNOSIS — M791 Myalgia, unspecified site: Secondary | ICD-10-CM

## 2019-12-16 DIAGNOSIS — R5383 Other fatigue: Secondary | ICD-10-CM | POA: Diagnosis not present

## 2019-12-16 MED ORDER — BACLOFEN 10 MG PO TABS: 10.0000 mg | ORAL_TABLET | Freq: Every evening | ORAL | 1 refills | Status: DC | PRN

## 2019-12-16 NOTE — Progress Notes (Signed)
Yolanda Kelly is a 34 y.o. female who presents to [place] [facility] today for muscle spasm in legs Xcouple of weeks, started with shaking/vibrating but the spasms started worse Saturday. Worse at night. States that the legs would get horrible leg cramps and right leg gave out on her. Patient was last seen by Dr. Georgina Snell on 12/06/2019 for facial pain. Patient does have an appointment in the near future at [facility] for fatigue and possible narcolepsy work-up.  She notes twitchy crampy spasming sensations in her legs especially worse at bedtime that does interfere with sleep.  She takes baclofen intermittently which has been helpful.  She takes 10 mg but thinks maybe it is not enough.   Pertinent review of systems: No fevers or chills  Relevant historical information: History of Ramsay Hunt syndrome in the past.  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   MSK: Lower extremity bilaterally normal-appearing nontender normal motion and strength.  Normal reflexes.    Lab and Radiology Results Lab Results  Component Value Date   TSH 1.01 04/12/2019       Assessment and Plan: 34 y.o. female with muscle spasms and twitching.  Bothersome mostly at bedtime.  Symptoms not completely consistent with restless leg syndrome but that certainly is a possibility.  Doubtful for serious etiology.  Its been sometime since her most recent lab assessment for possible causes of twitching.  We will repeat some labs as listed below CK sed rate CMP magnesium and TSH.  Recommend increasing baclofen to up to 20 mg at bedtime.  Discussed the possibility of restless leg syndrome.  Appreciate sleep patient to neurology  further work-up on this issue.  She may benefit from sleep study in addition to what I assume will be a sleep latency test.  This would help put to bed (so to speak) a diagnosis of restless leg syndrome.  Reassured patient that serious life-threatening etiology such as ALS is very unlikely given her relatively normal nerve conduction study 2-3 years ago.  Check back with me as needed.  Happy to continue to follow this patient along with these issues.   PDMP not reviewed this encounter. Orders Placed This Encounter  Procedures  . CK    Standing Status:   Future    Number of Occurrences:   1    Standing Expiration Date:   12/15/2020  . Sedimentation rate    Standing Status:   Future    Number of Occurrences:   1    Standing Expiration Date:   12/15/2020  . Comp Met (CMET)    Standing Status:   Future    Number of Occurrences:   1    Standing Expiration Date:   12/15/2020  . Magnesium    Standing Status:   Future    Number of Occurrences:   1    Standing Expiration Date:   03/16/2020  . TSH    Standing Status:   Future    Number of Occurrences:   1    Standing Expiration Date:   03/16/2020   Meds ordered this encounter  Medications  . baclofen (LIORESAL) 10 MG tablet  Sig: Take 1-2 tablets (10-20 mg total) by mouth at bedtime as needed for muscle spasms.    Dispense:  30 each    Refill:  1     Discussed warning signs or symptoms. Please see discharge instructions. Patient expresses understanding.   The above documentation has been reviewed and is accurate and complete Lynne Leader

## 2019-12-16 NOTE — Patient Instructions (Signed)
Thank you for coming in today. Continue baclofen as needed especially at bedtime.  Ok to use gabapentin as well.  Ok to take both together if needed but it will make you sleepy.  Get labs today.  I should have most labs back tomorrow.  Follow up with Neurology as scheduled.  Talk to them also about the twitching. Make sure they see your EMG report and my notes.   Keep me updated.  I don't think this is dangerous but I am making sure I am not missing anything obvious or bad.    Restless Legs Syndrome Restless legs syndrome is a condition that causes uncomfortable feelings or sensations in the legs, especially while sitting or lying down. The sensations usually cause an overwhelming urge to move the legs. The arms can also sometimes be affected. The condition can range from mild to severe. The symptoms often interfere with a person's ability to sleep. What are the causes? The cause of this condition is not known. What increases the risk? The following factors may make you more likely to develop this condition:  Being older than 50.  Pregnancy.  Being a woman. In general, the condition is more common in women than in men.  A family history of the condition.  Having iron deficiency.  Overuse of caffeine, nicotine, or alcohol.  Certain medical conditions, such as kidney disease, Parkinson's disease, or nerve damage.  Certain medicines, such as those for high blood pressure, nausea, colds, allergies, depression, and some heart conditions. What are the signs or symptoms? The main symptom of this condition is uncomfortable sensations in the legs, such as:  Pulling.  Tingling.  Prickling.  Throbbing.  Crawling.  Burning. Usually, the sensations:  Affect both sides of the body.  Are worse when you sit or lie down.  Are worse at night. These may wake you up or make it difficult to fall asleep.  Make you have a strong urge to move your legs.  Are temporarily relieved  by moving your legs. The arms can also be affected, but this is rare. People who have this condition often have tiredness during the day because of their lack of sleep at night. How is this diagnosed? This condition may be diagnosed based on:  Your symptoms.  Blood tests. In some cases, you may be monitored in a sleep lab by a specialist (a sleep study). This can detect any disruptions in your sleep. How is this treated? This condition is treated by managing the symptoms. This may include:  Lifestyle changes, such as exercising, using relaxation techniques, and avoiding caffeine, alcohol, or tobacco.  Medicines. Anti-seizure medicines may be tried first. Follow these instructions at home:     General instructions  Take over-the-counter and prescription medicines only as told by your health care provider.  Use methods to help relieve the uncomfortable sensations, such as: ? Massaging your legs. ? Walking or stretching. ? Taking a cold or hot bath.  Keep all follow-up visits as told by your health care provider. This is important. Lifestyle  Practice good sleep habits. For example, go to bed and get up at the same time every day. Most adults should get 7-9 hours of sleep each night.  Exercise regularly. Try to get at least 30 minutes of exercise most days of the week.  Practice ways of relaxing, such as yoga or meditation.  Avoid caffeine and alcohol.  Do not use any products that contain nicotine or tobacco, such as cigarettes and e-cigarettes.  If you need help quitting, ask your health care provider. Contact a health care provider if:  Your symptoms get worse or they do not improve with treatment. Summary  Restless legs syndrome is a condition that causes uncomfortable feelings or sensations in the legs, especially while sitting or lying down.  The symptoms often interfere with a person's ability to sleep.  This condition is treated by managing the symptoms. You may  need to make lifestyle changes or take medicines. This information is not intended to replace advice given to you by your health care provider. Make sure you discuss any questions you have with your health care provider. Document Revised: 09/18/2017 Document Reviewed: 09/18/2017 Elsevier Patient Education  [place].

## 2019-12-17 ENCOUNTER — Ambulatory Visit (INDEPENDENT_AMBULATORY_CARE_PROVIDER_SITE_OTHER): Payer: BC Managed Care – PPO | Admitting: Psychiatry

## 2019-12-17 DIAGNOSIS — F422 Mixed obsessional thoughts and acts: Secondary | ICD-10-CM | POA: Diagnosis not present

## 2019-12-17 LAB — COMPREHENSIVE METABOLIC PANEL
ALT: 12 U/L (ref 0–35)
AST: 16 U/L (ref 0–37)
Albumin: 4.4 g/dL (ref 3.5–5.2)
Alkaline Phosphatase: 70 U/L (ref 39–117)
BUN: 10 mg/dL (ref 6–23)
CO2: 25 mEq/L (ref 19–32)
Calcium: 9.2 mg/dL (ref 8.4–10.5)
Chloride: 106 mEq/L (ref 96–112)
Creatinine, Ser: 0.71 mg/dL (ref 0.40–1.20)
GFR: 93.07 mL/min (ref >60.00)
Glucose, Bld: 97 mg/dL (ref 70–99)
Potassium: 3.9 mEq/L (ref 3.5–5.1)
Sodium: 138 mEq/L (ref 135–145)
Total Bilirubin: 0.3 mg/dL (ref 0.2–1.2)
Total Protein: 7.3 g/dL (ref 6.0–8.3)

## 2019-12-17 LAB — TSH: TSH: 1.12 u[IU]/mL (ref 0.35–4.50)

## 2019-12-17 LAB — SEDIMENTATION RATE: Sed Rate: 6 mm/hr (ref 0–20)

## 2019-12-17 LAB — CK: Total CK: 76 U/L (ref 7–177)

## 2019-12-17 LAB — MAGNESIUM: Magnesium: 2.1 mg/dL (ref 1.5–2.5)

## 2019-12-17 NOTE — Progress Notes (Signed)
Labs all fortunately look normal.

## 2019-12-17 NOTE — Progress Notes (Signed)
[facility] Counselor/Therapist Progress Note  Patient ID: Yolanda Kelly, MRN: [id number],    Date: 12/17/2019  Time Spent: 52 minutes start time 12:58 PM end time 1:50 PM Virtual Visit via Telephone Note Connected with patient by a video enabled telemedicine/telehealth application, with their informed consent, and verified patient privacy and that I am speaking with the correct person using two identifiers. I discussed the limitations, risks, security and privacy concerns of performing psychotherapy and management service by telephone and the availability of in person appointments. I also discussed with the patient that there may be a patient responsible charge related to this service. The patient expressed understanding and agreed to proceed. I discussed the treatment planning with the patient. The patient was provided an opportunity to ask questions and all were answered. The patient agreed with the plan and demonstrated an understanding of the instructions. The patient was advised to call  our office if  symptoms worsen or feel they are in a crisis state and need immediate contact.   Therapist Location: office Patient Location: home    Treatment Type: Individual Therapy  Reported Symptoms: anxiety, obsessive thinking  Mental Status Exam:  Appearance:   Well Groomed     Behavior:  Appropriate  Motor:  Normal  Speech/Language:   Normal Rate  Affect:  Appropriate  Mood:  anxious  Thought process:  normal  Thought content:    WNL  Sensory/Perceptual disturbances:    WNL  Orientation:  oriented to person, place, time/date and situation  Attention:  Good  Concentration:  Good  Memory:  WNL  Fund of knowledge:   Good  Insight:    Good  Judgment:   Good  Impulse Control:  Good   Risk Assessment: Danger to Self:  No Self-injurious Behavior: No Danger to Others: No Duty to Warn:no Physical Aggression / Violence:No  Access to Firearms a concern: No  Gang Involvement:No    Subjective: Met with patient via virtual session, through [employer].  Patient shared that she got sick again and that helped her get things back in perspective.  Patient reported that the OCD is improving but she shared that her anxiety has increased due to her health issues. She explained she has multiple doctors appointments again to try and figure out what is happening with her.  She shared she did have an episode when she was having the compulsive behaviors but they are doing better.  Patient was finally able to share she has lots of guilt about the whole situation concerning not being able to be the mother that she wants to be a for her children.  Patient explained she is saying her oldest son trying to go over on certain things and then she starts feeling bad that he has to feel he has to care for the others.  Patient did share that they are communicating about that when they see it and encouraging him to realize that she is still can be okay and that he does not have to take care of his younger siblings.  Patient stated she feels that that is why the issue is probably so much for her and she starts feeling  different emotions and then some of the obsessive thinking comes back.  Patient was encouraged to start writing down the positive things that she sees when she interacts with her children and watching her husband with them.  She was encouraged to read that list when she starts having doubts about how she is doing  at the mother.  She was also encouraged to communicate her concerns with her children and to reassure them so they know she is still going to be okay.  Discussed the importance of the communication and giving her children words to express her feelings and different tools to help them release him appropriately.  She was encouraged to recognize that she can do that because she has so many of her own coping skills and  Knows CBT skills to talk herself through different situation as well.  Patient  reported feeling better at the end of session and recognizing she needs to get back to her once a month session so that she can continue dealing with the emotions she is having tied to the situation.  Interventions: Cognitive Behavioral Therapy and Solution-Oriented/Positive Psychology  Diagnosis:   ICD-10-CM   1. Mixed obsessional thoughts and acts  F42.2     Plan: Patient is to utilize CBT and coping skills to decrease anxiety symptoms and manage obsessive thinking appropriately.  Patient is to follow plans to communicate with her children to help her decrease some of her guilt concerning situation.  Patient is also to write down the positive things that she sees occurring within her family even with her sickness. Long-term goal: Enhance ability to handle effectively the full variety of life's anxieties Short-term goal: Identify an anxiety coping mechanism that has been successful in the past and increase its use  Lina Sayre, [facility]

## 2019-12-18 ENCOUNTER — Encounter: Payer: Self-pay | Admitting: Neurology

## 2019-12-18 ENCOUNTER — Other Ambulatory Visit: Payer: Self-pay

## 2019-12-18 ENCOUNTER — Ambulatory Visit (INDEPENDENT_AMBULATORY_CARE_PROVIDER_SITE_OTHER): Payer: BC Managed Care – PPO | Admitting: Neurology

## 2019-12-18 VITALS — BP 115/76 | HR 71 | Temp 98.3°F | Ht 62.0 in | Wt 150.0 lb

## 2019-12-18 DIAGNOSIS — R6889 Other general symptoms and signs: Secondary | ICD-10-CM | POA: Diagnosis not present

## 2019-12-18 DIAGNOSIS — G4719 Other hypersomnia: Secondary | ICD-10-CM | POA: Diagnosis not present

## 2019-12-18 DIAGNOSIS — Z79899 Other long term (current) drug therapy: Secondary | ICD-10-CM

## 2019-12-18 NOTE — Progress Notes (Signed)
Subjective:    Patient ID: Yolanda Kelly is a 34 y.o. female.  HPI     Huston Foley, MD, PhD [facility] [address] [address] [place], [place] [address]  Dear Lelon Mast,   I saw your patient, Yolanda Kelly, upon your kind request, in my sleep Clinic today for initial consultation of her daytime sleepiness, concern for narcolepsy.  The patient is unaccompanied today.  As you know, Yolanda Kelly is a 34 year old right-handed woman with an underlying medical history of Bell's palsy, anxiety, and mildly overweight state, who reports feeling tired and sleepy for years.  She reports that she has a longstanding history of fatigue.  Her daytime sleepiness has fluctuated.  She denies any sleep attacks such as falling asleep while driving or when active or upright.  She denies hypnagogic or hypnopompic hallucinations or sleep paralysis.  She reports having vivid dreams and nightmares, these are not new.  She has a history of snoring but no apneas are reported.  She has been taking baclofen at bedtime, she also has a prescription for gabapentin and alprazolam but does not take any of these recently. She lives with her family including husband and 3 children.  She has a bedtime of around 930 and falls asleep fairly quickly, she does have nighttime sleep disruption, not always with any apparent reason.  Sometimes she has cramping in her legs.  She denies any telltale symptoms of restless leg syndrome.  Rise time is around 6 AM.  She currently does not work.  She has no family history of narcolepsy, her father is being evaluated for obstructive sleep apnea as I understand.  She does take naps when she can.  She does not have any scheduled nap times.  She may sleep for an hour and sometimes for a few hours.  She does not have a tendency to dream in her naps.  Epworth sleepiness score is 14 out of 24 today, fatigue severity score is 44 out of 63.  She is a non-smoker and does not drink  caffeine and does not drink alcohol. She has seen previously seen Dr. Everlena Cooper in neurology for muscle twitching, paresthesias and recurrent headaches.  She has an appointment pending.  Her Past Medical History Is Significant For: Past Medical History:  Diagnosis Date  . Anxiety   . Bell's palsy   . OCD (obsessive compulsive disorder)     Her Past Surgical History Is Significant For: Past Surgical History:  Procedure Laterality Date  . BUNIONECTOMY Right     Her Family History Is Significant For: Family History  Problem Relation Kelly of Onset  . Thyroid disease Mother   . Kidney cancer Mother   . Asthma Father   . Irritable bowel syndrome Father   . Heart disease Sister   . Diabetes Mellitus II Maternal Grandmother   . Mental illness Maternal Grandfather   . Glaucoma Paternal Grandfather   . Celiac disease Sister   . Celiac disease Son        tests high for it, not 100% sure he has it  . Breast cancer Maternal Aunt   . Colon cancer Neg Hx   . Throat cancer Neg Hx   . Rectal cancer Neg Hx     Her Social History Is Significant For: Social History   Socioeconomic History  . Marital status: Married    Spouse name: Not on file  . Number of children: 3  . Years of education: college  .  Highest education level: Bachelor's degree (e.g., BA, AB, BS)  Occupational History  . 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 Smoking status: Never Smoker  . Smokeless tobacco: Never Used  Substance and Sexual Activity  . Alcohol use: No  . Drug use: No  . Sexual activity: Yes    Partners: Male  Other Topics Concern  . Not on file  Social History Narrative   Lives at home with husband and three children.   Right-handed.   No daily caffeine use.   Stay-at-home mother.   Fun: chase kiddos, reading, hiking   Social Determinants of Health   Financial Resource Strain:   . Difficulty of Paying Living Expenses:   Food Insecurity:   . Worried About Charity fundraiser in the Last Year:    . Arboriculturist in the Last Year:   Transportation Needs:   . Film/video editor (Medical):   Marland Kitchen Lack of Transportation (Non-Medical):   Physical Activity:   . Days of Exercise per Week:   . Minutes of Exercise per Session:   Stress:   . Feeling of Stress :   Social Connections:   . Frequency of Communication with Friends and Family:   . Frequency of Social Gatherings with Friends and Family:   . Attends Religious Services:   . Active Member of Clubs or Organizations:   . Attends Archivist Meetings:   Marland Kitchen Marital Status:     Her Allergies Are:  Allergies  Allergen Reactions  . Other Anaphylaxis    Coke and red mt. Dew, Throat closes up  :   Her Current Medications Are:  Outpatient Encounter Medications as of 12/18/2019  Medication Sig  . ALPRAZolam (XANAX) 0.25 MG tablet Take 1 tablet (0.25 mg total) by mouth 2 (two) times daily as needed for anxiety.  . baclofen (LIORESAL) 10 MG tablet Take 1-2 tablets (10-20 mg total) by mouth at bedtime as needed for muscle spasms.  Marland Kitchen gabapentin (NEURONTIN) 300 MG capsule Take 1 capsule (300 mg total) by mouth 3 (three) times daily as needed (for nerve pain).  . [DISCONTINUED] valACYclovir (VALTREX) 1000 MG tablet Take 1 tablet (1,000 mg total) by mouth 3 (three) times daily.   No facility-administered encounter medications on file as of 12/18/2019.  :   Review of Systems:  Out of a complete 14 point review of systems, all are reviewed and negative with the exception of these symptoms as listed below: Review of Systems  Neurological:       Room 1. Alone. She does not have any issues falling asleep but wakes up frequently during the night. She used to sleep walk when she was younger but this has not been an problem for years. Says when she sits down, she tends to fall asleep quickly and will sometimes not wake up for hours. Also, she will collapse if she is startled.//mck  Epworth Sleepiness Scale 0= would never doze 1=  slight chance of dozing 2= moderate chance of dozing 3= high chance of dozing  Sitting and reading:3 Watching TV:2 Sitting inactive in a public place (ex. Theater or meeting):1 As a passenger in a car for an hour without a break:3 Lying down to rest in the afternoon:3 Sitting and talking to someone:0 Sitting quietly after lunch (no alcohol):2 In a car, while stopped in traffic:0 Total:14    Objective:  Neurological Exam  Physical Exam Physical Examination:   Vitals:   12/18/19 1054  BP: 115/76  Pulse:  71  Temp: 98.3 F (36.8 C)    General Examination: The patient is a very pleasant 34 y.o. female in no acute distress. She appears well-developed and well-nourished and well groomed.   HEENT: Normocephalic, atraumatic, pupils are equal, round and reactive to light, extraocular tracking is good without limitation to gaze excursion or nystagmus noted. Hearing is grossly intact. Face is symmetric with normal facial animation. Speech is clear with no dysarthria noted. There is no hypophonia. There is no lip, neck/head, jaw or voice tremor. Neck is supple with full range of passive and active motion. There are no carotid bruits on auscultation. Oropharynx exam reveals: moderate mouth dryness, good dental hygiene and mild airway crowding, due to smaller airway entry. Tongue protrudes centrally and palate elevates symmetrically.  Chest: Clear to auscultation without wheezing, rhonchi or crackles noted.  Heart: S1+S2+0, regular and normal without murmurs, rubs or gallops noted.   Abdomen: Soft, non-tender and non-distended with normal bowel sounds appreciated on auscultation.  Extremities: There is no obv. edema in the distal lower extremities bilaterally.   Skin: Warm and dry without trophic changes noted.   Musculoskeletal: exam reveals no obvious joint deformities, tenderness or joint swelling or erythema.   Neurologically:  Mental status: The patient is awake, alert and  oriented in all 4 spheres. Her immediate and remote memory, attention, language skills and fund of knowledge are appropriate. There is no evidence of aphasia, agnosia, apraxia or anomia. Speech is clear with normal prosody and enunciation. Thought process is linear. Mood is normal and affect is normal.  Cranial nerves II - XII are as described above under HEENT exam.  Motor exam: Normal bulk, strength and tone is noted. There is no tremor, Romberg is negative. Fine motor skills and coordination: grossly intact.  Cerebellar testing: No dysmetria or intention tremor. There is no truncal or gait ataxia.  Sensory exam: intact to light touch in the upper and lower extremities.  Gait, station and balance: She stands easily. No veering to one side is noted. No leaning to one side is noted. Posture is Kelly-appropriate and stance is narrow based. Gait shows normal stride length and normal pace. No problems turning are noted. Tandem walk is unremarkable.         Assessment and plan:   In summary, Yolanda Kelly is a very pleasant 34 y.o.-year old female with an underlying medical history of Bell's palsy, anxiety, and mildly overweight state, who presents for evaluation of her daytime somnolence. We will proceed with extended sleep study testing in the form of an overnight polysomnogram and next day nap study, called MSLT.  In preparation of her sleep studies she is advised to stay off her baclofen, gabapentin and alprazolam; she would have to be off these meds for at least 2 weeks prior to testing. We talked about typical causes for daytime sleepiness including underlying sleep disordered breathing such as OSA, medication effect from sedating medication, underlying sleepiness d/o, such as narcolepsy or idiopathic hypersomnolence.  We also briefly talked about treatment options such as CPAP or AutoPap therapy for obstructive sleep apnea and symptomatic medication for underlying sleepiness disorders to help with daytime  somnolence.  We will proceed with testing and take it from there, the sleep lab will call her to schedule her sleep studies.  I answered all her questions today and she was advised not to drive when feeling sleepy.   Thank you very much for allowing me to participate in the care of this nice patient.  If I can be of any further assistance to you please do not hesitate to call me at [phone].  Sincerely,   Yolanda Age, MD, PhD

## 2019-12-18 NOTE — Patient Instructions (Addendum)
  Here is what we discussed today and what we came up with as our plan for you:   Based on your sleep related symptoms, we should look into your severe sleepiness with a nighttime lab-attended sleep study, followed by a daytime nap study. I would like for you to come back for an overnight sleep study during which we will monitor your night time sleep and we will do nap study testing the next day: 5 scheduled 20 min nap opportunities, every 2 hours. We will remind you to stay awake in between naps.    In preparation for sleep study testing, as discussed, you would have to taper off certain meds, including alprazolam, gabapentin and baclofen, as these medications can be sedating.   You would have to be off these meds for at least 2 weeks prior to sleep study testing. Please, do no drive when sleepy! Pull over to rest if you feel sleepy while driving. Please talk to your prescribing doctor about coming off and staying off the above meds for sleep testing.  We will go ahead and seek insurance authorization for testing.  Please keep your sleep schedule stable and do not add any additional medications or caffeine in your day to day routine, in preparation of the studies.  Reasons, why you are sleepy during the day, may include:  Underlying sleep apnea, medication side effects causing drowsiness during the day, sleep deprivation, underlying narcolepsy or another hypersomnolence disorder, meaning a condition that makes you abnormally sleepy and is not explained by narcolepsy or another condition or by medication side effects.  Our sleep lab administrative assistant will call you to schedule your sleep study. If you don't hear back from her by about 2 weeks from now, please feel free to call her at [phone]. You can leave a message with your phone number and concerns, if you get the voicemail box. She will call back as soon as possible.

## 2019-12-20 ENCOUNTER — Encounter: Payer: Self-pay | Admitting: Family Medicine

## 2019-12-24 ENCOUNTER — Ambulatory Visit: Payer: BC Managed Care – PPO

## 2019-12-24 DIAGNOSIS — D225 Melanocytic nevi of trunk: Secondary | ICD-10-CM | POA: Diagnosis not present

## 2019-12-24 DIAGNOSIS — L718 Other rosacea: Secondary | ICD-10-CM | POA: Diagnosis not present

## 2019-12-25 ENCOUNTER — Other Ambulatory Visit: Payer: Self-pay | Admitting: Family Medicine

## 2019-12-26 ENCOUNTER — Telehealth: Payer: Self-pay

## 2019-12-26 ENCOUNTER — Telehealth: Payer: Self-pay | Admitting: Physician Assistant

## 2019-12-26 ENCOUNTER — Ambulatory Visit: Payer: BC Managed Care – PPO | Attending: Internal Medicine

## 2019-12-26 DIAGNOSIS — R200 Anesthesia of skin: Secondary | ICD-10-CM

## 2019-12-26 DIAGNOSIS — M6281 Muscle weakness (generalized): Secondary | ICD-10-CM

## 2019-12-26 DIAGNOSIS — Z23 Encounter for immunization: Secondary | ICD-10-CM

## 2019-12-26 NOTE — Progress Notes (Signed)
   Covid-19 Vaccination Clinic  Name:  Yolanda Kelly    MRN: [id number] DOB: [birth year]  12/26/2019  [name] was observed post Covid-19 immunization for 30 minutes based on pre-vaccination screening without incident. She was provided with Vaccine Information Sheet and instruction to access the V-Safe system.   [name] was instructed to call 911 with any severe reactions post vaccine: Marland Kitchen Difficulty breathing  . Swelling of face and throat  . A fast heartbeat  . A bad rash all over body  . Dizziness and weakness   Immunizations Administered    Name Date Dose VIS Date Route   Pfizer COVID-19 Vaccine 12/26/2019  8:52 AM 0.3 mL 08/23/2019 Intramuscular   Manufacturer: [employer], [employer]   Lot: [id number]   NDC: [address]-6

## 2019-12-26 NOTE — Telephone Encounter (Signed)
Spoke to pt told her I placed the referral for Neurology and someone will contact you about an appt. Pt verbalized understanding.

## 2019-12-26 NOTE — Telephone Encounter (Signed)
Patient have some additional questions regarding neurologist referral.

## 2019-12-26 NOTE — Telephone Encounter (Signed)
Pt called requesting a referral to [facility].  She said Dr Everlena Cooper was very nice - she just wants a second opinion.  I told her that Doreatha Martin was out of the office today and that the referral might have to be placed tomorrow - she voiced understanding.

## 2019-12-31 ENCOUNTER — Ambulatory Visit: Payer: BC Managed Care – PPO | Admitting: Family Medicine

## 2020-01-01 ENCOUNTER — Telehealth: Payer: Self-pay | Admitting: Neurology

## 2020-01-01 NOTE — Telephone Encounter (Signed)
It looks like she seeks a second opinion for numbness and weakness. This would warrant an neuromuscular opinion.  I am really not the best person to provide the second opinion.  I have reviewed Dr. Moises Blood work-up and I would only repeat her EMG nerve conduction test (which I don't do), and some blood work which she already had. I am afraid the patient will not be well served with seeing me for this, as neuromuscular diseases are not my specialty. I would recommend that she seek a second opinion with a neuromuscular specialist, she can talk to her primary care about a referral to a specialist at one of the academic centers.

## 2020-01-01 NOTE — Telephone Encounter (Signed)
We've received an internal referral on patient for muscle weakness and numbness of legs. She has seen Dr. Everlena Cooper for this in the past, but would like to get a second opinion. I know she's seen you for sleep recently and I wanted to make sure you'd be ok to see her for this. Could you please review?

## 2020-01-02 NOTE — Telephone Encounter (Signed)
Noted. I will left the referring office know your suggestion.

## 2020-01-20 ENCOUNTER — Ambulatory Visit (INDEPENDENT_AMBULATORY_CARE_PROVIDER_SITE_OTHER): Payer: BC Managed Care – PPO | Admitting: Neurology

## 2020-01-20 DIAGNOSIS — R6889 Other general symptoms and signs: Secondary | ICD-10-CM

## 2020-01-20 DIAGNOSIS — G4710 Hypersomnia, unspecified: Secondary | ICD-10-CM

## 2020-01-20 DIAGNOSIS — G4719 Other hypersomnia: Secondary | ICD-10-CM

## 2020-01-20 DIAGNOSIS — G4720 Circadian rhythm sleep disorder, unspecified type: Secondary | ICD-10-CM

## 2020-01-21 ENCOUNTER — Ambulatory Visit (INDEPENDENT_AMBULATORY_CARE_PROVIDER_SITE_OTHER): Payer: BC Managed Care – PPO | Admitting: Neurology

## 2020-01-21 ENCOUNTER — Other Ambulatory Visit: Payer: Self-pay

## 2020-01-21 ENCOUNTER — Ambulatory Visit: Payer: BC Managed Care – PPO

## 2020-01-21 ENCOUNTER — Other Ambulatory Visit: Payer: Self-pay | Admitting: Neurology

## 2020-01-21 DIAGNOSIS — Z0389 Encounter for observation for other suspected diseases and conditions ruled out: Secondary | ICD-10-CM | POA: Diagnosis not present

## 2020-01-21 DIAGNOSIS — G4719 Other hypersomnia: Secondary | ICD-10-CM

## 2020-01-21 DIAGNOSIS — Z79899 Other long term (current) drug therapy: Secondary | ICD-10-CM

## 2020-01-21 DIAGNOSIS — G4752 REM sleep behavior disorder: Secondary | ICD-10-CM | POA: Diagnosis not present

## 2020-01-21 DIAGNOSIS — Z5181 Encounter for therapeutic drug level monitoring: Secondary | ICD-10-CM | POA: Diagnosis not present

## 2020-01-21 DIAGNOSIS — R6889 Other general symptoms and signs: Secondary | ICD-10-CM

## 2020-01-21 DIAGNOSIS — Z0001 Encounter for general adult medical examination with abnormal findings: Secondary | ICD-10-CM | POA: Diagnosis not present

## 2020-01-21 NOTE — Addendum Note (Signed)
Addended by: Tamera Stands D on: 01/21/2020 03:25 PM   Modules accepted: Orders

## 2020-01-22 ENCOUNTER — Ambulatory Visit: Payer: BC Managed Care – PPO | Attending: Internal Medicine

## 2020-01-22 DIAGNOSIS — Z23 Encounter for immunization: Secondary | ICD-10-CM

## 2020-01-22 NOTE — Progress Notes (Signed)
   Covid-19 Vaccination Clinic  Name:  Yolanda Kelly    MRN: [id number] DOB: May 09, 1986  01/22/2020  [name] was observed post Covid-19 immunization for 15 minutes without incident. She was provided with Vaccine Information Sheet and instruction to access the V-Safe system.   [name] was instructed to call 911 with any severe reactions post vaccine: Marland Kitchen Difficulty breathing  . Swelling of face and throat  . A fast heartbeat  . A bad rash all over body  . Dizziness and weakness   Immunizations Administered    Name Date Dose VIS Date Route   Pfizer COVID-19 Vaccine 01/22/2020 10:01 AM 0.3 mL 11/06/2018 Intramuscular   Manufacturer: [employer], [employer]   Lot: [id number]   NDC: [address]-8

## 2020-01-24 LAB — COMPREHENSIVE DRUG ANALYSIS,UR

## 2020-01-25 NOTE — Progress Notes (Signed)
Clean medication test for MSLT patient.

## 2020-01-28 ENCOUNTER — Telehealth: Payer: Self-pay | Admitting: Psychiatry

## 2020-01-28 ENCOUNTER — Ambulatory Visit (INDEPENDENT_AMBULATORY_CARE_PROVIDER_SITE_OTHER): Payer: BC Managed Care – PPO | Admitting: Psychiatry

## 2020-01-28 DIAGNOSIS — F422 Mixed obsessional thoughts and acts: Secondary | ICD-10-CM | POA: Diagnosis not present

## 2020-01-28 NOTE — Progress Notes (Signed)
[facility] Counselor/Therapist Progress Note  Patient ID: Yolanda Kelly, MRN: [id number],    Date: 01/28/2020   Time Spent: 52 minutes start time 8:00 AM and time 8:52 AM Virtual Visit via Telephone Note Connected with patient by a video enabled telemedicine/telehealth application, with their informed consent, and verified patient privacy and that I am speaking with the correct person using two identifiers. I discussed the limitations, risks, security and privacy concerns of performing psychotherapy and management service by telephone and the availability of in person appointments. I also discussed with the patient that there may be a patient responsible charge related to this service. The patient expressed understanding and agreed to proceed. I discussed the treatment planning with the patient. The patient was provided an opportunity to ask questions and all were answered. The patient agreed with the plan and demonstrated an understanding of the instructions. The patient was advised to call  our office if  symptoms worsen or feel they are in a crisis state and need immediate contact.   Therapist Location: office Patient Location: home    Treatment Type: Individual Therapy  Reported Symptoms: anxiety, obsessive thinking, fatigue  Mental Status Exam:  Appearance:   Casual     Behavior:  Appropriate  Motor:  Normal  Speech/Language:   Normal Rate  Affect:  Appropriate  Mood:  anxious  Thought process:  normal  Thought content:    WNL  Sensory/Perceptual disturbances:    WNL  Orientation:  oriented to person, place, time/date and situation  Attention:  Good  Concentration:  Good  Memory:  WNL  Fund of knowledge:   Good  Insight:    Good  Judgment:   Good  Impulse Control:  Good   Risk Assessment: Danger to Self:  No Self-injurious Behavior: No Danger to Others: No Duty to Warn:no Physical Aggression / Violence:No  Access to Firearms a concern: No  Gang Involvement:No    Subjective: met with patient via virtual session.  She shared that she is still going through lost of testing still.  She shared she is having a hard time feeling like a burden.  She explained she is not able to do the things to help her husband or her children the way she would like to.  Patient shared that it is been very difficult for her and she is concerned that she will continue to get worse and be difficult for her husband to deal with.  She explained that typically he is not a caretaker type.  She explained that she grew up with a mother who was ill and she was the caretaker so it is very natural for her to fall into this role.  She also remembers what it was like to be a child of a mother who was physically sick and that is concerning to her for her children.  Patient was encouraged to recognize the fact that even though she may not be able to do the things physically that she wants to do for her children or her husband she is still there to meet their emotional needs.  Patient acknowledged that her emotional needs were not met from her family.  She shared that she has been surprised at different moments where her mother finally and knowledge there had been some impact on her due to her physical condition.  Patient was encouraged to recognize all that she does for her children through processing and helping them be able to express themselves and learn coping skills  and problem solving skills through their conversations with her.  Patient was able to identify different moments where she has been able to discuss things with her children and that has felt very empowering to her.  She was encouraged to write down the things that she can do even when she is not physically who she wants to be and to work on communicating those things with her children and her husband so that she can remember her purpose.  Patient agreed to work on her self talk and trying to remind herself that she has a purpose and she is  making an impact with her children even when she cannot be who she wants to be.  Interventions: Cognitive Behavioral Therapy and Solution-Oriented/Positive Psychology  Diagnosis:   ICD-10-CM   1. Mixed obsessional thoughts and acts  F42.2     Plan: Patient is to use CBT and coping skills to decrease anxiety and obsessive thinking.  Patient is to follow plans to write down all that she can do even when she is not physically able to do other things, she is to read the list when she is feeling frustrated with her body and she is also to communicate with her children and husband about her feelings and morning to make sure each of their needs are met as much as she can.  Patient is to continue working with her physicians to find out what is going on with her body. Long-term goal: Enhance ability to handle effectively the full variety of life's anxieties Short-term goal: Identify an anxiety coping mechanism that has been successful in past and increase its use  Lina Sayre, [facility]

## 2020-01-28 NOTE — Telephone Encounter (Signed)
Ms. saryna, kneeland are scheduled for a virtual visit with your provider today.    Just as we do with appointments in the office, we must obtain your consent to participate.  Your consent will be active for this visit and any virtual visit you may have with one of our providers in the next 365 days.    If you have a MyChart account, I can also send a copy of this consent to you electronically.  All virtual visits are billed to your insurance company just like a traditional visit in the office.  As this is a virtual visit, video technology does not allow for your provider to perform a traditional examination.  This may limit your provider's ability to fully assess your condition.  If your provider identifies any concerns that need to be evaluated in person or the need to arrange testing such as labs, EKG, etc, we will make arrangements to do so.    Although advances in technology are sophisticated, we cannot ensure that it will always work on either your end or our end.  If the connection with a video visit is poor, we may have to switch to a telephone visit.  With either a video or telephone visit, we are not always able to ensure that we have a secure connection.   I need to obtain your verbal consent now.   Are you willing to proceed with your visit today?   Jadee Golebiewski has provided verbal consent on 01/28/2020 for a virtual visit (video or telephone).   Stevphen Meuse, [facility] 01/28/2020  8:02 AM

## 2020-01-29 ENCOUNTER — Ambulatory Visit: Payer: BC Managed Care – PPO

## 2020-02-03 ENCOUNTER — Telehealth: Payer: Self-pay

## 2020-02-03 NOTE — Procedures (Signed)
Name:  Yolanda Kelly, Yolanda Kelly Reference [id number]  Study Date: 01/21/2020 Procedure #: [id number]  DOB: 1986-06-02    34 year old woman with a history of Bell's palsy, anxiety, and mildly overweight state, who reports feeling tired and sleepy for years. The patient endorsed the Epworth Sleepiness Scale at 14 points. The patient's weight 150 pounds with a height of 62 (inches), resulting in a BMI of 27.6 kg/m2. The patient's neck circumference measured 12.5 inches. A UDS on 01/21/20 was negative for any drugs/medications.  Protocol  This is a 13 channel Multiple Sleep Latency Test comprised of 5 channels of EEG (T3-Cz, Cz-T4, F4-M1, C4-M1, O2-M1), 3 channels of Chin EMG, 4 channels of EOG and 1 channel for ECG.   All channels were sampled at 256hz .    This polysomnographic procedure is designed to evaluate (1) the complaint of excessive daytime sleepiness by quantifying the time required to fall asleep and (2) the possibility of narcolepsy by checking for abnormally short latencies to REM sleep.  Electrographic variables include EEG, EMG, EOG and ECG.  Patients are monitored throughout four or five 20-minute opportunities to sleep (naps) at two-hour intervals.  For each nap, the patient is allowed 20 minutes to fall asleep.  Once asleep, the patient is awakened after 15 minutes.  Between naps, the patient is kept as alert as possible.  A sleep latency of 20 minutes indicates that no sleep occurred.  Parametric Analysis  Total Number of Naps 4     NAP # Time of Nap  Sleep Latency (mins) REM Latency (mins) Sleep Time Percent Awake Time Percent  1 07:02 20 0 0 100   2 09:01 20 0 0  100   3 11:03 20 0 0  100   4 13:01 16.5 0 32  68    MSLT Summary of Naps  Sleepiness Index: 4.4  Mean Sleep Latency to First Four Naps: 19.1  Mean Sleep Latency to First Three Naps: 20  Mean Sleep Latency to First Two Naps: 20  Number of Naps with REM Sleep: 0    Results from Preceding PSG Study  Sleep Onset Time  22:19 Sleep Efficiency (%) 93.0 %  Rise Time 05:31 Sleep Latency (min) 8 min  Total Sleep Time  7.4 H REM Latency (min) 71.5 min           I attest to having reviewed every epoch of the entire raw data recording prior to the issuance of this report in accordance with the Standards of the American Academy of Sleep Medicine.    Interpretation: This multiple sleep latency test reveals a mean sleep latency of 19.1 minutes with 0 sleep periods during which REM sleep was recorded.  A total of 4 sleep periods were recorded.   1. This study was preceded by an overnight polysomnogram with a total sleep time (TST) of 410 minutes.       IMPRESSION:  1. Dysfunctions associated with sleep stages or arousal from sleep 2. Non-specific, mild daytime somnolence  RECOMMENDATIONS:  1. The overnight sleep study does not demonstrate any significant obstructive or central sleep disordered breathing. 2. The nocturnal PSG and next day MSLT indicate mild and non-specific daytime somnolence, with sleep achieved only in one out of 4 naps and no REM sleep during the nap. These studies do not support the diagnosis of idiopathic hypersomnolence or narcolepsy.  3. The nocturnal polysomnogram shows some sleep fragmentation and mildly abnormal sleep stage percentages; these are nonspecific findings and per se do not signify an  intrinsic sleep disorder or a cause for the patient's sleep-related symptoms. Causes include (but are not limited to) the first night effect of the sleep study, circadian rhythm disturbances, medication effect or an underlying mood disorder or medical problem.  4. The patient should be cautioned not to drive, work at heights, or operate dangerous or heavy equipment when tired or sleepy. Review and reiteration of good sleep hygiene measures should be pursued with any patient. 5. The patient and her referring provider will be notified of the test results; the patient will be seen in follow-up by Dr.  Rexene Alberts at [facility] if needed.  I certify that I have reviewed the entire raw data recording prior to the issuance of this report in accordance with the Standards of Accreditation of the American Academy of Sleep Medicine (AASM)  Star Age, MD, PhD Diplomat, American Board of Neurology and Sleep Medicine (Neurology and Sleep Medicine)

## 2020-02-03 NOTE — Procedures (Signed)
PATIENT'S NAME:  Yolanda, Kelly DOB:      1985-11-22      MR#:    [id number]     DATE OF RECORDING: 01/20/2020 REFERRING M.D.:  [name], [name] Study Performed:   Baseline Polysomnogram HISTORY: 34 year old woman with a history of Bell's palsy, anxiety, and mildly overweight state, who reports feeling tired and sleepy for years. The patient endorsed the Epworth Sleepiness Scale at 14 points. The patient's weight 150 pounds with a height of 62 (inches), resulting in a BMI of 27.6 kg/m2. The patient's neck circumference measured 12.5 inches. A UDS on 01/21/20 was negative for any drugs/medications.  CURRENT MEDICATIONS: Xanax, Lioresal, Neurontin   PROCEDURE:  This is a multichannel digital polysomnogram utilizing the Somnostar 11.2 system.  Electrodes and sensors were applied and monitored per AASM Specifications.   EEG, EOG, Chin and Limb EMG, were sampled at 200 Hz.  ECG, Snore and Nasal Pressure, Thermal Airflow, Respiratory Effort, CPAP Flow and Pressure, Oximetry was sampled at 50 Hz. Digital video and audio were recorded.      BASELINE STUDY  Lights Out was at 22:11 and Lights On at 05:31.  Total recording time (TRT) was 441 minutes, with a total sleep time (TST) of 410 minutes.   The patient's sleep latency was 9.5 minutes.  REM latency was 71.5 minutes, which is normal.  The sleep efficiency was 93. %.     SLEEP ARCHITECTURE: WASO (Wake after sleep onset) was 23 minutes with minimal to mild sleep fragmentation noted. There were 22 minutes in Stage N1, 139.5 minutes Stage N2, 153.5 minutes Stage N3 and 95 minutes in Stage REM.  The percentage of Stage N1 was 5.4%, Stage N2 was 34.%, which is reduced, Stage N3 was 37.4%, which is mildly increased, and Stage R (REM sleep) was 23.2%, which is normal. The arousals were noted as: 42 were spontaneous, 0 were associated with PLMs, 0 were associated with respiratory events.  RESPIRATORY ANALYSIS:  There were a total of 0 respiratory events:  0  obstructive apneas, 0 central apneas and 0 mixed apneas with a total of 0 apneas and an apnea index (AI) of 0 /hour. There were 0 hypopneas with a hypopnea index of 0 /hour. The patient also had 0 respiratory event related arousals (RERAs).      The total APNEA/HYPOPNEA INDEX (AHI) was 0/hour and the total RESPIRATORY DISTURBANCE INDEX was  0 /hour.  0 events occurred in REM sleep and 0 events in NREM. The REM AHI was  0 /hour, versus a non-REM AHI of 0. The patient spent 172.5 minutes of total sleep time in the supine position and 238 minutes in non-supine.. The supine AHI was 0.0 versus a non-supine AHI of 0.0.  OXYGEN SATURATION & C02:  The Wake baseline 02 saturation was 96%, with the lowest being 91%. Time spent below 89% saturation equaled 0 minutes.  PERIODIC LIMB MOVEMENTS: The patient had a total of 0 Periodic Limb Movements.  The Periodic Limb Movement (PLM) index was 0 and the PLM Arousal index was 0/hour.  Audio and video analysis did not show any abnormal or unusual movements, behaviors, phonations or vocalizations. The patient took no bathroom breaks. No significant snoring was noted. The EKG was in keeping with normal sinus rhythm (NSR).  Post-study, the patient indicated that sleep was worse than usual.   The patient had a nap study, MSLT, next day on 01/21/20 with a mean sleep latency of 19.1 minutes for 4 naps and no SOREMPs (  sleep onset REM periods) noted.  IMPRESSION:  1. Dysfunctions associated with sleep stages or arousal from sleep 2. Non-specific, mild daytime somnolence  RECOMMENDATIONS:  1. The overnight sleep study does not demonstrate any significant obstructive or central sleep disordered breathing. 2. The nocturnal PSG and next day MSLT indicate mild and non-specific daytime somnolence, with sleep achieved only in one out of 4 naps and no REM sleep during the nap. These studies do not support the diagnosis of idiopathic hypersomnolence or narcolepsy.  3. The  nocturnal polysomnogram shows some sleep fragmentation and mildly abnormal sleep stage percentages; these are nonspecific findings and per se do not signify an intrinsic sleep disorder or a cause for the patient's sleep-related symptoms. Causes include (but are not limited to) the first night effect of the sleep study, circadian rhythm disturbances, medication effect or an underlying mood disorder or medical problem.  4. The patient should be cautioned not to drive, work at heights, or operate dangerous or heavy equipment when tired or sleepy. Review and reiteration of good sleep hygiene measures should be pursued with any patient. 5. The patient and her referring provider will be notified of the test results; the patient will be seen in follow-up by Dr. Rexene Alberts at [facility] if needed.  I certify that I have reviewed the entire raw data recording prior to the issuance of this report in accordance with the Standards of Accreditation of the American Academy of Sleep Medicine (AASM)  Star Age, MD, PhD Diplomat, American Board of Neurology and Sleep Medicine (Neurology and Sleep Medicine)

## 2020-02-03 NOTE — Telephone Encounter (Signed)
I called pt. I advised pt that Dr. Frances Furbish reviewed pt's sleep study and found that recent sleep studies do not support a specific sleepiness diagnosis. Dr. Frances Furbish recommends that pt f/u with PCP at this point. Pt was agreeable and and no questions/concerns.

## 2020-02-03 NOTE — Progress Notes (Signed)
Patient referred by [name], PA, seen by me on 12/18/19, PSG on 01/20/20, MSLT on 01/21/20:  Please call and inform patient that the recent sleep studies do not support a specific sleepiness diagnosis, such as narcolepsy or idiopathic hypersomnolence. Nighttime sleep study was benign and the daytime sleep study showed rather mild sleepiness with sleep achieved in 1 out of 4 naps, and no dream sleep during the nap. At this juncture, I recommend, she FU with her PCP, to discuss any other treatable causes of fatigue and sleepiness, such as vit D and B12 deficiency, thyroid disease, anemia, etc. She did have some blood work since 07/20, as I can see. She can FU with me as needed.

## 2020-02-03 NOTE — Telephone Encounter (Signed)
-----   Message from Huston Foley, MD sent at 02/03/2020  7:55 AM EDT ----- Patient referred by [name], PA, seen by me on 12/18/19, PSG on 01/20/20, MSLT on 01/21/20:  Please call and inform patient that the recent sleep studies do not support a specific sleepiness diagnosis, such as narcolepsy or idiopathic hypersomnolence. Nighttime sleep study was benign and the daytime sleep study showed rather mild sleepiness with sleep achieved in 1 out of 4 naps, and no dream sleep during the nap. At this juncture, I recommend, she FU with her PCP, to discuss any other treatable causes of fatigue and sleepiness, such as vit D and B12 deficiency, thyroid disease, anemia, etc. She did have some blood work since 07/20, as I can see. She can FU with me as needed.

## 2020-02-10 ENCOUNTER — Other Ambulatory Visit: Payer: Self-pay | Admitting: Family Medicine

## 2020-02-18 ENCOUNTER — Telehealth (INDEPENDENT_AMBULATORY_CARE_PROVIDER_SITE_OTHER): Payer: BC Managed Care – PPO | Admitting: Physician Assistant

## 2020-02-18 ENCOUNTER — Encounter: Payer: Self-pay | Admitting: Physician Assistant

## 2020-02-18 ENCOUNTER — Telehealth: Payer: Self-pay | Admitting: Physician Assistant

## 2020-02-18 DIAGNOSIS — G4700 Insomnia, unspecified: Secondary | ICD-10-CM

## 2020-02-18 DIAGNOSIS — F411 Generalized anxiety disorder: Secondary | ICD-10-CM | POA: Diagnosis not present

## 2020-02-18 DIAGNOSIS — F422 Mixed obsessional thoughts and acts: Secondary | ICD-10-CM

## 2020-02-18 MED ORDER — ZALEPLON 10 MG PO CAPS: ORAL_CAPSULE | ORAL | 0 refills | Status: DC

## 2020-02-18 MED ORDER — ALPRAZOLAM 0.25 MG PO TABS: 0.2500 mg | ORAL_TABLET | Freq: Two times a day (BID) | ORAL | 1 refills | Status: DC | PRN

## 2020-02-18 NOTE — Telephone Encounter (Signed)
[name], Yolanda Kelly are scheduled for a virtual visit with your provider today.    Just as we do with appointments in the office, we must obtain your consent to participate.  Your consent will be active for this visit and any virtual visit you may have with one of our providers in the next 365 days.    If you have a MyChart account, I can also send a copy of this consent to you electronically.  All virtual visits are billed to your insurance company just like a traditional visit in the office.  As this is a virtual visit, video technology does not allow for your provider to perform a traditional examination.  This may limit your provider's ability to fully assess your condition.  If your provider identifies any concerns that need to be evaluated in person or the need to arrange testing such as labs, EKG, etc, we will make arrangements to do so.    Although advances in technology are sophisticated, we cannot ensure that it will always work on either your end or our end.  If the connection with a video visit is poor, we may have to switch to a telephone visit.  With either a video or telephone visit, we are not always able to ensure that we have a secure connection.   I need to obtain your verbal consent now.   Are you willing to proceed with your visit today?   Yolanda Kelly has provided verbal consent on 02/18/2020 for a virtual visit (video or telephone).   [name], PA-C 02/18/2020  12:20 PM

## 2020-02-18 NOTE — Progress Notes (Signed)
[facility]  Patient ID: Yolanda Kelly,  MRN: [id number]  PCP: [name], PA  Date of Evaluation: 02/18/2020 Time spent:20 minutes  Chief Complaint:  Chief Complaint    Follow-up     Virtual Visit via Telephone or Video Note  I connected with patient by a video enabled telemedicine application or telephone, with their informed consent, and verified patient privacy and that I am speaking with the correct person using two identifiers.  I am private, in my office and the patient is home work.  I discussed the limitations, risks, security and privacy concerns of performing an evaluation and management service by video and the availability of in person appointments. I also discussed with the patient that there may be a patient responsible charge related to this service. The patient expressed understanding and agreed to proceed.   I discussed the assessment and treatment plan with the patient. The patient was provided an opportunity to ask questions and all were answered. The patient agreed with the plan and demonstrated an understanding of the instructions.   The patient was advised to call back or seek an in-person evaluation if the symptoms worsen or if the condition fails to improve as anticipated.  I provided 20 minutes of non-face-to-face time during this encounter.   HISTORY/CURRENT STATUS: HPI  For routine med check.  The main problem she is having right now has not been able to sleep.  She has tried melatonin which is kind of hit or miss.  Then she has tried Unisom a couple of times and it does help her go to sleep and stay asleep but the next morning she is really groggy for about half a day.  She has taken Sonata in the past and wonders if that would be a good treatment right now for a short while as needed.  She does not drink any caffeine.  And she is off electronics at least 30 minutes to an hour prior to bedtime.  Not needing the Xanax often.  It does still  help when she needs it.  Still has OCD tendencies but is hesitant to take Xanax or definitely an SSRI to prevent the symptoms.  For the most part, the OCD is controlled enough that she is able to function normally and do the necessary things in her life.  She is able to enjoy things.  She sleeps well.  Energy and motivation are good.  Not crying easily.  Denies forgetfulness.  No suicidal or homicidal thoughts.  Denies dizziness, syncope, seizures, numbness, tingling, tremor, tics, unsteady gait, slurred speech, confusion. Denies muscle or joint pain, stiffness, or dystonia.  Individual Medical History/ Review of Systems: Changes? :No    Past medications for mental health diagnoses include: Xanax, Unisom, Sonata, buspar caused h/a and nausea  Allergies: Other  Current Medications:  Current Outpatient Medications:  .  ALPRAZolam (XANAX) 0.25 MG tablet, Take 1 tablet (0.25 mg total) by mouth 2 (two) times daily as needed for anxiety., Disp: 30 tablet, Rfl: 1 .  Melatonin 3-10 MG TABS, Take by mouth., Disp: , Rfl:  .  baclofen (LIORESAL) 10 MG tablet, Take 1-2 tablets (10-20 mg total) by mouth at bedtime as needed for muscle spasms. (Patient not taking: Reported on 02/18/2020), Disp: 30 each, Rfl: 1 .  gabapentin (NEURONTIN) 300 MG capsule, Take 1 capsule (300 mg total) by mouth 3 (three) times daily as needed (for nerve pain). (Patient not taking: Reported on 02/18/2020), Disp: 90 capsule, Rfl: 1 .  zaleplon (  SONATA) 10 MG capsule, 1 po qhs prn, and may repeat 1 for midnocturnal awakening, as long as she has 3 hours left to sleep., Disp: 60 capsule, Rfl: 0 Medication Side Effects: none  Family Medical/ Social History: Changes?  No  MENTAL HEALTH EXAM:  There were no vitals taken for this visit.There is no height or weight on file to calculate BMI.  General Appearance: Casual, Neat and Well Groomed  Eye Contact:  Good  Speech:  Clear and Coherent  Volume:  Normal  Mood:  Euthymic  Affect:   Appropriate  Thought Process:  Goal Directed and Descriptions of Associations: Intact  Orientation:  Full (Time, Place, and Person)  Thought Content: Logical   Suicidal Thoughts:  No  Homicidal Thoughts:  No  Memory:  WNL  Judgement:  Good  Insight:  Good  Psychomotor Activity:  Is normal for in the video  Concentration:  Concentration: Good  Recall:  Good  Fund of Knowledge: Good  Language: Good  Assets:  Desire for Improvement  ADL's:  Intact  Cognition: WNL  Prognosis:  Good    DIAGNOSES:    ICD-10-CM   1. Insomnia, unspecified type  G47.00   2. Generalized anxiety disorder  F41.1   3. Mixed obsessional thoughts and acts  F42.2     Receiving Psychotherapy: Yes With Lina Sayre, [facility]   RECOMMENDATIONS:  PDMP reviewed. I spent 20 minutes with her. Discussed sleep hygiene.  She is already doing pretty much all she can do.  Try to get off of electronic devices 2 hours prior to the time she needs to go to sleep.  That may be more helpful. Restart Sonata 10 mg, 1 p.o. nightly as needed and 1 p.o. for mid nocturnal awakening as needed, as long as she has 3 hours left to sleep. Continue Xanax 0.25 mg 1 twice daily as needed.  Because she is needing the benzo so infrequently, and the fact that she does not want to take any other medicine to prevent the anxiety, I am not prescribing an SSRI for prevention of OCD and anxiety.   Continue therapy with Lina Sayre, [facility]. Return in 4 weeks.  Donnal Moat, PA-C

## 2020-02-19 ENCOUNTER — Ambulatory Visit: Payer: BC Managed Care – PPO

## 2020-03-23 ENCOUNTER — Encounter: Payer: Self-pay | Admitting: Physician Assistant

## 2020-03-23 ENCOUNTER — Telehealth (INDEPENDENT_AMBULATORY_CARE_PROVIDER_SITE_OTHER): Payer: BC Managed Care – PPO | Admitting: Physician Assistant

## 2020-03-23 DIAGNOSIS — F411 Generalized anxiety disorder: Secondary | ICD-10-CM

## 2020-03-23 DIAGNOSIS — F422 Mixed obsessional thoughts and acts: Secondary | ICD-10-CM

## 2020-03-23 DIAGNOSIS — G4700 Insomnia, unspecified: Secondary | ICD-10-CM | POA: Diagnosis not present

## 2020-03-23 NOTE — Progress Notes (Signed)
[facility]  Patient ID: Yolanda Kelly,  MRN: [id number]  PCP: [name], PA  Date of Evaluation: 03/23/2020 Time spent:20 minutes  Chief Complaint:  Chief Complaint    Insomnia     Virtual Visit via Telephone or Video Note  I connected with patient by a video enabled telemedicine application or telephone, with their informed consent, and verified patient privacy and that I am speaking with the correct person using two identifiers.  I am private, in my office and the patient is home.  I discussed the limitations, risks, security and privacy concerns of performing an evaluation and management service by video and the availability of in person appointments. I also discussed with the patient that there may be a patient responsible charge related to this service. The patient expressed understanding and agreed to proceed.   I discussed the assessment and treatment plan with the patient. The patient was provided an opportunity to ask questions and all were answered. The patient agreed with the plan and demonstrated an understanding of the instructions.   The patient was advised to call back or seek an in-person evaluation if the symptoms worsen or if the condition fails to improve as anticipated.  I provided 20 minutes of non-face-to-face time during this encounter.   HISTORY/CURRENT STATUS: HPI  For routine med check.  Since starting back on the Sonata last month, she has been sleeping a lot better.  She is not needing it every night but does take it at night sometimes, and or the as needed dose for mid nocturnal awakening.  On rare occasion she has taken both doses.  Feels much better since she is getting a good night sleep every night.  Not needing the Xanax often.  It does still help when she needs it.  Still has OCD tendencies but is hesitant to take Xanax or definitely an SSRI to prevent the symptoms.  For the most part, the OCD is controlled enough that she is able  to function normally and do the necessary things in her life.  She is able to enjoy things.  She sleeps well.  Energy and motivation are good.  Not crying easily.  Denies forgetfulness.  No suicidal or homicidal thoughts.  Denies dizziness, syncope, seizures, numbness, tingling, tremor, tics, unsteady gait, slurred speech, confusion. Denies muscle or joint pain, stiffness, or dystonia.  Individual Medical History/ Review of Systems: Changes? :No    Past medications for mental health diagnoses include: Xanax, Unisom, Sonata, buspar caused h/a and nausea  Allergies: Other  Current Medications:  Current Outpatient Medications:  .  ALPRAZolam (XANAX) 0.25 MG tablet, Take 1 tablet (0.25 mg total) by mouth 2 (two) times daily as needed for anxiety., Disp: 30 tablet, Rfl: 1 .  zaleplon (SONATA) 10 MG capsule, 1 po qhs prn, and may repeat 1 for midnocturnal awakening, as long as she has 3 hours left to sleep., Disp: 60 capsule, Rfl: 0 .  baclofen (LIORESAL) 10 MG tablet, Take 1-2 tablets (10-20 mg total) by mouth at bedtime as needed for muscle spasms. (Patient not taking: Reported on 02/18/2020), Disp: 30 each, Rfl: 1 .  gabapentin (NEURONTIN) 300 MG capsule, Take 1 capsule (300 mg total) by mouth 3 (three) times daily as needed (for nerve pain). (Patient not taking: Reported on 02/18/2020), Disp: 90 capsule, Rfl: 1 .  Melatonin 3-10 MG TABS, Take by mouth. (Patient not taking: Reported on 03/23/2020), Disp: , Rfl:  Medication Side Effects: none  Family Medical/ Social History: Changes?  No  MENTAL HEALTH EXAM:  There were no vitals taken for this visit.There is no height or weight on file to calculate BMI.  General Appearance: Casual, Neat and Well Groomed  Eye Contact:  Good  Speech:  Clear and Coherent  Volume:  Normal  Mood:  Euthymic  Affect:  Appropriate  Thought Process:  Goal Directed and Descriptions of Associations: Intact  Orientation:  Full (Time, Place, and Person)  Thought  Content: Logical   Suicidal Thoughts:  No  Homicidal Thoughts:  No  Memory:  WNL  Judgement:  Good  Insight:  Good  Psychomotor Activity:  Normal from what I can see in the video.  Concentration:  Concentration: Good and Attention Span: Good  Recall:  Good  Fund of Knowledge: Good  Language: Good  Assets:  Desire for Improvement  ADL's:  Intact  Cognition: WNL  Prognosis:  Good    DIAGNOSES:    ICD-10-CM   1. Insomnia, unspecified type  G47.00   2. Mixed obsessional thoughts and acts  F42.2   3. Generalized anxiety disorder  F41.1     Receiving Psychotherapy: Yes With Stevphen Meuse, [facility]   RECOMMENDATIONS:  PDMP reviewed. I provided 20 minutes of nonface-to-face time during this encounter.. Continue Sonata 10 mg, 1 p.o. nightly as needed and 1 p.o. for mid nocturnal awakening as needed, as long as she has 3 hours left to sleep. Continue Xanax 0.25 mg 1 twice daily as needed.  Because she is needing the benzo so infrequently, and the fact that she does not want to take any other medicine to prevent the anxiety, I am not prescribing an SSRI for prevention of OCD and anxiety.   Continue therapy with Stevphen Meuse, [facility]. Return in 6 months.  [name], PA-C

## 2020-03-25 ENCOUNTER — Other Ambulatory Visit: Payer: Self-pay

## 2020-03-25 ENCOUNTER — Ambulatory Visit (INDEPENDENT_AMBULATORY_CARE_PROVIDER_SITE_OTHER): Payer: BC Managed Care – PPO

## 2020-03-25 DIAGNOSIS — Z23 Encounter for immunization: Secondary | ICD-10-CM | POA: Diagnosis not present

## 2020-03-25 NOTE — Progress Notes (Signed)
Per orders of [name], [name], injection of 3rd Hep B given by Gracy Racer in left deltoid. Patient tolerated injection well.

## 2020-03-27 ENCOUNTER — Telehealth: Payer: Self-pay | Admitting: Physician Assistant

## 2020-03-27 NOTE — Telephone Encounter (Signed)
LVM for patient to return call. 

## 2020-03-27 NOTE — Telephone Encounter (Signed)
LVM for patient to call back and schedule appt.     Medication reaction Reason for Call Symptomatic / Request for Health Information Initial Comment Caller states she might be having reaction to her 3rd HepB shot she got on Wednesday. Today the injection site is red, buring and itching. Translation No Nurse Assessment Nurse: Clarita Leber, RN, Deborah Date/Time (Eastern Time): 03/27/2020 11:04:46 AM Confirm and document reason for call. If symptomatic, describe symptoms. ---The caller states that she is having redness and burning at the injection side for Hepatitis B. Has the patient had close contact with a person known or suspected to have the novel coronavirus illness OR traveled / lives in area with major community spread (including international travel) in the last 14 days from the onset of symptoms? * If Asymptomatic, screen for exposure and travel within the last 14 days. ---No Does the patient have any new or worsening symptoms? ---Yes Will a triage be completed? ---Yes Related visit to physician within the last 2 weeks? ---Yes Does the PT have any chronic conditions? (i.e. diabetes, asthma, this includes High risk factors for pregnancy, etc.) ---No Is the patient pregnant or possibly pregnant? (Ask all females between the ages of 23-55) ---No Is this a behavioral health or substance abuse call? ---No Guidelines Guideline Title Affirmed Question Affirmed Notes Nurse Date/Time (Eastern Time) Immunization Reactions [1] Redness or red streak around the injection site AND [2] begins > 48 hours after shot AND [3] no fever (Exception: red Womble, RN, Gavin Pound 03/27/2020 11:06:10 AMPLEASE NOTE: All timestamps contained within this report are represented as [place] Standard Time. CONFIDENTIALTY NOTICE: This fax transmission is intended only for the addressee. It contains information that is legally privileged, confidential or otherwise protected from use or disclosure. If you are not  the intended recipient, you are strictly prohibited from reviewing, disclosing, copying using or disseminating any of this information or taking any action in reliance on or regarding this information. If you have received this fax in error, please notify us immediately by telephone so that we can arrange for its return to [place]. Phone: [phone], Toll-Free: [phone], Fax: [phone] Page: 2 of 2 Call Id: [id number] Guidelines Guideline Title Affirmed Question Affirmed Notes Nurse Date/Time Lamount Cohen Time) area < 1 inch or 2.5 cm wide) Disp. Time Lamount Cohen Time) Disposition Final User 03/27/2020 11:09:27 AM Call PCP within 24 Hours Yes Clarita Leber, RN, Jetty Duhamel Disagree/Comply Comply Caller Understands Yes PreDisposition Call Doctor Care Advice Given Per Guideline * Apply a cold pack or ice in a wet washcloth to the area for 20 minutes. * Fever occurs * You become worse. Referrals REFERRED TO PCP OFFICE

## 2020-03-27 NOTE — Telephone Encounter (Signed)
Spoke with patient. Sx are subsiding after icing area. Notified pt to take benadryl and it should go away in a few days.  Pt does not need and appointment.

## 2020-03-31 ENCOUNTER — Telehealth (INDEPENDENT_AMBULATORY_CARE_PROVIDER_SITE_OTHER): Payer: BC Managed Care – PPO | Admitting: Physician Assistant

## 2020-03-31 ENCOUNTER — Encounter: Payer: Self-pay | Admitting: Physician Assistant

## 2020-03-31 ENCOUNTER — Telehealth: Payer: Self-pay | Admitting: Physician Assistant

## 2020-03-31 VITALS — Ht 62.0 in | Wt 145.0 lb

## 2020-03-31 DIAGNOSIS — R229 Localized swelling, mass and lump, unspecified: Secondary | ICD-10-CM | POA: Diagnosis not present

## 2020-03-31 DIAGNOSIS — R200 Anesthesia of skin: Secondary | ICD-10-CM | POA: Diagnosis not present

## 2020-03-31 DIAGNOSIS — L309 Dermatitis, unspecified: Secondary | ICD-10-CM

## 2020-03-31 MED ORDER — PREDNISONE 20 MG PO TABS: ORAL_TABLET | ORAL | 0 refills | Status: DC

## 2020-03-31 NOTE — Telephone Encounter (Signed)
Pt called stating she saw [name] today for shingles flare up. Pt stated Lelon Mast was going to send in some prednisone to her pharmacy. Pt states she called pharmacy and there is no prescription there. Please advise.   Pt is requesting to use [facility] [address].

## 2020-03-31 NOTE — Progress Notes (Signed)
Virtual Visit via Video   I connected with Yolanda Kelly on 03/31/20 at 12:30 PM EDT by a video enabled telemedicine application and verified that I am speaking with the correct person using two identifiers. Location patient: Home Location provider: [facility], Office Persons participating in the virtual visit: Jhene, [place] PA-C  I discussed the limitations of evaluation and management by telemedicine and the availability of in person appointments. The patient expressed understanding and agreed to proceed.   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  Few days ago she started noticing possible recurrence of symptoms.  She states that she has a few lesions behind her right ear and an area of numbness.  She denies dizziness, slurred speech, unusual headaches.  She actually has a follow-up with neurology on 04/09/2020.  Skin nodule She has noticed an oblong small mass on L upper arm has been there for 6 months. Has noticed some shoulder sensitivity. Has occasional tenderness. Has possibly gotten slightly bigger. Doesn't feel like a cyst that needs to be drained.  ROS: See pertinent positives and negatives per HPI.  Patient Active Problem List   Diagnosis Date Noted  . Microcytosis 07/22/2019  . Generalized anxiety disorder 06/25/2018  . Mixed obsessional thoughts and acts 06/25/2018  . Insomnia 05/23/2018  . OCD (obsessive compulsive disorder) 03/29/2017  . Migraine without aura and without status migrainosus, not intractable 02/16/2017  . 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 Rectal mucosa prolapse 07/24/2014    Social History   Tobacco Use  . Smoking status: Never Smoker  . Smokeless tobacco: Never Used  Substance Use Topics  . Alcohol use: No    Current Outpatient Medications:  .  ALPRAZolam (XANAX) 0.25 MG tablet, Take 1 tablet (0.25 mg total) by mouth 2 (two)  times daily as needed for anxiety., Disp: 30 tablet, Rfl: 1 .  baclofen (LIORESAL) 10 MG tablet, Take 1-2 tablets (10-20 mg total) by mouth at bedtime as needed for muscle spasms., Disp: 30 each, Rfl: 1 .  valACYclovir (VALTREX) 1000 MG tablet, Take 1,000 mg by mouth 3 (three) times daily., Disp: , Rfl:  .  zaleplon (SONATA) 10 MG capsule, 1 po qhs prn, and may repeat 1 for midnocturnal awakening, as long as she has 3 hours left to sleep., Disp: 60 capsule, Rfl: 0  Allergies  Allergen Reactions  . Other Anaphylaxis    Coke and red mt. Dew, Throat closes up    Objective:   VITALS: Per patient if applicable, see vitals. GENERAL: Alert, appears well and in no acute distress. HEENT: Atraumatic, conjunctiva clear, no obvious abnormalities on inspection of external nose and ears. NECK: Normal movements of the head and neck. CARDIOPULMONARY: No increased WOB. Speaking in clear sentences. I:E ratio WNL.  MS: Moves all visible extremities without noticeable abnormality. PSYCH: Pleasant and cooperative, well-groomed. Speech normal rate and rhythm. Affect is appropriate. Insight and judgement are appropriate. Attention is focused, linear, and appropriate.  NEURO: CN grossly intact. Oriented as arrived to appointment on time with no prompting. Moves both UE equally.  SKIN: No obvious lesions, wounds, erythema, or cyanosis noted on face or hands.  Assessment and Plan:   Wrenley was seen today for herpes zoster.  Diagnoses and all orders for this visit:  Facial numbness History of Ramsay Hunt syndrome, will go ahead and start oral Valtrex 1 g 3 times daily x1 week, she  actually already has a prescription of this from Dr. Clementeen Graham.  We will also go ahead and start oral prednisone.  No evidence of severe acute neurological issues warranting ER evaluation.  Worsening precautions advised, close follow-up with neurology about possible further evaluation of repeated episodes of this.  Skin  nodule Difficult to evaluate on virtual visit.  She has a dermatologist and I recommend that she start out at this office and let us know if she needs further evaluation.   . Reviewed expectations re: course of current medical issues. . Discussed self-management of symptoms. . Outlined signs and symptoms indicating need for more acute intervention. . Patient verbalized understanding and all questions were answered. Marland Kitchen Health Maintenance issues including appropriate healthy diet, exercise, and smoking avoidance were discussed with patient. . See orders for this visit as documented in the electronic medical record.  I discussed the assessment and treatment plan with the patient. The patient was provided an opportunity to ask questions and all were answered. The patient agreed with the plan and demonstrated an understanding of the instructions.   The patient was advised to call back or seek an in-person evaluation if the symptoms worsen or if the condition fails to improve as anticipated.   CMA or LPN served as scribe during this visit. History, Physical, and Plan performed by medical provider. The above documentation has been reviewed and is accurate and complete.  Time spent with patient today was 25 minutes which consisted of chart review, discussing diagnosis, work up, treatment answering questions and documentation.   [place], [name] 03/31/2020

## 2020-03-31 NOTE — Telephone Encounter (Signed)
Prednisone sent in. Pt notified.

## 2020-04-05 ENCOUNTER — Encounter: Payer: Self-pay | Admitting: Physician Assistant

## 2020-04-08 NOTE — Progress Notes (Signed)
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  Notes from PCP and Sports Medicine reviewed.  UPDATE: She was last seen in 2018 for similar symptoms.  Workup was negative.  Symptoms did resolve but restarted in January.  At that time, she developed pain and tightness in the right side of her jaw.  She took prednisone and was treated for TMJ dysfunction.  She had her first COVID vaccine in late February.  Afterwards, she developed another case of shingles with vesicular rash in her right ear.  She developed her previous symptoms.  She developed right sided ear pain, right sided facial numbness and twitching, numbness of her tongue, right sided neck stiffness, blurred vision, twitching in back of her head, trouble closing both eyes, myalgias and fasciculations in the legs as well as polyarthralgias.  If she grasps an object, sometimes she will feel a shooting pain radiating up her arm.  She also developed hair loss.  She received a course of prednisone and valacyclovir.  Symptoms resolved but developed another case of shingles again after her second vaccine in May.  It subsequently resolved but symptoms have since returned in early July with no known trigger and this time without a rash.     For excessive daytime somnolence and possible narcolepsy, she underwent a sleep study which was unremarkable.    She has also seen Sports Medicine for myalgias and muscle spasms.  Again, labs from April were normal, including BC, CMP, CK, sed rate, TSH and Mg.  HISTORY: In September 2017, she developed right sided occipital headache that radiated to the ear.  She had associated fatigue, sore throat and lymphadenopathy.  Symptoms resolved with corticosteroids.  About 3 days later, she developed right sided facial weakness with associated right sided facial  paresthesia, right ear pain and aural fullness with few erythematous vesicles in the right external auditory canal.  She was diagnosed with Bell's palsy.  A couple of days later, she developed left facial twitching.  [facility]16 and was noted to have complete right facial nerve palsy and mild left facial nerve palsy.  MRI of brain with and without contrast revealed no acute infarct or white matter changes, but did show subtle enhancement within the fundus of the right internal auditory canal, which may be seen with Bell's palsy.  She underwent lumbar puncture with CSF analysis demonstrating cell count of 4 (predominantly lymphs), glucose 74,  protein 28, gram stain and culture negative, VDRL negative, Lyme IgG/IgM negative and AFB negative.  Serum testing included CBC with WBC 16.5 (94.7% neutrophil), HGB 13.2, HCT 39.2, PLT 329; negative toxoplasma;, ANA negative; and HIV negative.  She had positive EBV antibodies, suggestive of acute infection.  She was discharged on prednisone and valacyclovir.  She subsequently followed up with outpatient neurology.  She returned to the ED on 05/31/16 due to onset of left facial numbness and advised to continue her corticosteroids and antiviral medication.  She was evaluated by infectious disease, who believed that the Bell's palsy was likely secondary to herpes simplex or varicella rather than EBV.  Over the next 4 months, most of her symptoms resolved with only very subtle residual right sided facial weakness and synkinesis.  In May 2018, she developed eye discomfort and peripheral vision loss, followed by developed bitemporal  pressure, which subsequently was associated with nausea.  She saw ophthalmology who dilated her eyes and she continued to have bilateral blurred vision since the visit.  She followed up with ophthalmology for re-evaluation.  Exam revealed mild inferonasal visual field defect, but otherwise normal exam  with no evidence of optic neuritis.  She followed up with neurology who diagnosed her with intractable migraine, as she does have history of occasional migraine with visual aura.  She had another MRI of the brain with and without contrast on 01/30/17, which was normal.  Over the course of a month, the head pressure and visual disturbance resolved.  She subsequently developed fatigue.  She reported insomnia and diarrhea.  She has developed spasms and twitching in her legs and torso.  She also developed cramps in the calves.  Symptoms mostly are noticeable at night.  She also reports paresthesias in the fingers and toes.  She denies weakness.  Repeat EBV panel from 02/16/17 demonstrated negative EBV IgM and positive EBV IgG, suggesting prior but not acute infection.  Other labs were also unremarkable, including CBC, ANA, CMP, folate, B12 (596), RMSF IgG, and Lyme IgG/IgM.  She underwent NCV-EMG on 04/06/17, which was normal.  To evaluate muscle cramps, CK and Mg were checked on 04/03/17, which were normal.  She reports the ear pressure and tinnitus has improved and that the cramps have become less intense.  However, she still has muscle twitches and involuntary movements.  Sometimes, she reports that a certain part of a limb will swell.  For the past few weeks, she reports back and neck pain.  Recent repeat eye exam was unremarkable.  PAST MEDICAL HISTORY: Past Medical History:  Diagnosis Date  . Anxiety   . Bell's palsy   . OCD (obsessive compulsive disorder)     MEDICATIONS: Current Outpatient Medications on File Prior to Visit  Medication Sig Dispense Refill  . ALPRAZolam (XANAX) 0.25 MG tablet Take 1 tablet (0.25 mg total) by mouth 2 (two) times daily as needed for anxiety. 30 tablet 1  . baclofen (LIORESAL) 10 MG tablet Take 1-2 tablets (10-20 mg total) by mouth at bedtime as needed for muscle spasms. 30 each 1  . predniSONE (DELTASONE) 20 MG tablet Take one tablet twice a day for 5 days 10 tablet  0  . valACYclovir (VALTREX) 1000 MG tablet Take 1,000 mg by mouth 3 (three) times daily.    . zaleplon (SONATA) 10 MG capsule 1 po qhs prn, and may repeat 1 for midnocturnal awakening, as long as she has 3 hours left to sleep. 60 capsule 0   No current facility-administered medications on file prior to visit.    ALLERGIES: Allergies  Allergen Reactions  . Other Anaphylaxis    Coke and red mt. Dew, Throat closes up    FAMILY HISTORY: Family History  Problem Relation Age of Onset  . Thyroid disease Mother   . Kidney cancer Mother   . Asthma Father   . Irritable bowel syndrome Father   . Heart disease Sister   . Diabetes Mellitus II Maternal Grandmother   . Mental illness Maternal Grandfather   . Glaucoma Paternal Grandfather   . Celiac disease Sister   . Celiac disease Son        tests high for it, not 100% sure he has it  . Breast cancer Maternal Aunt   . Colon cancer Neg Hx   . Throat cancer Neg Hx   . Rectal cancer Neg Hx  SOCIAL HISTORY: Social History   Socioeconomic History  . Marital status: Married    Spouse name: Not on file  . Number of children: 3  . Years of education: college  . Highest education level: Bachelor's degree (e.g., BA, AB, BS)  Occupational History  . 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 Smoking status: Never Smoker  . Smokeless tobacco: Never Used  Vaping Use  . Vaping Use: Never used  Substance and Sexual Activity  . Alcohol use: No  . Drug use: No  . Sexual activity: Yes    Partners: Male  Other Topics Concern  . Not on file  Social History Narrative   Lives at home with husband and three children.   Right-handed.   No daily caffeine use.   Stay-at-home mother.   Fun: chase kiddos, reading, hiking   Social Determinants of Health   Financial Resource Strain:   . Difficulty of Paying Living Expenses:   Food Insecurity:   . Worried About Programme researcher, broadcasting/film/video in the Last Year:   . Barista in the Last Year:     Transportation Needs:   . Freight forwarder (Medical):   Marland Kitchen Lack of Transportation (Non-Medical):   Physical Activity:   . Days of Exercise per Week:   . Minutes of Exercise per Session:   Stress:   . Feeling of Stress :   Social Connections:   . Frequency of Communication with Friends and Family:   . Frequency of Social Gatherings with Friends and Family:   . Attends Religious Services:   . Active Member of Clubs or Organizations:   . Attends Banker Meetings:   Marland Kitchen Marital Status:   Intimate Partner Violence:   . Fear of Current or Ex-Partner:   . Emotionally Abused:   Marland Kitchen Physically Abused:   . Sexually Abused:     PHYSICAL EXAM: Blood pressure 116/78, pulse 72, height 5\' 2"  (1.575 m), weight 147 lb 9.6 oz (67 kg), SpO2 98 %. General: No acute distress.  Patient appears well-groomed.   Head:  Normocephalic/atraumatic Eyes:  Fundi examined but not visualized Neck: supple, no paraspinal tenderness, full range of motion Heart:  Regular rate and rhythm Lungs:  Clear to auscultation bilaterally Back: No paraspinal tenderness Neurological Exam: alert and oriented to person, place, and time. Attention span and concentration intact, recent and remote memory intact, fund of knowledge intact.  Speech fluent and not dysarthric, language intact.  CN II-XII intact. Bulk and tone normal, muscle strength 5/5 throughout.  Sensation to light touch, temperature and vibration intact.  Deep tendon reflexes 2+ throughout, toes downgoing.  Finger to nose and heel to shin testing intact.  Gait normal, Romberg negative.  IMPRESSION: 1.  Right sided Ramsay-Hunt syndrome, recurrent, with associated symptoms such as paresthesias, myalgias, fatigue, polyarthralgia.  Ultimately, it sounds like she is having some post-viral syndrome.  The first two events are compatible with the vaccine.  This recent event was unprovoked.  She has had an extensive workup in the past for her prior episodes,  including MRI, CSF analysis, extensive blood work (including autoimmune testing), and NCV-EMG.  Initial workup was positive for EBV, but otherwise unremarkable.  I have no reason to believe these symptoms would be due to anything other than post-viral.  However, I would like to repeat MRI of brain with and without contrast to evaluate for any CNS etiology.  PLAN: MRI of brain with and without contrast Further recommendations pending results.  Shon Millet, DO  CC:  [name], PA

## 2020-04-09 ENCOUNTER — Other Ambulatory Visit: Payer: Self-pay

## 2020-04-09 ENCOUNTER — Encounter: Payer: Self-pay | Admitting: Neurology

## 2020-04-09 ENCOUNTER — Ambulatory Visit (INDEPENDENT_AMBULATORY_CARE_PROVIDER_SITE_OTHER): Payer: BC Managed Care – PPO | Admitting: Neurology

## 2020-04-09 VITALS — BP 116/78 | HR 72 | Ht 62.0 in | Wt 147.6 lb

## 2020-04-09 DIAGNOSIS — R200 Anesthesia of skin: Secondary | ICD-10-CM

## 2020-04-09 DIAGNOSIS — G933 Postviral fatigue syndrome: Secondary | ICD-10-CM

## 2020-04-09 DIAGNOSIS — B0221 Postherpetic geniculate ganglionitis: Secondary | ICD-10-CM

## 2020-04-09 DIAGNOSIS — G9331 Postviral fatigue syndrome: Secondary | ICD-10-CM

## 2020-04-09 NOTE — Patient Instructions (Addendum)
At this point, I think this is a postviral syndrome.  I do want to repeat MRI of brain with and without contrast.  Further recommendations pending results.  Follow up next available

## 2020-04-10 ENCOUNTER — Other Ambulatory Visit: Payer: Self-pay | Admitting: Physician Assistant

## 2020-04-10 DIAGNOSIS — B0221 Postherpetic geniculate ganglionitis: Secondary | ICD-10-CM

## 2020-04-13 ENCOUNTER — Ambulatory Visit: Payer: BC Managed Care – PPO | Admitting: Psychiatry

## 2020-04-14 ENCOUNTER — Other Ambulatory Visit: Payer: Self-pay

## 2020-04-14 ENCOUNTER — Encounter: Payer: Self-pay | Admitting: Physician Assistant

## 2020-04-14 ENCOUNTER — Ambulatory Visit (INDEPENDENT_AMBULATORY_CARE_PROVIDER_SITE_OTHER): Payer: BC Managed Care – PPO | Admitting: Physician Assistant

## 2020-04-14 VITALS — BP 112/76 | HR 81 | Temp 98.2°F | Ht 62.0 in | Wt 147.6 lb

## 2020-04-14 DIAGNOSIS — R591 Generalized enlarged lymph nodes: Secondary | ICD-10-CM

## 2020-04-14 DIAGNOSIS — L659 Nonscarring hair loss, unspecified: Secondary | ICD-10-CM

## 2020-04-14 DIAGNOSIS — K1370 Unspecified lesions of oral mucosa: Secondary | ICD-10-CM

## 2020-04-14 LAB — COMPREHENSIVE METABOLIC PANEL
AG Ratio: 1.6 (calc) (ref 1.0–2.5)
ALT: 16 U/L (ref 6–29)
AST: 18 U/L (ref 10–30)
Albumin: 4.4 g/dL (ref 3.6–5.1)
Alkaline phosphatase (APISO): 81 U/L (ref 31–125)
BUN: 12 mg/dL (ref 7–25)
CO2: 27 mmol/L (ref 20–32)
Calcium: 9.5 mg/dL (ref 8.6–10.2)
Chloride: 102 mmol/L (ref 98–110)
Creat: 0.74 mg/dL (ref 0.50–1.10)
Globulin: 2.8 g/dL (calc) (ref 1.9–3.7)
Glucose, Bld: 75 mg/dL (ref 65–99)
Potassium: 4.2 mmol/L (ref 3.5–5.3)
Sodium: 138 mmol/L (ref 135–146)
Total Bilirubin: 0.4 mg/dL (ref 0.2–1.2)
Total Protein: 7.2 g/dL (ref 6.1–8.1)

## 2020-04-14 LAB — CBC WITH DIFFERENTIAL/PLATELET
Absolute Monocytes: 573 cells/uL (ref 200–950)
Basophils Absolute: 59 cells/uL (ref 0–200)
Basophils Relative: 0.5 %
Eosinophils Absolute: 82 cells/uL (ref 15–500)
Eosinophils Relative: 0.7 %
HCT: 40.4 % (ref 35.0–45.0)
Hemoglobin: 12.5 g/dL (ref 11.7–15.5)
Lymphs Abs: 2480 cells/uL (ref 850–3900)
MCH: 22.6 pg — ABNORMAL LOW (ref 27.0–33.0)
MCHC: 30.9 g/dL — ABNORMAL LOW (ref 32.0–36.0)
MCV: 72.9 fL — ABNORMAL LOW (ref 80.0–100.0)
MPV: 10.2 fL (ref 7.5–12.5)
Monocytes Relative: 4.9 %
Neutro Abs: 8506 cells/uL — ABNORMAL HIGH (ref 1500–7800)
Neutrophils Relative %: 72.7 %
Platelets: 346 10*3/uL (ref 140–400)
RBC: 5.54 10*6/uL — ABNORMAL HIGH (ref 3.80–5.10)
RDW: 15.7 % — ABNORMAL HIGH (ref 11.0–15.0)
Total Lymphocyte: 21.2 %
WBC: 11.7 10*3/uL — ABNORMAL HIGH (ref 3.8–10.8)

## 2020-04-14 NOTE — Progress Notes (Signed)
Yolanda Kelly is a 34 y.o. female is here to discuss: hair loss.   History of Present Illness:   Chief Complaint  Patient presents with  . Hair loss    HPI   Hair loss Has been going on intermittently for the past few years, comes and goes but overall worsening in the past month. She feels as though her hairline is slightly receeding in one area. Denies changes to her hair products. Doesn't take any supplements except B12 which she just started about two weeks ago. Denies significant patches of loss. Doesn't pull hair. Has not had pain/lesions.  She does reports that her periods for the last 6 months have been unpredictable, taking longer to start, sometimes by up to two weeks. Less painful and lighter than normal. Did not have any difficulty getting pregnant. No prior hx of ovarian cysts.  R lymphadenopathy Recent Ramsey Hunt Auricular syndrome. She has had persistent lymphnode x >1 month. Denies other lymph nodes. Area waxes and wanes in size. Denies severe pain.  Oral lesions Had blisters pop up on the roof of her mouth a week or so ago. The area has been quite tender and she has had to modify her diet to eat softer foods to bother the area less. Overall, symptoms are improving with time.  Health Maintenance Due  Topic Date Due  . Hepatitis C Screening  Never done  . INFLUENZA VACCINE  04/12/2020  . PAP SMEAR-Modifier  04/13/2020    Past Medical History:  Diagnosis Date  . Anxiety   . Bell's palsy   . OCD (obsessive compulsive disorder)   . Thalassanemia 2021     Social History   Tobacco Use  . Smoking status: Never Smoker  . Smokeless tobacco: Never Used  Vaping Use  . Vaping Use: Never used  Substance Use Topics  . Alcohol use: No  . Drug use: No    Past Surgical History:  Procedure Laterality Date  . BUNIONECTOMY Right     Family History  Problem Relation Age of Onset  . Thyroid disease Mother   . Kidney cancer Mother   . Asthma Father   . Irritable  bowel syndrome Father   . Heart disease Sister   . Diabetes Mellitus II Maternal Grandmother   . Mental illness Maternal Grandfather   . Glaucoma Paternal Grandfather   . Celiac disease Sister   . Celiac disease Son        tests high for it, not 100% sure he has it  . Breast cancer Maternal Aunt   . Colon cancer Neg Hx   . Throat cancer Neg Hx   . Rectal cancer Neg Hx     PMHx, SurgHx, SocialHx, FamHx, Medications, and Allergies were reviewed in the Visit Navigator and updated as appropriate.   Patient Active Problem List   Diagnosis Date Noted  . Microcytosis 07/22/2019  . Generalized anxiety disorder 06/25/2018  . Mixed obsessional thoughts and acts 06/25/2018  . Insomnia 05/23/2018  . OCD (obsessive compulsive disorder) 03/29/2017  . Migraine without aura and without status migrainosus, not intractable 02/16/2017  . 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 Rectal mucosa prolapse 07/24/2014    Social History   Tobacco Use  . Smoking status: Never Smoker  . Smokeless tobacco: Never Used  Vaping Use  . Vaping Use: Never used  Substance Use Topics  . Alcohol use: No  . Drug use: No    Current Medications and Allergies:    Current  Outpatient Medications:  .  cyanocobalamin 100 MCG tablet, Take 100 mcg by mouth daily., Disp: , Rfl:  .  ALPRAZolam (XANAX) 0.25 MG tablet, Take 1 tablet (0.25 mg total) by mouth 2 (two) times daily as needed for anxiety. (Patient not taking: Reported on 04/09/2020), Disp: 30 tablet, Rfl: 1 .  baclofen (LIORESAL) 10 MG tablet, Take 1-2 tablets (10-20 mg total) by mouth at bedtime as needed for muscle spasms. (Patient not taking: Reported on 04/09/2020), Disp: 30 each, Rfl: 1 .  zaleplon (SONATA) 10 MG capsule, 1 po qhs prn, and may repeat 1 for midnocturnal awakening, as long as she has 3 hours left to sleep. (Patient not taking: Reported on 04/14/2020), Disp: 60 capsule, Rfl: 0   Allergies  Allergen Reactions  . Other Anaphylaxis    Coke  and red mt. Dew, Throat closes up    Review of Systems   ROS  Negative unless otherwise specified per HPI.  Vitals:   Vitals:   04/14/20 1330  BP: 112/76  Pulse: 81  Temp: 98.2 F (36.8 C)  TempSrc: Temporal  SpO2: 98%  Weight: 147 lb 9.6 oz (67 kg)  Height: 5\' 2"  (1.575 m)     Body mass index is 27 kg/m.   Physical Exam:    Physical Exam Vitals and nursing note reviewed.  Constitutional:      General: She is not in acute distress.    Appearance: She is well-developed. She is not ill-appearing or toxic-appearing.  HENT:     Mouth/Throat:     Comments: Blistery lesions to roof of L side of mouth with erythematous base Cardiovascular:     Rate and Rhythm: Normal rate and regular rhythm.     Pulses: Normal pulses.     Heart sounds: Normal heart sounds, S1 normal and S2 normal.     Comments: No LE edema Pulmonary:     Effort: Pulmonary effort is normal.     Breath sounds: Normal breath sounds.  Lymphadenopathy:     Head:     Right side of head: Posterior auricular adenopathy present.  Skin:    General: Skin is warm and dry.     Comments: Small area of hair loss at the R area of her hairline; no obvious large patches of hair loss noted  Neurological:     Mental Status: She is alert.     GCS: GCS eye subscore is 4. GCS verbal subscore is 5. GCS motor subscore is 6.  Psychiatric:        Speech: Speech normal.        Behavior: Behavior normal. Behavior is cooperative.      Assessment and Plan:    Neeti was seen today for hair loss.  Diagnoses and all orders for this visit:  Lymphadenopathy Given persistence, will obtain u/s and also update CBC and CMP today. Further work-up based on results. -     Rolly Salter Soft Tissue Head/Neck (NON-THYROID); Future -     CBC with Differential/Platelet; Future -     Comprehensive metabolic panel; Future -     Comprehensive metabolic panel -     CBC with Differential/Platelet  Oral lesion Overall improving. Offered magic  mouthwash but she declined. She is seeing ID in <2 weeks for further evaluation of repeated viral episodes.  Hair loss Thyroid recently checked and normal. Update CBC and CMP today. If worsening, will refer to dermatology.  . Reviewed expectations re: course of current medical issues. . Discussed self-management of  symptoms. . Outlined signs and symptoms indicating need for more acute intervention. . Patient verbalized understanding and all questions were answered. . See orders for this visit as documented in the electronic medical record. . Patient received an After Visit Summary.  CMA or LPN served as scribe during this visit. History, Physical, and Plan performed by medical provider. The above documentation has been reviewed and is accurate and complete.   [name], PA-C [place] 04/14/2020  Follow-up: No follow-ups on file.

## 2020-04-14 NOTE — Patient Instructions (Signed)
It was great to see you!  You will be contacted about your ultrasound.  If hair loss worsens or if you have any lesions on your scalp, let me know and we will refer you to dermatology.  Lelon Mast

## 2020-04-15 ENCOUNTER — Other Ambulatory Visit: Payer: Self-pay | Admitting: Physician Assistant

## 2020-04-15 MED ORDER — MAGIC MOUTHWASH W/LIDOCAINE: 5.0000 mL | Freq: Three times a day (TID) | ORAL | 0 refills | Status: AC | PRN

## 2020-04-17 ENCOUNTER — Ambulatory Visit
Admission: RE | Admit: 2020-04-17 | Discharge: 2020-04-17 | Disposition: A | Discharge status: Home / Self Care | Payer: BC Managed Care – PPO | Source: Ambulatory Visit | Attending: Physician Assistant | Admitting: Physician Assistant

## 2020-04-17 DIAGNOSIS — R599 Enlarged lymph nodes, unspecified: Secondary | ICD-10-CM | POA: Diagnosis not present

## 2020-04-17 DIAGNOSIS — R591 Generalized enlarged lymph nodes: Secondary | ICD-10-CM

## 2020-04-27 ENCOUNTER — Encounter: Payer: Self-pay | Admitting: *Deleted

## 2020-04-27 ENCOUNTER — Encounter: Payer: Self-pay | Admitting: Physician Assistant

## 2020-04-28 ENCOUNTER — Ambulatory Visit (INDEPENDENT_AMBULATORY_CARE_PROVIDER_SITE_OTHER): Payer: BC Managed Care – PPO | Admitting: Internal Medicine

## 2020-04-28 ENCOUNTER — Other Ambulatory Visit: Payer: Self-pay

## 2020-04-28 ENCOUNTER — Encounter: Payer: Self-pay | Admitting: Internal Medicine

## 2020-04-28 VITALS — BP 105/72 | HR 85 | Temp 99.0°F | Wt 150.0 lb

## 2020-04-28 DIAGNOSIS — B0221 Postherpetic geniculate ganglionitis: Secondary | ICD-10-CM

## 2020-04-28 NOTE — Progress Notes (Signed)
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  I cannot find any documentation of a typical shingles rash.  I do not think she had acute Epstein-Barr virus infection as her antibodies would not be positive within days of becoming ill.  The infectious disease doctor's opinion that her infection might have been due to varicella or herpes simplex is purely speculation.  The fact that she had bilateral symptoms goes against varicella.  The myriad symptoms she has had this year are very unlikely to be due to recurrent shingles given the diffuse bilateral nature of her symptoms.  If she has recurrent skin lesions suggestive of varicella I would culture or biopsy them to try to confirm infection otherwise I would not do any further evaluation or treatment for infection.  Plan: 1. Follow-up here as needed.  Patient Active Problem List   Diagnosis Date Noted  . 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 Microcytosis 07/22/2019  . Generalized anxiety disorder 06/25/2018  . Mixed obsessional thoughts and acts 06/25/2018  . Insomnia 05/23/2018  . OCD (obsessive compulsive disorder) 03/29/2017  . Migraine without aura and without status migrainosus, not intractable 02/16/2017  . Rectal mucosa prolapse 07/24/2014    Patient's Medications  New Prescriptions   No medications on file  Previous Medications   ALPRAZOLAM (XANAX) 0.25 MG TABLET    Take 1 tablet (0.25 mg total) by mouth 2 (two) times daily as needed for anxiety.   BACLOFEN (LIORESAL) 10 MG TABLET    Take 1-2 tablets (10-20 mg total) by mouth at bedtime as needed for muscle spasms.   CYANOCOBALAMIN 100 MCG TABLET    Take 100 mcg by mouth daily.   ZALEPLON (SONATA) 10 MG CAPSULE    1 po qhs  prn, and may repeat 1 for midnocturnal awakening, as long as she has 3 hours left to sleep.  Modified Medications   No medications on file  Discontinued Medications   No medications on file    HPI: Ouita Nish is a 34 y.o. female who developed right-sided headache, ear pain and sore throat in early September 2017.  She then developed right facial weakness and twitching of the left side of her face. She was evaluated in the emergency department and was sent to have bilateral Bell's palsy with complete right facial weakness and slight left facial weakness.  No skin lesions were documented.  She was seen by neurology.  Extensive testing was done which included brain MRI which showed possible inflammation of the right internal auditory canal.  Lumbar puncture was unremarkable.  Epstein-Barr virus serology revealed a negative viral capsid antigen IgM, positive early antigen IgG and positive viral capsid antigen IgG.  She was seen several weeks later by infectious disease and gave a history of having some red lesions in her right ear.  The conclusion was that her acute illness was probably due to varicella or herpes simplex infection.  She was treated with corticosteroids and had gradual resolution of her facial weakness.  She had some decrease in her hearing and tinnitus.  Her hearing slowly improved.  She was evaluated in May 2018 for right visual field defect.  She was diagnosed with migraine headaches.  By mid 2018 she felt like she had returned to  normal other than chronic fatigue.  In January of this year she developed some tightness in her right jaw.  TMJ syndrome was considered.  She received her first Covid vaccine in February and says that shortly thereafter she developed recurrent lesions behind her right ear.  I cannot find any record that these were seen by any of her providers.  She also noted some numbness and twitching of her face bilaterally, blurred vision, trouble closing both eyes, hair loss  and pain in her right arm.  She was treated with valacyclovir and prednisone.  She thinks that both of the medications may have helped.  She received her second Covid vaccine in May and had a similar reaction.  Last month she had similar symptoms and a lesion on the roof of her mouth.  She was treated with valacyclovir and prednisone again.  She also notes heightened hearing in certain circumstances, a difference in color perception between her eyes, blurred vision, photophobia, numbness of her tongue, shooting pain when she closes either eye, tinnitus right hand weakness, muscle cramps in her legs that looks like "an undulating twitch" and diffuse joint pains.  Review of Systems: Review of Systems  Constitutional: Positive for malaise/fatigue. Negative for chills, diaphoresis, fever and weight loss.  HENT: Positive for tinnitus. Negative for congestion, ear discharge, ear pain, hearing loss and sore throat.   Eyes: Positive for blurred vision and photophobia. Negative for double vision and redness.  Respiratory: Negative for cough and shortness of breath.   Cardiovascular: Negative for chest pain.  Gastrointestinal: Negative for abdominal pain, diarrhea, heartburn, nausea and vomiting.  Genitourinary: Negative for dysuria.  Musculoskeletal: Positive for joint pain and myalgias.  Neurological: Positive for sensory change, focal weakness and headaches.      Past Medical History:  Diagnosis Date  . Anxiety   . Bell's palsy   . OCD (obsessive compulsive disorder)   . Thalassanemia 2021    Social History   Tobacco Use  . Smoking status: Never Smoker  . Smokeless tobacco: Never Used  Vaping Use  . Vaping Use: Never used  Substance Use Topics  . Alcohol use: No  . Drug use: No    Family History  Problem Relation Age of Onset  . Thyroid disease Mother   . Kidney cancer Mother   . Asthma Father   . Irritable bowel syndrome Father   . Heart disease Sister   . Diabetes Mellitus  II Maternal Grandmother   . Mental illness Maternal Grandfather   . Glaucoma Paternal Grandfather   . Celiac disease Sister   . Celiac disease Son        tests high for it, not 100% sure he has it  . Breast cancer Maternal Aunt   . Colon cancer Neg Hx   . Throat cancer Neg Hx   . Rectal cancer Neg Hx    Allergies  Allergen Reactions  . Other Anaphylaxis    Coke and red mt. Dew, Throat closes up    OBJECTIVE: Vitals:   04/28/20 0959  BP: 105/72  Pulse: 85  Temp: 99 F (37.2 C)  TempSrc: Oral  Weight: 150 lb (68 kg)   Body mass index is 27.44 kg/m.   Physical Exam Constitutional:      General: She is not in acute distress. HENT:     Right Ear: Tympanic membrane and ear canal normal.     Left Ear: Tympanic membrane and ear canal normal.     Nose: No congestion.  Mouth/Throat:     Pharynx: Oropharynx is clear. No oropharyngeal exudate.     Comments: No overall lesions. Eyes:     Extraocular Movements: Extraocular movements intact.     Conjunctiva/sclera: Conjunctivae normal.     Pupils: Pupils are equal, round, and reactive to light.  Cardiovascular:     Rate and Rhythm: Normal rate and regular rhythm.     Heart sounds: No murmur heard.   Pulmonary:     Effort: Pulmonary effort is normal.     Breath sounds: Normal breath sounds.  Abdominal:     General: There is no distension.     Palpations: Abdomen is soft.     Tenderness: There is no abdominal tenderness.  Musculoskeletal:        General: No swelling or tenderness.  Skin:    Findings: No rash.  Neurological:     General: No focal deficit present.  Psychiatric:        Mood and Affect: Mood normal.     Microbiology: No results found for this or any previous visit (from the past 240 hour(s)).  Michel Bickers, MD [facility] [phone] pager   [phone] cell 04/28/2020, 12:25 PM

## 2020-04-29 ENCOUNTER — Other Ambulatory Visit: Payer: Self-pay | Admitting: Physician Assistant

## 2020-04-29 DIAGNOSIS — R253 Fasciculation: Secondary | ICD-10-CM | POA: Diagnosis not present

## 2020-04-29 DIAGNOSIS — D569 Thalassemia, unspecified: Secondary | ICD-10-CM

## 2020-04-29 DIAGNOSIS — G44209 Tension-type headache, unspecified, not intractable: Secondary | ICD-10-CM | POA: Diagnosis not present

## 2020-04-29 DIAGNOSIS — M9901 Segmental and somatic dysfunction of cervical region: Secondary | ICD-10-CM | POA: Diagnosis not present

## 2020-04-29 DIAGNOSIS — R201 Hypoesthesia of skin: Secondary | ICD-10-CM | POA: Diagnosis not present

## 2020-04-30 ENCOUNTER — Encounter: Payer: Self-pay | Admitting: Physician Assistant

## 2020-04-30 DIAGNOSIS — Z20822 Contact with and (suspected) exposure to covid-19: Secondary | ICD-10-CM | POA: Diagnosis not present

## 2020-05-02 DIAGNOSIS — R253 Fasciculation: Secondary | ICD-10-CM | POA: Diagnosis not present

## 2020-05-02 DIAGNOSIS — G44209 Tension-type headache, unspecified, not intractable: Secondary | ICD-10-CM | POA: Diagnosis not present

## 2020-05-02 DIAGNOSIS — M9901 Segmental and somatic dysfunction of cervical region: Secondary | ICD-10-CM | POA: Diagnosis not present

## 2020-05-02 DIAGNOSIS — R201 Hypoesthesia of skin: Secondary | ICD-10-CM | POA: Diagnosis not present

## 2020-05-04 DIAGNOSIS — Z713 Dietary counseling and surveillance: Secondary | ICD-10-CM | POA: Diagnosis not present

## 2020-05-05 ENCOUNTER — Ambulatory Visit
Admission: RE | Admit: 2020-05-05 | Discharge: 2020-05-05 | Disposition: A | Discharge status: Home / Self Care | Payer: BC Managed Care – PPO | Source: Ambulatory Visit | Attending: Neurology | Admitting: Neurology

## 2020-05-05 DIAGNOSIS — G9331 Postviral fatigue syndrome: Secondary | ICD-10-CM

## 2020-05-05 DIAGNOSIS — B0221 Postherpetic geniculate ganglionitis: Secondary | ICD-10-CM | POA: Diagnosis not present

## 2020-05-05 DIAGNOSIS — R200 Anesthesia of skin: Secondary | ICD-10-CM

## 2020-05-05 MED ORDER — GADOBENATE DIMEGLUMINE 529 MG/ML IV SOLN
14.0000 mL | Freq: Once | INTRAVENOUS | Status: AC | PRN
  Administered 2020-05-05: 14 mL via INTRAVENOUS

## 2020-05-06 ENCOUNTER — Telehealth: Payer: Self-pay

## 2020-05-06 NOTE — Telephone Encounter (Signed)
Pt advised of her MRI results.

## 2020-05-06 NOTE — Telephone Encounter (Signed)
-----   Message from Drema Dallas, DO sent at 05/06/2020 12:37 PM EDT ----- MRI of brain is normal.  I have no further recommendations at this time.

## 2020-05-12 ENCOUNTER — Ambulatory Visit: Payer: BC Managed Care – PPO | Admitting: Podiatry

## 2020-05-13 ENCOUNTER — Ambulatory Visit: Payer: BC Managed Care – PPO | Admitting: Psychiatry

## 2020-05-14 ENCOUNTER — Encounter: Payer: Self-pay | Admitting: Podiatry

## 2020-05-14 ENCOUNTER — Ambulatory Visit (INDEPENDENT_AMBULATORY_CARE_PROVIDER_SITE_OTHER): Payer: BC Managed Care – PPO | Admitting: Podiatry

## 2020-05-14 ENCOUNTER — Other Ambulatory Visit: Payer: Self-pay

## 2020-05-14 ENCOUNTER — Ambulatory Visit (INDEPENDENT_AMBULATORY_CARE_PROVIDER_SITE_OTHER): Payer: BC Managed Care – PPO

## 2020-05-14 DIAGNOSIS — T847XXA Infection and inflammatory reaction due to other internal orthopedic prosthetic devices, implants and grafts, initial encounter: Secondary | ICD-10-CM | POA: Diagnosis not present

## 2020-05-14 NOTE — Progress Notes (Signed)
She presents today after having not been to our clinic for the past couple of years the last time she was in she had surgery with Dr. Logan Bores.  She is currently with [place] today once again complaining of pain to the dorsal aspect of the right first metatarsal.  States that her surgery was on 6 6 of 2019 and has not felt right since.  She is status post [place] bunionectomy and a tarsal exostectomy medial cuneiform.  Objective: Vital signs are stable alert oriented x3 I reviewed her past medical history medications allergies surgeries and social history.  Pulses are strongly palpable neurologic sensorium is intact deep tendon reflexes are intact she has good range of motion of the first metatarsophalangeal joint she has pain on palpation of the neck of the first metatarsal.  Radiographs taken today demonstrate a loosening of the 3 oh screw that is present in the head of that metatarsal it is retracted by about 4 mm.  This is a prominent area that she feels with shoe gear and palpation.  Assessment: Pain secondary to internal fixation first metatarsal right.  Plan: I went ahead and consented her today for removal of a painful internal fixation screw 1 right.  She understands this is amenable to it we did discuss possible postop complications I expressed to her that most likely this will be a simple removal with a small stab incision she understands and is amenable to it like to be able to get back to work the following Monday.  We dispensed information regarding the surgery center anesthesia group she saw Dr. Shanon Rosser of the consent form and I will follow-up with her soon.

## 2020-05-28 DIAGNOSIS — R253 Fasciculation: Secondary | ICD-10-CM | POA: Diagnosis not present

## 2020-05-28 DIAGNOSIS — R201 Hypoesthesia of skin: Secondary | ICD-10-CM | POA: Diagnosis not present

## 2020-05-28 DIAGNOSIS — G44209 Tension-type headache, unspecified, not intractable: Secondary | ICD-10-CM | POA: Diagnosis not present

## 2020-05-28 DIAGNOSIS — M9901 Segmental and somatic dysfunction of cervical region: Secondary | ICD-10-CM | POA: Diagnosis not present

## 2020-06-02 ENCOUNTER — Other Ambulatory Visit: Payer: Self-pay

## 2020-06-02 ENCOUNTER — Ambulatory Visit (INDEPENDENT_AMBULATORY_CARE_PROVIDER_SITE_OTHER): Payer: BC Managed Care – PPO | Admitting: Psychiatry

## 2020-06-02 DIAGNOSIS — F422 Mixed obsessional thoughts and acts: Secondary | ICD-10-CM | POA: Diagnosis not present

## 2020-06-02 NOTE — Progress Notes (Signed)
[facility] Counselor/Therapist Progress Note  Patient ID: Yolanda Kelly, MRN: [id number],    Date: 06/02/2020  Time Spent: 50 minutes start time 9:05 AM end time 9:55 AM  Treatment Type: Individual Therapy  Reported Symptoms: anxiety, sleep issues, frustration,obsessive thinking, compulsive behaviors, intrusive thoughts  Mental Status Exam:  Appearance:   Well Groomed     Behavior:  Appropriate  Motor:  Normal  Speech/Language:   Normal Rate  Affect:  Appropriate  Mood:  normal  Thought process:  normal  Thought content:    WNL  Sensory/Perceptual disturbances:    WNL  Orientation:  oriented to person, place, time/date and situation  Attention:  Good  Concentration:  Good  Memory:  WNL  Fund of knowledge:   Good  Insight:    Good  Judgment:   Good  Impulse Control:  Good   Risk Assessment: Danger to Self:  No Self-injurious Behavior: No Danger to Others: No Duty to Warn:no Physical Aggression / Violence:No  Access to Firearms a concern: No  Gang Involvement:No   Subjective: Patient was prsent for session.  She shared she is adjusting to have all the children in school.  She is also moving and in the process of selling their house.  Patient has continued to have multiple tests run on her health but they have not found anything conclusive they have ruled out Parkinson's and ALS but they are still uncertain as to why she is having the physical issues.  Patient stated on the other hand they are getting less and that is helping.  She did report her compulsive behaviors have seemed to increase.  Discussed how that would be normal with all that is currently going on in transition with her life.  Was able to recognize that there are many things that are out of control in her world and the compulsive behavior are more about control.  Discussed the importance of finding out what her behaviors are trying to tell her rather than trying to stop them.  Patient reported that she felt  thinking that way would help her be able to manage them more appropriately.  She shared the more she tries does not do them the more that they occur.  Patient discussed one of her behaviors is having to check the locks on the door multiple times before bedtime.  She shared she tries to lay there and stop the obsessive intrusive thoughts but typically cannot go to sleep until she gives in and does multiple checks.  Encouraged patient to put a rubber band on each of the doorknobs and to remove it in the evening after she checks the walk on the door and then put the rubber bands on the nightstand so she can count them if she starts wondering whether or not the doors are locked.  In the morning she is to put the rubber bands back on each of the doors.  Patient reported feeling positive about that plan and that that would be helpful.  Patient was also encouraged to write out the different things that she is having the intrusive thoughts about that she cannot do anything concerning and put them in a God box to visualize letting that situation go.  Patient decided to do that for herself and to tell her kids about that as well so they can act on that.  Patient reported feeling much better at the end of session and would be practicing different techniques and strategies discussed in session.  Interventions: Cognitive Behavioral Therapy and Solution-Oriented/Positive Psychology  Diagnosis:   ICD-10-CM   1. Mixed obsessional thoughts and acts  F42.2     Plan: Patient is to utilize CBT and coping skills to decrease anxiety symptoms.  Patient is to implement strategies discussed in session including using rubber bands on doorknobs, the guide box, and trying to figure out what the compulsive behavior is telling her rather than trying to stop it. Long-term goal: Enhance ability to handle effectively the full variety of life's anxieties Short-term goal: Identify an anxiety coping mechanism that has been successful in the  past and his creases use  Stevphen Meuse, [facility]

## 2020-06-04 DIAGNOSIS — Z713 Dietary counseling and surveillance: Secondary | ICD-10-CM | POA: Diagnosis not present

## 2020-06-09 ENCOUNTER — Telehealth: Payer: Self-pay

## 2020-06-09 NOTE — Telephone Encounter (Addendum)
DOS 08/28/2020  REMOVAL FIXATION DEEP RT - 20680  [employer] EFFECTIVE DATE - 09/12/2018  PLAN DEDUCTIBLE - $3000.00 W/ $0.00 REMAINING OUT OF POCKET - $6000.00 W/ $2443.00 REMAINING COPAY $0.00 COINSURANCE - 0%  SPOKE TO LYNN AT [employer], SHE STATED NO AUTH REQUIRED FOR CPT 20680. CALL REF # [id number]

## 2020-06-17 DIAGNOSIS — R201 Hypoesthesia of skin: Secondary | ICD-10-CM | POA: Diagnosis not present

## 2020-06-17 DIAGNOSIS — M9901 Segmental and somatic dysfunction of cervical region: Secondary | ICD-10-CM | POA: Diagnosis not present

## 2020-06-17 DIAGNOSIS — R253 Fasciculation: Secondary | ICD-10-CM | POA: Diagnosis not present

## 2020-06-17 DIAGNOSIS — G44209 Tension-type headache, unspecified, not intractable: Secondary | ICD-10-CM | POA: Diagnosis not present

## 2020-06-18 DIAGNOSIS — D2362 Other benign neoplasm of skin of left upper limb, including shoulder: Secondary | ICD-10-CM | POA: Diagnosis not present

## 2020-06-23 NOTE — Progress Notes (Signed)
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  UPDATE: MRI of brain with and without contrast on 05/05/2020 was personally reviewed and was normal.  Most of symptoms have improved.  Still has cramps in the right more than left leg.  No involuntary motions, but mildly in the right hand where she has cramps in the right arm.  Sometimes with headache but now only mild.    Over the summer, she reports that she had a recurrence of a blistering rash on the inside of the left side of her upper lip with associated numbness and tingling, lasting several days.  It is uncertain what is the etiology of her symptoms.  Regarding the rash, ID suggested biopsy if she should have a recurrence.  HISTORY: In September 2017, she developed right sided occipital headache that radiated to the ear. She had associated fatigue, sore throat and lymphadenopathy. Symptoms resolved with corticosteroids. About 3 days later, she developed right sided facial weakness with associated right sided facial paresthesia, right ear pain and aural fullness with few erythematous vesicles in the right external auditory canal. She was diagnosed with Bell's palsy. A couple of days later, she developed left facial twitching. She presented to the ED at [facility] on 05/22/16 and was noted to have complete right facial nerve palsy and mild left facial nerve palsy. MRI of brain with and without contrast revealed no acute infarct or white matter changes, but did show subtle enhancement within the fundus of the right internal auditory canal, which may be seen with Bell's palsy. She underwent lumbar puncture with CSF analysis demonstrating cell count of 4 (predominantly lymphs), glucose 74, protein 28, gram stain and culture negative, VDRL negative, Lyme IgG/IgM negative  and AFB negative. Serum testing included CBC with WBC 16.5 (94.7% neutrophil), HGB 13.2, HCT 39.2, PLT 329; negative toxoplasma;, ANA negative; and HIV negative. She had positive EBV antibodies, suggestive of acute infection. She was discharged on prednisone and valacyclovir. She subsequently followed up with outpatient neurology. She returned to the ED on 05/31/16 due to onset of left facial numbness and advised to continue her corticosteroids and antiviral medication. She was evaluated by infectious disease, who believed that the Bell's palsy was likely secondary to herpes simplex or varicella rather than EBV. Over the next 4 months, most of her symptoms resolved with only very subtle residual right sided facial weakness and synkinesis.  In May 2018, she developed eye discomfort and peripheral vision loss, followed by developed bitemporal pressure, which subsequently was associated with nausea. She saw ophthalmology who dilated her eyes and she continued to have bilateral blurred vision since the visit. She followed up with ophthalmology for re-evaluation. Exam revealed mild inferonasal visual field defect, but otherwise normal exam with no evidence of optic neuritis. She followed up with neurology who diagnosed her with intractable migraine, as she does have history of occasional migraine with visual aura. She had another MRI of the brain with and without contrast on 01/30/17, which was normal. Over the course of a month, the head pressure and visual disturbance resolved.  She subsequently developed fatigue. She reported insomnia and diarrhea. She has developed spasms and twitching in her legs and torso. She also developed cramps in the calves. Symptoms mostly are noticeable at night. She also reports paresthesias in the fingers and toes. She denies weakness.  Repeat EBV panel from 02/16/17 demonstrated negative EBV IgM and positive EBV IgG, suggesting prior but not acute infection. Other  labs were also unremarkable, including CBC, ANA, CMP, folate, B12 (596), RMSF IgG, and Lyme IgG/IgM.  She underwent NCV-EMG on 04/06/17, which was normal. To evaluate muscle cramps, CK and Mg were checked on 04/03/17, which were normal. She reports the ear pressure and tinnitus has improved and that the cramps have become less intense. However, she still has muscle twitches and involuntary movements. Sometimes, she reports that a certain part of a limb will swell. She also reported back and neck pain.Repeat eye exam was unremarkable.  Symptoms did resolve but restarted in January 2021.  At that time, she developed pain and tightness in the right side of her jaw.  She took prednisone and was treated for TMJ dysfunction.  She had her first COVID vaccine in late February.  Afterwards, she developed another case of shingles with vesicular rash in her right ear.  She developed her previous symptoms.  She developed right sided ear pain, right sided facial numbness and twitching, numbness of her tongue, right sided neck stiffness, blurred vision, twitching in back of her head, trouble closing both eyes, myalgias and fasciculations in the legs as well as polyarthralgias.  If she grasps an object, sometimes she will feel a shooting pain radiating up her arm.  She also developed hair loss.  She received a course of prednisone and valacyclovir.  Symptoms resolved but developed another case of shingles again after her second vaccine in May.  It subsequently resolved but symptoms have since returned in early July with no known trigger and this time without a rash.     For excessive daytime somnolence and possible narcolepsy, she underwent a sleep study which was unremarkable.    She has also seen Sports Medicine for myalgias and muscle spasms.  Again, labs from April 2021 were normal, including BC, CMP, CK, sed rate, TSH and Mg.   PAST MEDICAL HISTORY: Past Medical History:  Diagnosis Date  . Anxiety   .  Bell's palsy   . OCD (obsessive compulsive disorder)   . Thalassanemia 2021    MEDICATIONS: Current Outpatient Medications on File Prior to Visit  Medication Sig Dispense Refill  . ALPRAZolam (XANAX) 0.25 MG tablet Take 1 tablet (0.25 mg total) by mouth 2 (two) times daily as needed for anxiety. (Patient not taking: Reported on 04/09/2020) 30 tablet 1  . baclofen (LIORESAL) 10 MG tablet Take 1-2 tablets (10-20 mg total) by mouth at bedtime as needed for muscle spasms. (Patient not taking: Reported on 04/09/2020) 30 each 1  . cyanocobalamin 100 MCG tablet Take 100 mcg by mouth daily. (Patient not taking: Reported on 04/28/2020)    . zaleplon (SONATA) 10 MG capsule 1 po qhs prn, and may repeat 1 for midnocturnal awakening, as long as she has 3 hours left to sleep. (Patient not taking: Reported on 04/14/2020) 60 capsule 0   No current facility-administered medications on file prior to visit.    ALLERGIES: Allergies  Allergen Reactions  . Other Anaphylaxis    Coke and red mt. Dew, Throat closes up    FAMILY HISTORY: Family History  Problem Relation Age of Onset  . Thyroid disease Mother   . Kidney cancer Mother   . Asthma Father   . Irritable bowel syndrome Father   . Heart disease Sister   . Diabetes Mellitus II Maternal Grandmother   . Mental illness Maternal Grandfather   .  Glaucoma Paternal Grandfather   . Celiac disease Sister   . Celiac disease Son        tests high for it, not 100% sure he has it  . Breast cancer Maternal Aunt   . Colon cancer Neg Hx   . Throat cancer Neg Hx   . Rectal cancer Neg Hx     SOCIAL HISTORY: Social History   Socioeconomic History  . Marital status: Married    Spouse name: Not on file  . Number of children: 3  . Years of education: college  . Highest education level: Bachelor's degree (e.g., BA, AB, BS)  Occupational History  . 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 Smoking status: Never Smoker  . Smokeless tobacco: Never Used  Vaping Use    . Vaping Use: Never used  Substance and Sexual Activity  . Alcohol use: No  . Drug use: No  . Sexual activity: Yes    Partners: Male  Other Topics Concern  . Not on file  Social History Narrative   Lives at home with husband and three children.   Right-handed.   No daily caffeine use.   Stay-at-home mother.   Fun: chase kiddos, reading, hiking   Social Determinants of Health   Financial Resource Strain:   . Difficulty of Paying Living Expenses: Not on file  Food Insecurity:   . Worried About Programme researcher, broadcasting/film/video in the Last Year: Not on file  . Ran Out of Food in the Last Year: Not on file  Transportation Needs:   . Lack of Transportation (Medical): Not on file  . Lack of Transportation (Non-Medical): Not on file  Physical Activity:   . Days of Exercise per Week: Not on file  . Minutes of Exercise per Session: Not on file  Stress:   . Feeling of Stress : Not on file  Social Connections:   . Frequency of Communication with Friends and Family: Not on file  . Frequency of Social Gatherings with Friends and Family: Not on file  . Attends Religious Services: Not on file  . Active Member of Clubs or Organizations: Not on file  . Attends Banker Meetings: Not on file  . Marital Status: Not on file  Intimate Partner Violence:   . Fear of Current or Ex-Partner: Not on file  . Emotionally Abused: Not on file  . Physically Abused: Not on file  . Sexually Abused: Not on file     PHYSICAL EXAM: Blood pressure 115/81, pulse 86, height 5\' 2"  (1.575 m), weight 153 lb (69.4 kg), SpO2 99 %. General: No acute distress.  Patient appears well-groomed.   Head:  Normocephalic/atraumatic Eyes:  Fundi examined but not visualized Neck: supple, no paraspinal tenderness, full range of motion Heart:  Regular rate and rhythm Lungs:  Clear to auscultation bilaterally Back: No paraspinal tenderness Neurological Exam: alert and oriented to person, place, and time. Attention span  and concentration intact, recent and remote memory intact, fund of knowledge intact.  Speech fluent and not dysarthric, language intact.  CN II-XII intact. Bulk and tone normal, muscle strength 5/5 throughout.  Sensation to light touch, temperature and vibration intact.  Deep tendon reflexes 2+ throughout, toes downgoing.  Finger to nose and heel to shin testing intact.  Gait normal, Romberg negative.  IMPRESSION: Acute illness, recurrent History of Ramsay Hunt syndrome  Unclear what the etiology of her recurrent episodic symptoms.  CNS etiology via MRI and lumbar puncture have been negative.  Current  symptoms have been improving.  At this point, I have no further recommendations.  Follow up as needed.  Shon MilletAdam Zack Crager, DO  CC: Jarold MottoSamantha Worley, PA

## 2020-06-24 DIAGNOSIS — Z713 Dietary counseling and surveillance: Secondary | ICD-10-CM | POA: Diagnosis not present

## 2020-06-25 ENCOUNTER — Other Ambulatory Visit: Payer: Self-pay

## 2020-06-25 ENCOUNTER — Encounter: Payer: Self-pay | Admitting: Neurology

## 2020-06-25 ENCOUNTER — Encounter: Payer: BC Managed Care – PPO | Admitting: Podiatry

## 2020-06-25 ENCOUNTER — Ambulatory Visit (INDEPENDENT_AMBULATORY_CARE_PROVIDER_SITE_OTHER): Payer: BC Managed Care – PPO | Admitting: Neurology

## 2020-06-25 VITALS — BP 115/81 | HR 86 | Ht 62.0 in | Wt 153.0 lb

## 2020-06-25 DIAGNOSIS — G933 Postviral fatigue syndrome: Secondary | ICD-10-CM

## 2020-06-25 DIAGNOSIS — G9331 Postviral fatigue syndrome: Secondary | ICD-10-CM

## 2020-06-25 NOTE — Patient Instructions (Signed)
Follow up as needed

## 2020-06-26 ENCOUNTER — Telehealth: Payer: Self-pay

## 2020-06-26 ENCOUNTER — Ambulatory Visit (INDEPENDENT_AMBULATORY_CARE_PROVIDER_SITE_OTHER): Payer: BC Managed Care – PPO | Admitting: Psychiatry

## 2020-06-26 DIAGNOSIS — F422 Mixed obsessional thoughts and acts: Secondary | ICD-10-CM

## 2020-06-26 NOTE — Progress Notes (Signed)
[facility] Counselor/Therapist Progress Note  Patient ID: Yolanda Kelly, MRN: [id number],    Date: 06/26/2020  Time Spent: 50 minutes start time 10:05 AM end time 10:55 AM Virtual Visit via Telephone Note Connected with patient by a video enabled telemedicine/telehealth application, with their informed consent, and verified patient privacy and that I am speaking with the correct person using two identifiers. I discussed the limitations, risks, security and privacy concerns of performing psychotherapy and management service by telephone and the availability of in person appointments. I also discussed with the patient that there may be a patient responsible charge related to this service. The patient expressed understanding and agreed to proceed. I discussed the treatment planning with the patient. The patient was provided an opportunity to ask questions and all were answered. The patient agreed with the plan and demonstrated an understanding of the instructions. The patient was advised to call  our office if  symptoms worsen or feel they are in a crisis state and need immediate contact.   Therapist Location: office Patient Location: home   Treatment Type: Individual Therapy  Reported Symptoms: anxiety, guilt, obsessive thinking, headaches,fatigue   Mental Status Exam:  Appearance:   Casual and Neat     Behavior:  Appropriate  Motor:  Normal  Speech/Language:   Normal Rate  Affect:  Appropriate  Mood:  anxious  Thought process:  normal  Thought content:    WNL  Sensory/Perceptual disturbances:    WNL  Orientation:  oriented to person, place, time/date and situation  Attention:  Good  Concentration:  Good  Memory:  WNL  Fund of knowledge:   Good  Insight:    Good  Judgment:   Good  Impulse Control:  Good   Risk Assessment: Danger to Self:  No Self-injurious Behavior: No Danger to Others: No Duty to Warn:no Physical Aggression / Violence:No  Access to Firearms a  concern: No  Gang Involvement:No   Subjective: Met with patient via virtual.  She reported that she is doing better since last session. She shared that the house has sold and that has helped.  She shared that she is able to focus on packing and that is helping her channel her obsessive behavior in a good way.  She shared that she is having some issues with her husband who is having some issues with depression.  She shared that she and the kids are feeling the impact of his mood issues.  She reported she isn;t able to talk about her sickness with him because he gets overwhelmed and that is difficult for her and she is feeling alone.  Patient was allowed time to discuss the situation and the impact it is having on she and the children.  She was encouraged to recognize the fact that it is not her job to try and overcompensate for what is going on with her husband.  The importance of talking to him about the situation and his impact on them was addressed with patient.  Different ways that she can communicate with him and the children about the situation were discussed.  Patient was also encouraged to continue making sure she is getting her needs as well as the kids needs met during this very difficult time.  Patient reported feeling better at the end of session.  She shared she felt good about the plans to communicate with her husband and her children about the situation.  She was also able to realize that things are not personal  and that she does not have to over function in the situation.  She was also able to identify some of her triggers so that she could communicate them with her husband.  Interventions: Cognitive Behavioral Therapy and Solution-Oriented/Positive Psychology  Diagnosis:   ICD-10-CM   1. Mixed obsessional thoughts and acts  F42.2     Plan: Patient is to use CBT and coping skills to decrease anxiety symptoms.  Patient is to follow plans to communicate with husband and children that ways to  handle their current situation.  Patient is to remind herself that she only has to take care of of her responsibilities.  Patient is to continue taking medication as directed and following up with physicians concerning health issues. Long-term goal: Enhance ability to handle effectively the full variety of life's anxieties Short-term goal: Identify an anxiety coping mechanism that has been successful in the past and increase its use  Lina Sayre, [facility]

## 2020-06-26 NOTE — Telephone Encounter (Signed)
Yolanda Kelly called to reschedule her surgery from 07/31/2020 to 08/28/2020. She stated she will not have child care on 07/31/2020. Left message for [name] at [facility] of reschedule.

## 2020-06-29 ENCOUNTER — Ambulatory Visit: Payer: BC Managed Care – PPO | Admitting: Physician Assistant

## 2020-07-07 ENCOUNTER — Encounter: Payer: BC Managed Care – PPO | Admitting: Podiatry

## 2020-07-16 ENCOUNTER — Ambulatory Visit: Payer: BC Managed Care – PPO | Admitting: Psychiatry

## 2020-07-20 DIAGNOSIS — D563 Thalassemia minor: Secondary | ICD-10-CM | POA: Diagnosis not present

## 2020-07-20 DIAGNOSIS — D539 Nutritional anemia, unspecified: Secondary | ICD-10-CM | POA: Diagnosis not present

## 2020-07-21 ENCOUNTER — Encounter: Payer: BC Managed Care – PPO | Admitting: Podiatry

## 2020-07-23 DIAGNOSIS — M9901 Segmental and somatic dysfunction of cervical region: Secondary | ICD-10-CM | POA: Diagnosis not present

## 2020-07-23 DIAGNOSIS — R201 Hypoesthesia of skin: Secondary | ICD-10-CM | POA: Diagnosis not present

## 2020-07-23 DIAGNOSIS — G44209 Tension-type headache, unspecified, not intractable: Secondary | ICD-10-CM | POA: Diagnosis not present

## 2020-07-23 DIAGNOSIS — R253 Fasciculation: Secondary | ICD-10-CM | POA: Diagnosis not present

## 2020-08-04 ENCOUNTER — Encounter: Payer: BC Managed Care – PPO | Admitting: Podiatry

## 2020-08-11 ENCOUNTER — Encounter: Payer: BC Managed Care – PPO | Admitting: Podiatry

## 2020-08-12 ENCOUNTER — Encounter: Payer: Self-pay | Admitting: Physician Assistant

## 2020-08-12 ENCOUNTER — Other Ambulatory Visit: Payer: Self-pay

## 2020-08-12 ENCOUNTER — Ambulatory Visit (INDEPENDENT_AMBULATORY_CARE_PROVIDER_SITE_OTHER): Payer: BC Managed Care – PPO | Admitting: Physician Assistant

## 2020-08-12 VITALS — BP 118/72 | HR 72 | Temp 98.2°F | Ht 62.0 in | Wt 151.0 lb

## 2020-08-12 DIAGNOSIS — B0221 Postherpetic geniculate ganglionitis: Secondary | ICD-10-CM | POA: Diagnosis not present

## 2020-08-12 MED ORDER — VALACYCLOVIR HCL 1 G PO TABS: 1000.0000 mg | ORAL_TABLET | Freq: Three times a day (TID) | ORAL | 3 refills | Status: AC

## 2020-08-12 NOTE — Patient Instructions (Signed)
It was great to see you!  Valtrex sent with refills.  Have a great holiday!  Take care,  [name] PA-C

## 2020-08-12 NOTE — Progress Notes (Signed)
Yolanda Kelly is a 34 y.o. female here for a follow up of a pre-existing problem.  History of Present Illness:   Chief Complaint  Patient presents with  . 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 Everlena Cooper with Neurology and Dr. Orvan Falconer with ID. It remains unclear at this time what her true diagnosis is, as Dr. Orvan Falconer recommends biopsy of area if/when recurs to confirm diagnosis. She is planning to have a COVID booster soon. She is wondering if she can have a prophylaxis rx for Valtrex prior to immunizations and in circumstances where she is having onset of symptoms.   Past Medical History:  Diagnosis Date  . Anxiety   . Bell's palsy   . OCD (obsessive compulsive disorder)   . Thalassanemia 2021     Social History   Tobacco Use  . Smoking status: Never Smoker  . Smokeless tobacco: Never Used  Vaping Use  . Vaping Use: Never used  Substance Use Topics  . Alcohol use: No  . Drug use: No    Past Surgical History:  Procedure Laterality Date  . BUNIONECTOMY Right     Family History  Problem Relation Age of Onset  . Thyroid disease Mother   . Kidney cancer Mother   . Asthma Father   . Irritable bowel syndrome Father   . Heart disease Sister   . Diabetes Mellitus II Maternal Grandmother   . Mental illness Maternal Grandfather   . Glaucoma Paternal Grandfather   . Celiac disease Sister   . Celiac disease Son        tests high for it, not 100% sure he has it  . Breast cancer Maternal Aunt   . Colon cancer Neg Hx   . Throat cancer Neg Hx   . Rectal cancer Neg Hx     Allergies  Allergen Reactions  . Other Anaphylaxis    Coke and red mt. Dew, Throat closes up    Current Medications:   Current Outpatient Medications:  .  ALPRAZolam (XANAX) 0.25 MG tablet, Take 1 tablet (0.25 mg total) by mouth 2 (two) times daily as needed for anxiety., Disp: 30 tablet, Rfl: 1 .   valACYclovir (VALTREX) 1000 MG tablet, Take 1 tablet (1,000 mg total) by mouth 3 (three) times daily for 7 days., Disp: 21 tablet, Rfl: 3   Review of Systems:   ROS  Negative unless otherwise specified per HPI.  Vitals:   Vitals:   08/12/20 1103  BP: 118/72  Pulse: 72  Temp: 98.2 F (36.8 C)  TempSrc: Temporal  SpO2: 98%  Weight: 151 lb (68.5 kg)  Height: 5\' 2"  (1.575 m)     Body mass index is 27.62 kg/m.  Physical Exam:   Physical Exam Vitals and nursing note reviewed.  Constitutional:      General: She is not in acute distress.    Appearance: She is well-developed. She is not ill-appearing or toxic-appearing.  Cardiovascular:     Rate and Rhythm: Normal rate and regular rhythm.     Pulses: Normal pulses.     Heart sounds: Normal heart sounds, S1 normal and S2 normal.     Comments: No LE edema Pulmonary:     Effort: Pulmonary effort is normal.     Breath sounds: Normal breath sounds.  Skin:    General: Skin is warm and dry.  Neurological:     Mental Status: She  is alert.     GCS: GCS eye subscore is 4. GCS verbal subscore is 5. GCS motor subscore is 6.  Psychiatric:        Speech: Speech normal.        Behavior: Behavior normal. Behavior is cooperative.     Assessment and Plan:   Emberli was seen today for follow-up.  Diagnoses and all orders for this visit:  Ramsay Hunt auricular syndrome She is requesting valtrex prn for future flares. She understands that if she takes this medication and it prevents development of lesions that we will not be able to obtain biopsies to determine exact cause of symptoms. I provided patient with valtrex to use. Follow-up as needed.  Other orders -     valACYclovir (VALTREX) 1000 MG tablet; Take 1 tablet (1,000 mg total) by mouth 3 (three) times daily for 7 days.   [name], PA-C

## 2020-08-13 DIAGNOSIS — R201 Hypoesthesia of skin: Secondary | ICD-10-CM | POA: Diagnosis not present

## 2020-08-13 DIAGNOSIS — R253 Fasciculation: Secondary | ICD-10-CM | POA: Diagnosis not present

## 2020-08-13 DIAGNOSIS — M9901 Segmental and somatic dysfunction of cervical region: Secondary | ICD-10-CM | POA: Diagnosis not present

## 2020-08-13 DIAGNOSIS — G44209 Tension-type headache, unspecified, not intractable: Secondary | ICD-10-CM | POA: Diagnosis not present

## 2020-08-18 ENCOUNTER — Encounter: Payer: BC Managed Care – PPO | Admitting: Podiatry

## 2020-08-20 ENCOUNTER — Ambulatory Visit (INDEPENDENT_AMBULATORY_CARE_PROVIDER_SITE_OTHER): Payer: BC Managed Care – PPO | Admitting: Psychiatry

## 2020-08-20 DIAGNOSIS — F422 Mixed obsessional thoughts and acts: Secondary | ICD-10-CM

## 2020-08-20 NOTE — Progress Notes (Signed)
[facility] Counselor/Therapist Progress Note  Patient ID: Yolanda Kelly, MRN: [id number],    Date: 08/20/2020  Time Spent: 21 minutes start time 11:08 AM end time 11:29 AM Virtual Visit via Telephone Note Connected with patient by a video enabled telemedicine/telehealth application or telephone, with their informed consent, and verified patient privacy and that I am speaking with the correct person using two identifiers. I discussed the limitations, risks, security and privacy concerns of performing psychotherapy and management service by telephone and the availability of in person appointments. I also discussed with the patient that there may be a patient responsible charge related to this service. The patient expressed understanding and agreed to proceed. I discussed the treatment planning with the patient. The patient was provided an opportunity to ask questions and all were answered. The patient agreed with the plan and demonstrated an understanding of the instructions. The patient was advised to call  our office if  symptoms worsen or feel they are in a crisis state and need immediate contact.   Therapist Location: office Patient Location: home    Treatment Type: Individual Therapy  Reported Symptoms: anxiety, obsessive thinking,   Mental Status Exam:  Appearance:   NA     Behavior:  Sharing  Motor:  na  Speech/Language:   Normal Rate  Affect:  NA  Mood:  anxious  Thought process:  normal  Thought content:    WNL  Sensory/Perceptual disturbances:    WNL  Orientation:  oriented to person, place, time/date and situation  Attention:  Good  Concentration:  Good  Memory:  WNL  Fund of knowledge:   Good  Insight:    Good  Judgment:   Good  Impulse Control:  Good   Risk Assessment: Danger to Self:  No Self-injurious Behavior: No Danger to Others: No Duty to Warn:no Physical Aggression / Violence:No  Access to Firearms a concern: No  Gang Involvement:No    Subjective: Met with patient via phone due her being sick. She shared that it has been hard for her with the COVID variants creating lots of anxiety and obsessive thoughts.  She explained she is working hard on tools from previous sessions to help her get through it and that has been positive.  Patient went on to share that she is hopeful things will get back to normal soon but in today's session she just was not willing to take her risk with her having some sniffles and knowing things are going around again.  She explained that her oldest son is having issues due to being bullying in school and they are trying to figure out how to handle things with him.  Patient reported she is trying to teach him some of the tools that she uses that are helpful.  They are also having family meetings to try and help everyone prepare for things they are going to face as they age and grow into adulthood.  Patient was encouraged to feel positive about all she is doing and to recognize that middle school is a hard time and all she can do is the best she can do.  She shared that she is trying not to take on things that aren't hers.  Session was interrupted by her husband who shared that the school had just called and stated one of their children had been in contact with another student who had tested positive for COVID so they needed to come and get their child immediately.  Interventions: Solution-Oriented/Positive Psychology  Diagnosis:   ICD-10-CM   1. Mixed obsessional thoughts and acts  F42.2     Plan: Patient is to use CBT and coping skills to continue managing anxiety symptoms.  Patient is to continue working with her son on tools to help with the bullying. Long-term goal: Enhance ability to handle effectively at full variety of life's anxieties Short-term goal: Identify an anxiety coping mechanism that has been successful in the past and increase its use  Lina Sayre, [facility]

## 2020-08-25 DIAGNOSIS — D649 Anemia, unspecified: Secondary | ICD-10-CM | POA: Diagnosis not present

## 2020-08-25 DIAGNOSIS — Z6827 Body mass index (BMI) 27.0-27.9, adult: Secondary | ICD-10-CM | POA: Diagnosis not present

## 2020-08-25 DIAGNOSIS — Z01419 Encounter for gynecological examination (general) (routine) without abnormal findings: Secondary | ICD-10-CM | POA: Diagnosis not present

## 2020-08-25 DIAGNOSIS — L659 Nonscarring hair loss, unspecified: Secondary | ICD-10-CM | POA: Diagnosis not present

## 2020-08-25 LAB — RESULTS CONSOLE HPV: CHL HPV: NEGATIVE

## 2020-08-25 LAB — CBC AND DIFFERENTIAL
HCT: 39 (ref 36–46)
Hemoglobin: 12 (ref 12.0–16.0)
Neutrophils Absolute: 6.4
Platelets: 368 (ref 150–399)
WBC: 9.4

## 2020-08-25 LAB — HM PAP SMEAR

## 2020-08-25 LAB — CBC: RBC: 5.38 — AB (ref 3.87–5.11)

## 2020-08-27 ENCOUNTER — Other Ambulatory Visit: Payer: Self-pay | Admitting: Podiatry

## 2020-08-27 MED ORDER — ONDANSETRON HCL 4 MG PO TABS: 4.0000 mg | ORAL_TABLET | Freq: Three times a day (TID) | ORAL | 0 refills | Status: DC | PRN

## 2020-08-27 MED ORDER — HYDROCODONE-ACETAMINOPHEN 10-325 MG PO TABS
1.0000 | ORAL_TABLET | Freq: Four times a day (QID) | ORAL | 0 refills | Status: AC | PRN
Start: 2020-08-27 — End: 2020-09-03

## 2020-08-27 MED ORDER — CEPHALEXIN 500 MG PO CAPS: 500.0000 mg | ORAL_CAPSULE | Freq: Three times a day (TID) | ORAL | 0 refills | Status: DC

## 2020-08-28 DIAGNOSIS — Z4889 Encounter for other specified surgical aftercare: Secondary | ICD-10-CM | POA: Diagnosis not present

## 2020-08-28 DIAGNOSIS — T8484XA Pain due to internal orthopedic prosthetic devices, implants and grafts, initial encounter: Secondary | ICD-10-CM | POA: Diagnosis not present

## 2020-08-28 DIAGNOSIS — M25571 Pain in right ankle and joints of right foot: Secondary | ICD-10-CM | POA: Diagnosis not present

## 2020-09-01 ENCOUNTER — Encounter: Payer: BC Managed Care – PPO | Admitting: Podiatry

## 2020-09-03 ENCOUNTER — Ambulatory Visit (INDEPENDENT_AMBULATORY_CARE_PROVIDER_SITE_OTHER): Payer: BC Managed Care – PPO

## 2020-09-03 ENCOUNTER — Ambulatory Visit (INDEPENDENT_AMBULATORY_CARE_PROVIDER_SITE_OTHER): Payer: BC Managed Care – PPO | Admitting: Podiatry

## 2020-09-03 ENCOUNTER — Other Ambulatory Visit: Payer: Self-pay

## 2020-09-03 ENCOUNTER — Encounter: Payer: Self-pay | Admitting: Podiatry

## 2020-09-03 DIAGNOSIS — T847XXD Infection and inflammatory reaction due to other internal orthopedic prosthetic devices, implants and grafts, subsequent encounter: Secondary | ICD-10-CM

## 2020-09-03 DIAGNOSIS — Z9889 Other specified postprocedural states: Secondary | ICD-10-CM

## 2020-09-03 NOTE — Progress Notes (Signed)
Thank you okay she presents today for follow-up of her screw removal first metatarsal right foot.  She denies fever chills nausea vomit muscle aches pains states that has not bothered her at all.  Objective: Vital signs are stable she is alert and oriented.  Rest of dressing intact was removed demonstrates 3 sutures to a 1 cm long incision.  No erythema edema cellulitis drainage or odor radiographs taken today demonstrate no screw in the first metatarsal completely removed.  Plan: I am going to place a Band-Aid and allow her to start to get this wet.  I will follow-up with her in a week or so just to remove the sutures.

## 2020-09-10 ENCOUNTER — Encounter: Payer: Self-pay | Admitting: Podiatry

## 2020-09-10 ENCOUNTER — Other Ambulatory Visit: Payer: Self-pay

## 2020-09-10 ENCOUNTER — Ambulatory Visit (INDEPENDENT_AMBULATORY_CARE_PROVIDER_SITE_OTHER): Payer: BC Managed Care – PPO | Admitting: Podiatry

## 2020-09-10 DIAGNOSIS — Z9889 Other specified postprocedural states: Secondary | ICD-10-CM

## 2020-09-10 DIAGNOSIS — T847XXD Infection and inflammatory reaction due to other internal orthopedic prosthetic devices, implants and grafts, subsequent encounter: Secondary | ICD-10-CM

## 2020-09-10 NOTE — Progress Notes (Signed)
She presents today date of surgery 08/28/2020 status post removal screw first metatarsal right foot.  Denies fever chills nausea vomiting states that is doing just fine.  Objective: Sutures are intact x3 small incision dorsal medial aspect of the first metatarsal right foot.  No pain on palpation.  No dehiscence.  Sutures were removed.  Assessment: Well-healing surgical foot.  Plan: Follow-up with me on an as-needed basis.  Sutures were removed today margins remain well coapted

## 2020-09-15 ENCOUNTER — Encounter: Payer: BC Managed Care – PPO | Admitting: Podiatry

## 2020-09-20 ENCOUNTER — Encounter: Payer: Self-pay | Admitting: Physician Assistant

## 2020-09-23 ENCOUNTER — Other Ambulatory Visit: Payer: Self-pay

## 2020-09-23 ENCOUNTER — Encounter: Payer: Self-pay | Admitting: Physician Assistant

## 2020-09-23 ENCOUNTER — Ambulatory Visit (INDEPENDENT_AMBULATORY_CARE_PROVIDER_SITE_OTHER): Payer: BC Managed Care – PPO | Admitting: Physician Assistant

## 2020-09-23 DIAGNOSIS — F411 Generalized anxiety disorder: Secondary | ICD-10-CM | POA: Diagnosis not present

## 2020-09-23 DIAGNOSIS — G4700 Insomnia, unspecified: Secondary | ICD-10-CM | POA: Diagnosis not present

## 2020-09-23 DIAGNOSIS — F422 Mixed obsessional thoughts and acts: Secondary | ICD-10-CM

## 2020-09-23 MED ORDER — ALPRAZOLAM 0.25 MG PO TABS
0.2500 mg | ORAL_TABLET | Freq: Two times a day (BID) | ORAL | 1 refills | Status: DC | PRN
Start: 2020-09-23 — End: 2023-05-23

## 2020-09-23 MED ORDER — TRAZODONE HCL 50 MG PO TABS: 25.0000 mg | ORAL_TABLET | Freq: Every day | ORAL | 1 refills | Status: DC

## 2020-09-23 NOTE — Progress Notes (Signed)
[facility] Med Check  Patient ID: Yolanda Kelly,  MRN: [id number]  PCP: [name], PA  Date of Evaluation: 09/23/2020 Time spent:20 minutes  Chief Complaint:  Chief Complaint    Anxiety      HISTORY/CURRENT STATUS: HPI  For routine med check.  Only problem is staying asleep. Falls asleep ok most of the time but she does sometimes need the Xanax to help her relax to go to sleep.  Doesn't want to go back on Sonata, it helped initially but then it quit working. She doesn't like to take meds anyway.  Not needing the Xanax often for the anxiety/OCD symptoms.  It is helpful when she does need it.  She has taken 30 pills or less in the past 6 months.  She is able to enjoy things.  She sleeps well.  Energy and motivation are good.  Not crying easily.  Denies forgetfulness.  No suicidal or homicidal thoughts.  Denies dizziness, syncope, seizures, numbness, tingling, tremor, tics, unsteady gait, slurred speech, confusion. Denies muscle or joint pain, stiffness, or dystonia.  Individual Medical History/ Review of Systems: Changes? :No    Past medications for mental health diagnoses include: Xanax, Unisom, Sonata worked for a while but then it quit.  Buspar caused h/a and nausea  Allergies: Other  Current Medications:  Current Outpatient Medications:  .  traZODone (DESYREL) 50 MG tablet, Take 0.5-2 tablets (25-100 mg total) by mouth at bedtime., Disp: 60 tablet, Rfl: 1 .  ALPRAZolam (XANAX) 0.25 MG tablet, Take 1 tablet (0.25 mg total) by mouth 2 (two) times daily as needed for anxiety., Disp: 30 tablet, Rfl: 1 Medication Side Effects: none  Family Medical/ Social History: Changes?  No  MENTAL HEALTH EXAM:  There were no vitals taken for this visit.There is no height or weight on file to calculate BMI.  General Appearance: Casual, Neat and Well Groomed  Eye Contact:  Good  Speech:  Clear and Coherent  Volume:  Normal  Mood:  Euthymic  Affect:  Appropriate  Thought  Process:  Goal Directed and Descriptions of Associations: Intact  Orientation:  Full (Time, Place, and Person)  Thought Content: Logical   Suicidal Thoughts:  No  Homicidal Thoughts:  No  Memory:  WNL  Judgement:  Good  Insight:  Good  Psychomotor Activity:  Normal  Concentration:  Concentration: Good and Attention Span: Good  Recall:  Good  Fund of Knowledge: Good  Language: Good  Assets:  Desire for Improvement  ADL's:  Intact  Cognition: WNL  Prognosis:  Good    DIAGNOSES:    ICD-10-CM   1. Mixed obsessional thoughts and acts  F42.2   2. Generalized anxiety disorder  F41.1   3. Insomnia, unspecified type  G47.00     Receiving Psychotherapy: No In the past with Stevphen Meuse, [facility]   RECOMMENDATIONS:  PDMP reviewed. I provided 20 minutes of face-to-face time during this encounter, in which we discussed insomnia.  Sleep hygiene was discussed.  I recommend adding trazodone.  We discussed benefits, risks, side effects and she accepts Start trazodone 50 mg, 1/2 to 2 pills nightly as needed sleep. Continue Xanax 0.25 mg, 1 p.o. twice daily as needed.  She takes very rarely. If she is doing well in 2 months as far as the sleep issue goes she can cancel the 51-month appointment.  If she does need to come in will have an appointment on the books.  Of course she can call at any time if she needs  anything prior to any appointment. Return in 2 months and 6 months.  [name], PA-C

## 2020-09-24 ENCOUNTER — Telehealth: Payer: Self-pay | Admitting: Podiatry

## 2020-09-24 NOTE — Telephone Encounter (Signed)
Patient wants to know if she should come in for POV since she is doing better, Please Advise

## 2020-09-25 NOTE — Telephone Encounter (Signed)
Addressed patient message through MyChart

## 2020-09-29 ENCOUNTER — Encounter: Payer: BC Managed Care – PPO | Admitting: Podiatry

## 2020-10-13 ENCOUNTER — Encounter: Payer: BC Managed Care – PPO | Admitting: Podiatry

## 2020-10-21 ENCOUNTER — Other Ambulatory Visit: Payer: Self-pay | Admitting: Physician Assistant

## 2020-11-19 ENCOUNTER — Telehealth (INDEPENDENT_AMBULATORY_CARE_PROVIDER_SITE_OTHER): Payer: BC Managed Care – PPO | Admitting: Physician Assistant

## 2020-11-19 DIAGNOSIS — G4700 Insomnia, unspecified: Secondary | ICD-10-CM | POA: Diagnosis not present

## 2020-11-19 DIAGNOSIS — F411 Generalized anxiety disorder: Secondary | ICD-10-CM | POA: Diagnosis not present

## 2020-11-19 DIAGNOSIS — F422 Mixed obsessional thoughts and acts: Secondary | ICD-10-CM

## 2020-11-19 MED ORDER — ZALEPLON 10 MG PO CAPS: ORAL_CAPSULE | ORAL | 0 refills | Status: DC

## 2020-11-19 NOTE — Progress Notes (Signed)
[facility]  Patient ID: Yolanda Kelly,  MRN: [id number]  PCP: [name], PA  Date of Evaluation: 11/19/2020 Time spent:20 minutes  Chief Complaint:  Chief Complaint    Anxiety; Insomnia     Virtual Visit via Telehealth  I connected with patient by a video enabled telemedicine application with their informed consent, and verified patient privacy and that I am speaking with the correct person using two identifiers.  I am private, in my office and the patient is at home.  I discussed the limitations, risks, security and privacy concerns of performing an evaluation and management service by video and the availability of in person appointments. I also discussed with the patient that there may be a patient responsible charge related to this service. The patient expressed understanding and agreed to proceed.   I discussed the assessment and treatment plan with the patient. The patient was provided an opportunity to ask questions and all were answered. The patient agreed with the plan and demonstrated an understanding of the instructions.   The patient was advised to call back or seek an in-person evaluation if the symptoms worsen or if the condition fails to improve as anticipated.  I provided 20 minutes of non-face-to-face time during this encounter.   HISTORY/CURRENT STATUS: HPI  For routine med check.  She is able to enjoy things.  Still has trouble sleeping sometimes. Didn't tolerate Thea Silversmith, groggy the next day.  Kathaleen Bury has worked better than anything and only needs it rarely. Energy and motivation are good.  Not crying easily.  Denies forgetfulness.  No suicidal or homicidal thoughts.  Denies dizziness, syncope, seizures, numbness, tingling, tremor, tics, unsteady gait, slurred speech, confusion. Denies muscle or joint pain, stiffness, or dystonia.  Individual Medical History/ Review of Systems: Changes? :No    Past medications for mental health diagnoses  include: Xanax, Unisom, Sonata worked for a while but then it quit.  Buspar caused h/a and nausea, Trazodone caused nightmares.  Allergies: Other  Current Medications:  Current Outpatient Medications:  .  ALPRAZolam (XANAX) 0.25 MG tablet, Take 1 tablet (0.25 mg total) by mouth 2 (two) times daily as needed for anxiety., Disp: 30 tablet, Rfl: 1 .  zaleplon (SONATA) 10 MG capsule, 1 po nightly as needed sleep.  May repeat 1 for mid nocturnal awakening as needed as long as she has 3 hours left to sleep., Disp: 30 capsule, Rfl: 0 Medication Side Effects: none  Family Medical/ Social History: Changes?  No  MENTAL HEALTH EXAM:  There were no vitals taken for this visit.There is no height or weight on file to calculate BMI.  General Appearance: Casual, Neat and Well Groomed  Eye Contact:  Good  Speech:  Clear and Coherent  Volume:  Normal  Mood:  Euthymic  Affect:  Appropriate  Thought Process:  Goal Directed and Descriptions of Associations: Intact  Orientation:  Full (Time, Place, and Person)  Thought Content: Logical   Suicidal Thoughts:  No  Homicidal Thoughts:  No  Memory:  WNL  Judgement:  Good  Insight:  Good  Psychomotor Activity:  Normal  Concentration:  Concentration: Good and Attention Span: Good  Recall:  Good  Fund of Knowledge: Good  Language: Good  Assets:  Desire for Improvement  ADL's:  Intact  Cognition: WNL  Prognosis:  Good    DIAGNOSES:    ICD-10-CM   1. Insomnia, unspecified type  G47.00   2. Mixed obsessional thoughts and acts  F42.2   3. Generalized anxiety  disorder  F41.1     Receiving Psychotherapy: No In the past with Stevphen Meuse, [facility]   RECOMMENDATIONS:  PDMP reviewed. I provided 20 minutes of non-face-to-face time during this encounter, in which we discussed insomnia, plus time spent before and after the visit and records review and charting.  Sleep hygiene was discussed again. She doesn't like taking meds and uses hesitantly.  Continue  Xanax 0.25mg , 1/2-1 po bid prn. Continue Sonata 10 mg, 1 q has as needed, and may repeat 1 for mid-nocturnal awakening as needed as long as she has at least 3 hours left to sleep.  Return in 6 months.  [name], PA-C

## 2020-11-27 ENCOUNTER — Telehealth: Payer: Self-pay

## 2020-11-27 MED ORDER — ZALEPLON 10 MG PO CAPS: ORAL_CAPSULE | ORAL | 0 refills | Status: DC

## 2020-11-27 NOTE — Telephone Encounter (Signed)
Pt called right back and reports she doesn't even take every night anyway. So no PA probably needed. I will pend #[phone] for Yolanda Kelly to send instead to [facility] [place].

## 2020-11-27 NOTE — Telephone Encounter (Signed)
Prescription was sent

## 2020-11-27 NOTE — Telephone Encounter (Signed)
Prior authorization submitted for #60 ZALEPLON 10 MG CAPS, [employer] will pay for 1/day but not 2. PA was DENIED for 2/day #[id number]   Pt can get 1/day or she can use GoodRx for #60. [facility] $19.73, [facility] $23.31 [facility] $20.46, [facility] $24.98, [facility] $18.28  I left pt a msg to call back to discuss

## 2020-11-29 ENCOUNTER — Encounter: Payer: Self-pay | Admitting: Physician Assistant

## 2021-01-11 DIAGNOSIS — L858 Other specified epidermal thickening: Secondary | ICD-10-CM | POA: Diagnosis not present

## 2021-01-11 DIAGNOSIS — D485 Neoplasm of uncertain behavior of skin: Secondary | ICD-10-CM | POA: Diagnosis not present

## 2021-01-11 DIAGNOSIS — D2271 Melanocytic nevi of right lower limb, including hip: Secondary | ICD-10-CM | POA: Diagnosis not present

## 2021-01-19 ENCOUNTER — Ambulatory Visit (INDEPENDENT_AMBULATORY_CARE_PROVIDER_SITE_OTHER): Payer: BC Managed Care – PPO | Admitting: Psychiatry

## 2021-01-19 ENCOUNTER — Other Ambulatory Visit: Payer: Self-pay

## 2021-01-19 DIAGNOSIS — F422 Mixed obsessional thoughts and acts: Secondary | ICD-10-CM | POA: Diagnosis not present

## 2021-01-19 NOTE — Progress Notes (Signed)
      [facility] Counselor/Therapist Progress Note  Patient ID: Yolanda Kelly, MRN: [id number],    Date: 01/19/2021  Time Spent: 50 minutes start time 11:10 AM end time 12 PM  Treatment Type: Individual Therapy  Reported Symptoms: anxiety, obsessive thoughts, compulsive behaviors, hypervigilance   Mental Status Exam:  Appearance:   Casual and Neat     Behavior:  Appropriate  Motor:  Normal  Speech/Language:   Normal Rate  Affect:  Appropriate  Mood:  anxious  Thought process:  normal  Thought content:    WNL  Sensory/Perceptual disturbances:    WNL  Orientation:  oriented to person, place, time/date and situation  Attention:  Good  Concentration:  Good  Memory:  WNL  Fund of knowledge:   Good  Insight:    Good  Judgment:   Good  Impulse Control:  Good   Risk Assessment: Danger to Self:  No Self-injurious Behavior: No Danger to Others: No Duty to Warn:no Physical Aggression / Violence:No  Access to Firearms a concern: No  Gang Involvement:No   Subjective: Patient was present for session.  She shared that her anxiety has increased since [place].  She explained they found bedbugs in the house they were staying at and they all came down with the stomach bug.  She stated it was all overwhelming for everyone. Her oldest son is having issues with school and she has to talk himself through all of those issues.  Her husband is having his depression issues and she is having to hold things in and have to not deal with what is important to her.  Patient did EMDR set on asking husband to talk and he says "no", felt helpless and hurt and frustration her chest, suds level 10, negative cognition "I am a burden" patient was able to reduce suds level to 5.  She was able to recognize there is been a pattern for most of her life where she felt that she was the issue and had to fix things.  Patient was recognizing that that was not the truth and it is going to be important  for her to say what she needs to say.  Different ways to do that were discussed with patient.  Interventions: Solution-Oriented/Positive Psychology and Eye Movement Desensitization and Reprocessing (EMDR)  Diagnosis:No diagnosis found.  Plan: Patient is to use CBT and coping skills to decrease anxiety symptoms.  Patient is to work on letting her husband know what she needs in a way that is not accusatory.  Patient is to work on her self-care.  Patient is to take medication as directed Long-term goal: Enhance ability to handle effectively at full variety of life's anxieties Short-term goal: Identify an anxiety coping mechanism that has been successful in the past and increase its use  Stevphen Meuse, [facility]

## 2021-02-03 ENCOUNTER — Ambulatory Visit (INDEPENDENT_AMBULATORY_CARE_PROVIDER_SITE_OTHER): Payer: BC Managed Care – PPO | Admitting: Psychiatry

## 2021-02-03 ENCOUNTER — Other Ambulatory Visit: Payer: Self-pay

## 2021-02-03 DIAGNOSIS — F422 Mixed obsessional thoughts and acts: Secondary | ICD-10-CM | POA: Diagnosis not present

## 2021-02-03 NOTE — Progress Notes (Signed)
      [facility] Counselor/Therapist Progress Note  Patient ID: Yolanda Kelly, MRN: [id number],    Date: 02/03/2021  Time Spent: 52 minutes start time 12:06 PM end time 12:58 PM  Treatment Type: Individual Therapy  Reported Symptoms: anxiety, anger, hurt, obsessive thoughts, compulsive behavior  Mental Status Exam:  Appearance:   Casual and Neat     Behavior:  Appropriate  Motor:  Normal  Speech/Language:   Normal Rate  Affect:  Appropriate  Mood:  anxious and sad  Thought process:  normal  Thought content:    WNL  Sensory/Perceptual disturbances:    WNL  Orientation:  oriented to person, place, time/date and situation  Attention:  Good  Concentration:  Good  Memory:  WNL  Fund of knowledge:   Good  Insight:    Good  Judgment:   Good  Impulse Control:  Good   Risk Assessment: Danger to Self:  No Self-injurious Behavior: No Danger to Others: No Duty to Warn:no Physical Aggression / Violence:No  Access to Firearms a concern: No  Gang Involvement:No   Subjective: Patient was present for session.  She shared that she and her husband are having problems with communication.  She shared that he doesn't want her to bring things up to him but than he wants to talk. She is realizing she has been over functioning.  Patient did EMDR set on her screaming in the car, suds level 10, negative cognition "I cannot be okay" felt anger in her chest and her throat.  Patient was able to reduce suds level to 3.  She was able to realize that she needs to take care of herself and at the same time love her husband by setting healthy limits with him.  Patient was able to think of some ways that she can communicate her concerns and frustrations with him without trying to take on his issues.  Patient was also able to realize there is her stuff in his stuff and she has to allow him to have his stuff.  Interventions: Solution-Oriented/Positive Psychology and Eye Movement Desensitization and Reprocessing  (EMDR)  Diagnosis:   ICD-10-CM   1. Mixed obsessional thoughts and acts  F42.2     Plan: Patient is to use CBT and coping skills to decrease anxiety symptoms.  Patient is to set appropriate limits with her husband concerning his emotions.  Patient is to affirm herself regularly that she is enough in its good for her to be okay.  Patient is to take medication as directed. Long-term goal: Enhance ability to handle effectively at full variety of life's anxieties Short-term goal: Identify an anxiety coping mechanism that has been successful in the past and increase its use  Stevphen Meuse, [facility]

## 2021-02-17 DIAGNOSIS — Z630 Problems in relationship with spouse or partner: Secondary | ICD-10-CM | POA: Diagnosis not present

## 2021-02-25 ENCOUNTER — Ambulatory Visit (INDEPENDENT_AMBULATORY_CARE_PROVIDER_SITE_OTHER): Payer: BC Managed Care – PPO | Admitting: Psychiatry

## 2021-02-25 DIAGNOSIS — F422 Mixed obsessional thoughts and acts: Secondary | ICD-10-CM

## 2021-02-25 NOTE — Progress Notes (Addendum)
[facility] Counselor/Therapist Progress Note  Patient ID: Yolanda Kelly, MRN: [id number],    Date: 02/25/2021  Time Spent: 58 minutes start time 9:59 AM end time 10:57 AM Virtual Visit via Telephone Note Connected with patient by a video enabled telemedicine/telehealth application, with their informed consent, and verified patient privacy and that I am speaking with the correct person using two identifiers. I discussed the limitations, risks, security and privacy concerns of performing psychotherapy and management service by telephone and the availability of in person appointments. I also discussed with the patient that there may be a patient responsible charge related to this service. The patient expressed understanding and agreed to proceed. I discussed the treatment planning with the patient. The patient was provided an opportunity to ask questions and all were answered. The patient agreed with the plan and demonstrated an understanding of the instructions. The patient was advised to call  our office if  symptoms worsen or feel they are in a crisis state and need immediate contact.   Therapist Location: office Patient Location: home    Treatment Type: Individual Therapy  Reported Symptoms: anxiety, obsessive thinking, sadness  Mental Status Exam:  Appearance:   Casual     Behavior:  Appropriate  Motor:  Normal  Speech/Language:   Normal Rate  Affect:  Appropriate  Mood:  anxious  Thought process:  normal  Thought content:    WNL  Sensory/Perceptual disturbances:    WNL  Orientation:  oriented to person, place, time/date, and situation  Attention:  Good  Concentration:  Good  Memory:  WNL  Fund of knowledge:   Good  Insight:    Good  Judgment:   Good  Impulse Control:  Good   Risk Assessment: Danger to Self:  No Self-injurious Behavior: No Danger to Others: No Duty to Warn:no Physical Aggression / Violence:No  Access to Firearms a concern: No  Gang Involvement:No    Subjective: Met with patient via virtual session. She shared that they got a puppy and she is struggling with it.  She shared that it is a good thing because she has been able to release some things because of it. It has been helping her have some good conversations with her husband.  Patient shared that she is realized she has a tendency to overcompensate for everybody and hold things in and not say what she needs to help take care of her.  Patient was encouraged to recognize that even when she is taking care of the puppy is important and he needs freedom it is okay to take break from the puppy and relax especially if nobody else is willing to monitor the puppy. Recognizing the puppy will trigger her obsessive thinking and she has to get her needs met so she doesn't have set backs mentally and physically is important.  Also encouraged her to continue having their family hiking and zoo trips even though they have a puppy now the puppy will be fine for them to do those activities so she does feel trapped at home with the puppy.  Patient shared that she and her husband have started meeting with a marital therapist and that was very helpful.  She shared through that she realized that her husband was stuck on some of the past when she was very sick and could not do as much and has not recognize that she is much better and doing a lot more.  Patient also shared that she realized through the session that  it is okay for her to communicate her feelings and she does not need to change who she is just because of his depression.  Encouraged her to feel good about the revelations she is having and to remind herself of those regularly, regardless of what is going on with him she still has to take care of herself.  Patient Was also encouraged to remind herself that she is making good choices and doing good things for herself and her family even though they are hard.   Interventions: Cognitive Behavioral Therapy and  Solution-Oriented/Positive Psychology  Diagnosis:   ICD-10-CM   1. Mixed obsessional thoughts and acts  F42.2       Plan: Patient is to use CBT and coping skills to decrease anxiety symptoms.  Patient is to continue reminding herself that she has to share what is going on with her to release it appropriately rather than holding it in.  Patient is to continue working on limit setting with her family and the puppy.  Patient is to take time for herself when she needs it.  Patient is to take medication as directed. Long-term goal: Enhance ability to handle effectively at full variety of life's anxieties Short-term goal: Identify an anxiety coping mechanism that has been successful in the past and increase its use  Lina Sayre, [facility]

## 2021-03-03 DIAGNOSIS — Z630 Problems in relationship with spouse or partner: Secondary | ICD-10-CM | POA: Diagnosis not present

## 2021-03-17 DIAGNOSIS — Z630 Problems in relationship with spouse or partner: Secondary | ICD-10-CM | POA: Diagnosis not present

## 2021-03-19 ENCOUNTER — Ambulatory Visit: Payer: BC Managed Care – PPO | Admitting: Psychiatry

## 2021-04-06 ENCOUNTER — Ambulatory Visit: Payer: BC Managed Care – PPO | Admitting: Psychiatry

## 2021-04-27 ENCOUNTER — Ambulatory Visit: Payer: BC Managed Care – PPO | Admitting: Psychiatry

## 2021-04-28 DIAGNOSIS — Z630 Problems in relationship with spouse or partner: Secondary | ICD-10-CM | POA: Diagnosis not present

## 2021-05-05 DIAGNOSIS — Z630 Problems in relationship with spouse or partner: Secondary | ICD-10-CM | POA: Diagnosis not present

## 2021-05-14 DIAGNOSIS — Z630 Problems in relationship with spouse or partner: Secondary | ICD-10-CM | POA: Diagnosis not present

## 2021-05-18 ENCOUNTER — Ambulatory Visit (INDEPENDENT_AMBULATORY_CARE_PROVIDER_SITE_OTHER): Payer: BC Managed Care – PPO | Admitting: Psychiatry

## 2021-05-18 ENCOUNTER — Other Ambulatory Visit: Payer: Self-pay

## 2021-05-18 DIAGNOSIS — F422 Mixed obsessional thoughts and acts: Secondary | ICD-10-CM | POA: Diagnosis not present

## 2021-05-18 NOTE — Progress Notes (Signed)
      [facility] Counselor/Therapist Progress Note  Patient ID: Yolanda Kelly, MRN: [id number],    Date: 05/18/2021  Time Spent: 51 minutes start time 10:07 AM end time 10:58 AM  Treatment Type: Individual Therapy  Reported Symptoms: anxiety, sadness, anger, irritability, obsessive thinking  Mental Status Exam:  Appearance:   Casual and Neat     Behavior:  Appropriate  Motor:  Normal  Speech/Language:   Normal Rate  Affect:  Appropriate and Tearful  Mood:  anxious and sad  Thought process:  normal  Thought content:    WNL  Sensory/Perceptual disturbances:    WNL  Orientation:  oriented to person, place, time/date, and situation  Attention:  Good  Concentration:  Good  Memory:  WNL  Fund of knowledge:   Good  Insight:    Good  Judgment:   Good  Impulse Control:  Good   Risk Assessment: Danger to Self:  No Self-injurious Behavior: No Danger to Others: No Duty to Warn:no Physical Aggression / Violence:No  Access to Firearms a concern: No  Gang Involvement:No   Subjective: Patient was present for session.  She shared that she and her husband have been in marital therapy.  The process has been hard for her.  She stated that she is having lots of anger and hurt.  Patient was allowed time to discuss her feelings and frustrations over the situation.  Patient was able to acknowledge she feels that her husband does not appreciate her advice and that he shuts her down.  Patient was able to discuss different situations where she felt that had occurred.  The importance of her trying to advocate for herself was discussed with patient.  Patient was also encouraged to remind herself of the truth that she has done an amazing job of making sure her children feel heard and their emotions are validated so it is important for her to do the same for herself.  Patient was also able to recognize the fact that she has got to focus on her own self-care rather than putting her needs aside to make sure  everybody else's needs are taking care of.  Different ways to do that were discussed with patient and plans were developed in session.  Interventions: Cognitive Behavioral Therapy, Solution-Oriented/Positive Psychology, and Insight-Oriented  Diagnosis:   ICD-10-CM   1. Mixed obsessional thoughts and acts  F42.2       Plan: Patient is to use CBT and coping skills to decrease anxiety symptoms.  Patient is to focus on self-care including exercise to release negative emotions appropriately.  Patient is to continue trying to communicate her needs with her husband appropriately.  Patient will continue in marital therapy.  Patient is to take medication as directed. Long-term goal: Enhance ability to handle effectively at full variety of life's anxieties Short-term goal: Identify an anxiety coping mechanism that has been successful in the past and increase its use   Stevphen Meuse, [facility]

## 2021-05-24 DIAGNOSIS — Z20822 Contact with and (suspected) exposure to covid-19: Secondary | ICD-10-CM | POA: Diagnosis not present

## 2021-05-25 ENCOUNTER — Telehealth (INDEPENDENT_AMBULATORY_CARE_PROVIDER_SITE_OTHER): Payer: BC Managed Care – PPO | Admitting: Family Medicine

## 2021-05-25 ENCOUNTER — Encounter: Payer: Self-pay | Admitting: Family Medicine

## 2021-05-25 VITALS — Temp 99.2°F | Wt 156.0 lb

## 2021-05-25 DIAGNOSIS — R059 Cough, unspecified: Secondary | ICD-10-CM | POA: Diagnosis not present

## 2021-05-25 MED ORDER — ALBUTEROL SULFATE HFA 108 (90 BASE) MCG/ACT IN AERS: 2.0000 | INHALATION_SPRAY | Freq: Four times a day (QID) | RESPIRATORY_TRACT | 0 refills | Status: DC | PRN

## 2021-05-25 MED ORDER — BENZONATATE 100 MG PO CAPS: 100.0000 mg | ORAL_CAPSULE | Freq: Three times a day (TID) | ORAL | 0 refills | Status: DC | PRN

## 2021-05-25 NOTE — Progress Notes (Signed)
Virtual Visit via Video Note  I connected with Yolanda Kelly  on 05/25/21 at  4:40 PM EDT by a video enabled telemedicine application and verified that I am speaking with the correct person using two identifiers.  Location patient: home,  Location provider:work or home office Persons participating in the virtual visit: patient, provider  I discussed the limitations of evaluation and management by telemedicine and the availability of in person appointments. The patient expressed understanding and agreed to proceed.   HPI:  Acute telemedicine visit for possible covid: -Onset: 2 days ago; covid PCR testing pending -daughter is positive with covid -Symptoms include: nasal congestion, watery, cough, watery eyes, diarrhea, a little tightness in the cough - feels a little wheezy, breathing too deep makes her cough -Denies: fevers,  SOB, vomiting, inability to eat/drink/get out of bed -Pertinent past medical history: reports hx of EIB and bronchitis, has required inhaler in the past as an adult -Pertinent medication allergies: Allergies  Allergen Reactions   Other Anaphylaxis    Coke and red [place], Throat closes up  -COVID-19 vaccine status: vaccinated x2 and boosted -denies any chance of pregnancy  ROS: See pertinent positives and negatives per HPI.  Past Medical History:  Diagnosis Date   Anxiety    Bell's palsy    OCD (obsessive compulsive disorder)    Thalassanemia 2021    Past Surgical History:  Procedure Laterality Date   BUNIONECTOMY Right      Current Outpatient Medications:    albuterol (VENTOLIN HFA) 108 (90 Base) MCG/ACT inhaler, Inhale 2 puffs into the lungs every 6 (six) hours as needed for wheezing or shortness of breath., Disp: 8 g, Rfl: 0   ALPRAZolam (XANAX) 0.25 MG tablet, Take 1 tablet (0.25 mg total) by mouth 2 (two) times daily as needed for anxiety., Disp: 30 tablet, Rfl: 1   benzonatate (TESSALON PERLES) 100 MG capsule, Take 1 capsule (100 mg total) by mouth 3  (three) times daily as needed., Disp: 20 capsule, Rfl: 0   Multiple Vitamin (MULTIVITAMIN) tablet, Take 1 tablet by mouth daily., Disp: , Rfl:   EXAM:  VITALS per patient if applicable:  GENERAL: alert, oriented, appears well and in no acute distress  HEENT: atraumatic, conjunttiva clear, no obvious abnormalities on inspection of external nose and ears  NECK: normal movements of the head and neck  LUNGS: on inspection no signs of respiratory distress, breathing rate appears normal, no obvious gross SOB, gasping or wheezing  CV: no obvious cyanosis  MS: moves all visible extremities without noticeable abnormality  PSYCH/NEURO: pleasant and cooperative, no obvious depression or anxiety, speech and thought processing grossly intact  ASSESSMENT AND PLAN:  Discussed the following assessment and plan:  Cough  -we discussed possible serious and likely etiologies, options for evaluation and workup, limitations of telemedicine visit vs in person visit, treatment, treatment risks and precautions. Pt is agreeable to treatment via telemedicine at this moment.  Likely Covid19 vs other. She opted for Tessalon for cough, albuterol q 6 hours prn and other symptomatic care options per patient instructions. Demonstrated proper use of the inhaler. Covid testing is pending.  Work/School slipped offered:  declined Advised to seek prompt in person care if worsening, new symptoms arise, or if is not improving with treatment. Discussed options for inperson care if PCP office not available. Did let this patient know that I only Kelly telemedicine on Tuesdays and Thursdays for [place]. Advised to schedule follow up visit with PCP or [facility] if any further questions  or concerns to avoid delays in care.   I discussed the assessment and treatment plan with the patient. The patient was provided an opportunity to ask questions and all were answered. The patient agreed with the plan and demonstrated an understanding of the  instructions.     Yolanda Kelly

## 2021-05-25 NOTE — Patient Instructions (Addendum)
  HOME CARE TIPS:  -COVID19 testing information: Most pharmacies also offer testing and home test kits.   -I sent the medication(s) we discussed to your pharmacy: Meds ordered this encounter  Medications   albuterol (VENTOLIN HFA) 108 (90 Base) MCG/ACT inhaler    Sig: Inhale 2 puffs into the lungs every 6 (six) hours as needed for wheezing or shortness of breath.    Dispense:  8 g    Refill:  0   benzonatate (TESSALON PERLES) 100 MG capsule    Sig: Take 1 capsule (100 mg total) by mouth 3 (three) times daily as needed.    Dispense:  20 capsule    Refill:  0     -can use tylenol or aleve if needed for fevers, aches and pains per instructions  -can use nasal saline a few times per day if you have nasal congestion; sometimes  a short course of Afrin nasal spray for 3 days can help with symptoms as well  -stay hydrated, drink plenty of fluids and eat small healthy meals - avoid dairy  -can take 1000 IU ( ) Vit D3 and 100-500 mg of Vit C daily per instructions  -If the Covid test is positive, check out the [facility] website for more information on home care, transmission and treatment for COVID19  -follow up with your doctor in 2-3 days unless improving and feeling better  -stay home while sick, except to seek medical care. If you have COVID19, ideally it would be best to stay home for a full 10 days since the onset of symptoms PLUS one day of no fever and feeling better. Wear a good mask that fits snugly (such as N95 or KN95) if around others to reduce the risk of transmission.  It was nice to meet you today, and I really hope you are feeling better soon. I help [facility] out with telemedicine visits on Tuesdays and Thursdays and am available for visits on those days. If you have any concerns or questions following this visit please schedule a follow up visit with your Primary Care doctor or seek care at a local urgent care clinic to avoid delays in care.    Seek in person care or  schedule a follow up video visit promptly if your symptoms worsen, new concerns arise or you are not improving with treatment. Call 911 and/or seek emergency care if your symptoms are severe or life threatening.

## 2021-05-27 ENCOUNTER — Other Ambulatory Visit: Payer: Self-pay

## 2021-05-27 ENCOUNTER — Encounter (HOSPITAL_BASED_OUTPATIENT_CLINIC_OR_DEPARTMENT_OTHER): Payer: Self-pay | Admitting: *Deleted

## 2021-05-27 ENCOUNTER — Emergency Department (HOSPITAL_BASED_OUTPATIENT_CLINIC_OR_DEPARTMENT_OTHER)
Admission: EM | Admit: 2021-05-27 | Discharge: 2021-05-27 | Disposition: A | Discharge status: Home / Self Care | Payer: BC Managed Care – PPO | Attending: Emergency Medicine | Admitting: Emergency Medicine

## 2021-05-27 ENCOUNTER — Telehealth: Payer: BC Managed Care – PPO | Admitting: Physician Assistant

## 2021-05-27 ENCOUNTER — Telehealth: Payer: Self-pay

## 2021-05-27 DIAGNOSIS — J40 Bronchitis, not specified as acute or chronic: Secondary | ICD-10-CM | POA: Diagnosis not present

## 2021-05-27 DIAGNOSIS — U071 COVID-19: Secondary | ICD-10-CM | POA: Insufficient documentation

## 2021-05-27 DIAGNOSIS — J029 Acute pharyngitis, unspecified: Secondary | ICD-10-CM | POA: Diagnosis not present

## 2021-05-27 LAB — GROUP A STREP BY PCR: Group A Strep by PCR: NOT DETECTED

## 2021-05-27 LAB — RESP PANEL BY RT-PCR (FLU A&B, COVID) ARPGX2
Influenza A by PCR: NEGATIVE
Influenza B by PCR: NEGATIVE
SARS Coronavirus 2 by RT PCR: POSITIVE — AB

## 2021-05-27 MED ORDER — BENZONATATE 100 MG PO CAPS: 100.0000 mg | ORAL_CAPSULE | Freq: Three times a day (TID) | ORAL | 0 refills | Status: DC | PRN

## 2021-05-27 MED ORDER — PREDNISONE 10 MG PO TABS: 40.0000 mg | ORAL_TABLET | Freq: Every day | ORAL | 0 refills | Status: AC

## 2021-05-27 NOTE — Telephone Encounter (Signed)
Patient went to [facility] ED    Caller states she's having bad chest pain with a hard time breathing and tightness in her chest. Translation No Nurse Assessment Nurse: Mayford Knife, RN, Whitney Date/Time (Eastern Time): 05/27/2021 8:41:06 AM Confirm and document reason for call. If symptomatic, describe symptoms. ---Caller states she's having bad chest pain, having a hard time breathing in too deeply and having tightness in her chest. States she has a cough and is coughing up phlegm. Had a telehealth visit on Tuesday. Does the patient have any new or worsening symptoms? ---Yes Will a triage be completed? ---Yes Related visit to physician within the last 2 weeks? ---Yes Does the PT have any chronic conditions? (i.e. diabetes, asthma, this includes High risk factors for pregnancy, etc.) ---Yes List chronic conditions. ---exercise induced asthma as a child, allergies Is the patient pregnant or possibly pregnant? (Ask all females between the ages of 29-55) ---No Is this a behavioral health or substance abuse call? ---No Guidelines Guideline Title Affirmed Question Affirmed Notes Nurse Date/Time (Eastern Time) COVID-19 - Diagnosed or Suspected SEVERE or constant chest pain or pressure (Exception: Mild central chest pain, Mayford Knife, RN, Whitney 05/27/2021 8:45:04 AM PLEASE NOTE: All timestamps contained within this report are represented as [place] Standard Time. CONFIDENTIALTY NOTICE: This fax transmission is intended only for the addressee. It contains information that is legally privileged, confidential or otherwise protected from use or disclosure. If you are not the intended recipient, you are strictly prohibited from reviewing, disclosing, copying using or disseminating any of this information or taking any action in reliance on or regarding this information. If you have received this fax in error, please notify us immediately by telephone so that we can arrange for its return  to [place]. Phone: [phone], Toll-Free: [phone], Fax: [phone] Page: 2 of 2 Call Id: [id number] Guidelines Guideline Title Affirmed Question Affirmed Notes Nurse Date/Time Lamount Cohen Time) present only when coughing.) Disp. Time Lamount Cohen Time) Disposition Final User 05/27/2021 8:38:13 AM Send to Urgent Marilu Favre, Alexus 05/27/2021 8:49:03 AM Go to ED Now Yes Mayford Knife, RN, Whitney Caller Disagree/Comply Comply Caller Understands Yes PreDisposition InappropriateToAsk Care Advice Given Per Guideline GO TO ED NOW: * You need to be seen in the Emergency Department. Comments User: Hardie Lora, RN Date/Time (Eastern Time): 05/27/2021 8:44:08 AM negative PCR test, but her daughter is positive. User: Hardie Lora, RN Date/Time (Eastern Time): 05/27/2021 8:44:30 AM Symptoms started Saturday night. Referrals GO TO FACILITY UNDECIDE

## 2021-05-27 NOTE — ED Provider Notes (Signed)
[facility] EMERGENCY DEPT Provider Note   CSN: [id number] Arrival date & time: 05/27/21  0932     History Chief Complaint  Patient presents with   Cough   Sore Throat   Chest Pain   Fever    Yolanda Kelly is a 35 y.o. female.  HPI     Monday daughter tested positive Tuesday started feeling more sick Sunday had sore throat, coughing, tightness This AM woke up with worsening burning, pressure tightness, if upright feeling improved,feels like coughing up something. Still have tightness but is with cough and burning with the cough.  When younger had exercise induced asthma, had pneumonia a few times Saturday nasal congestion Monday night chest uncomfortable then Tuesday felt worse 101 fever today Diarrhea on Tuesday, no vomiting Nausea  No smoking No fam hx of early heart disease--sister has one skips a beat  Called in the inhaler on TUesday Childhood asthma, has improved since then--has gotten better with prednisone  Past Medical History:  Diagnosis Date   Anxiety    Bell's palsy    OCD (obsessive compulsive disorder)    Thalassanemia 2021    Patient Active Problem List   Diagnosis Date Noted   Microcytosis 07/22/2019   Generalized anxiety disorder 06/25/2018   Mixed obsessional thoughts and acts 06/25/2018   Insomnia 05/23/2018   OCD (obsessive compulsive disorder) 03/29/2017   Migraine without aura and without status migrainosus, not intractable 02/16/2017   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   Rectal mucosa prolapse 07/24/2014   Health care maintenance 04/18/2013    Past Surgical History:  Procedure Laterality Date   BUNIONECTOMY Right      OB History     Gravida  3   Para  3   Term      Preterm      AB      Living         SAB      IAB      Ectopic      Multiple      Live Births              Family History  Problem Relation Age of Onset   Thyroid disease Mother    Kidney cancer Mother    Asthma  Father    Irritable bowel syndrome Father    Heart disease Sister    Diabetes Mellitus II Maternal Grandmother    Mental illness Maternal Grandfather    Glaucoma Paternal Grandfather    Celiac disease Sister    Celiac disease Son        tests high for it, not 100% sure he has it   Breast cancer Maternal Aunt    Colon cancer Neg Hx    Throat cancer Neg Hx    Rectal cancer Neg Hx     Social History   Tobacco Use   Smoking status: Never   Smokeless tobacco: Never  Vaping Use   Vaping Use: Never used  Substance Use Topics   Alcohol use: No   Drug use: No    Home Medications Prior to Admission medications   Medication Sig Start Date End Date Taking? Authorizing Provider  albuterol (VENTOLIN HFA) 108 (90 Base) MCG/ACT inhaler Inhale 2 puffs into the lungs every 6 (six) hours as needed for wheezing or shortness of breath. 05/25/21  Yes [name], DO  ALPRAZolam Prudy Feeler) 0.25 MG tablet Take 1 tablet (0.25 mg total) by mouth 2 (two) times daily as needed for anxiety.  09/23/20  Yes Hurst, Glade Nurse, PA-C  benzonatate (TESSALON) 100 MG capsule Take 1 capsule (100 mg total) by mouth 3 (three) times daily as needed for cough (keep out of reach of children). 05/27/21  Yes Alvira Monday, MD  predniSONE (DELTASONE) 10 MG tablet Take 4 tablets (40 mg total) by mouth daily for 4 days. 05/27/21 05/31/21 Yes Alvira Monday, MD  Multiple Vitamin (MULTIVITAMIN) tablet Take 1 tablet by mouth daily.    [provider]    Allergies    Other  Review of Systems   Review of Systems  Constitutional:  Positive for fatigue and fever.  HENT:  Positive for congestion and sore throat.   Respiratory:  Positive for cough, chest tightness and shortness of breath. Choking: cough with deep breath.  Cardiovascular:  Positive for chest pain.  Gastrointestinal:  Positive for diarrhea and nausea. Negative for abdominal pain and vomiting.  Musculoskeletal:  Positive for arthralgias, back pain and  myalgias.  Skin:  Negative for rash.  Neurological:  Negative for headaches.   Physical Exam Updated Vital Signs BP 112/78 (BP Location: Right Arm)   Pulse 92   Temp 98.9 F (37.2 C) (Oral)   Resp 18   Ht 5\' 2"  (1.575 m)   Wt 74.8 kg   LMP 05/02/2021 (Exact Date)   SpO2 99%   BMI 30.18 kg/m   Physical Exam Vitals and nursing note reviewed.  Constitutional:      General: She is not in acute distress.    Appearance: She is well-developed. She is not diaphoretic.  HENT:     Head: Normocephalic and atraumatic.  Eyes:     Conjunctiva/sclera: Conjunctivae normal.  Cardiovascular:     Rate and Rhythm: Normal rate and regular rhythm.     Heart sounds: Normal heart sounds. No murmur heard.   No friction rub. No gallop.  Pulmonary:     Effort: Pulmonary effort is normal. No respiratory distress.     Breath sounds: Normal breath sounds. No wheezing or rales.  Abdominal:     General: There is no distension.     Palpations: Abdomen is soft.     Tenderness: There is no abdominal tenderness. There is no guarding.  Musculoskeletal:        General: No tenderness.     Cervical back: Normal range of motion.  Skin:    General: Skin is warm and dry.     Findings: No erythema or rash.  Neurological:     Mental Status: She is alert and oriented to person, place, and time.    ED Results / Procedures / Treatments   Labs (all labs ordered are listed, but only abnormal results are displayed) Labs Reviewed  RESP PANEL BY RT-PCR (FLU A&B, COVID) ARPGX2 - Abnormal; Notable for the following components:      Result Value   SARS Coronavirus 2 by RT PCR POSITIVE (*)    All other components within normal limits  GROUP A STREP BY PCR    EKG EKG Interpretation  Date/Time:  Thursday May 27 2021 09:56:23 EDT Ventricular Rate:  92 PR Interval:  164 QRS Duration: 88 QT Interval:  339 QTC Calculation: 420 R Axis:   86 Text Interpretation: Sinus rhythm Borderline T wave abnormalities  TW abnormality lead III new, no other significant changes in comparison to prior Confirmed by [id number] (Alvira Monday) on 05/27/2021 11:10:06 AM  Radiology No results found.  Procedures Procedures   Medications Ordered in ED Medications - No data  to display  ED Course  I have reviewed the triage vital signs and the nursing notes.  Pertinent labs & imaging results that were available during my care of the patient were reviewed by me and considered in my medical decision making (see chart for details).    MDM Rules/Calculators/A&P                           35yo female with history above presents with concern for cough, congestion, chest burning with cough.  EKG without signs of pericarditis, no signs of STEMI.  Exam not consistent with pneumonia, pneumothorax, pulmonary edema.  Discussed possibility of doing labs/troponin, however given pain with coughing and in setting of other viral symptoms suspect likely related to bronchitis. Has responded to steroids and had childhood asthma. Given albuterol Tuesday, will discharge with steroids given hx of asthma and improvement in past.  COVID testing positive. Recommend continued supportive care, prednisone with COVID related bronchitis. Patient discharged in stable condition with understanding of reasons to return.    Final Clinical Impression(s) / ED Diagnoses Final diagnoses:  COVID-19  Bronchitis    Rx / DC Orders ED Discharge Orders          Ordered    predniSONE (DELTASONE) 10 MG tablet  Daily        05/27/21 1107    benzonatate (TESSALON) 100 MG capsule  3 times daily PRN        05/27/21 1108             Alvira Monday, MD 05/28/21 1031

## 2021-05-27 NOTE — Telephone Encounter (Signed)
FYI, see triage note

## 2021-05-27 NOTE — ED Triage Notes (Signed)
Sore throat, coughing and chest tightness since Sunday.  Fever this morning, 101.  Tested covid this past, negative.  Daughter is positive for covid.

## 2021-06-03 ENCOUNTER — Ambulatory Visit (INDEPENDENT_AMBULATORY_CARE_PROVIDER_SITE_OTHER): Payer: BC Managed Care – PPO | Admitting: Psychiatry

## 2021-06-03 DIAGNOSIS — F429 Obsessive-compulsive disorder, unspecified: Secondary | ICD-10-CM | POA: Diagnosis not present

## 2021-06-03 NOTE — Progress Notes (Signed)
[facility] Counselor/Therapist Progress Note  Patient ID: Yolanda Kelly, MRN: [id number],    Date: 06/03/2021  Time Spent: 51 minutes start time 12:08 PM end time 12:59 PM Virtual Visit via Video Note Connected with patient by a video enabled telemedicine/telehealth application, with their informed consent, and verified patient privacy and that I am speaking with the correct person using two identifiers. I discussed the limitations, risks, security and privacy concerns of performing psychotherapy and management service by telephone and the availability of in person appointments. I also discussed with the patient that there may be a patient responsible charge related to this service. The patient expressed understanding and agreed to proceed. I discussed the treatment planning with the patient. The patient was provided an opportunity to ask questions and all were answered. The patient agreed with the plan and demonstrated an understanding of the instructions. The patient was advised to call  our office if  symptoms worsen or feel they are in a crisis state and need immediate contact.   Therapist Location: office Patient Location: home    Treatment Type: Individual Therapy  Reported Symptoms: anxiety, sadness, obsessive thoughts  Mental Status Exam:  Appearance:   Well Groomed     Behavior:  Appropriate  Motor:  Normal  Speech/Language:   Normal Rate  Affect:  Appropriate  Mood:  anxious  Thought process:  normal  Thought content:    WNL  Sensory/Perceptual disturbances:    WNL  Orientation:  oriented to person, place, time/date, and situation  Attention:  Good  Concentration:  Good  Memory:  WNL  Fund of knowledge:   Good  Insight:    Good  Judgment:   Good  Impulse Control:  Good   Risk Assessment: Danger to Self:  No Self-injurious Behavior: No Danger to Others: No Duty to Warn:no Physical Aggression / Violence:No  Access to Firearms a concern: No  Gang  Involvement:No   Subjective: Met with patient via virtual session. She explained she is still quarantining after having COVID.She went on to share that things have been hard with her relationship with her husband. She was able to set a limit with him which was progress for her. Her husband finally was able to get her husband evaluated in the ER, which lead him going to his parents' home for a little while.  She explained he gets in all or nothing thinking which is hard for her. She was able to realize he has had a strange perception of her that is not her.  Patient was allowed time to process more of the things that she had been realizing over the past few weeks when her husband had not been staying with them.  Patient was encouraged to continue working on communicating those things with her husband.  She was able to recognize that some of the patterns that they have gotten into are not healthy as she was talking in session she realized there pieces of the relationship with her husband and children that are not healthy as well.  Discussed different ways that she can communicate with the husband and children about him being responsible for his emotions and not the children or her.  Patient was taught a grounding exercise to practice for herself and to teach the children.  Patient was encouraged to recognize her own strengths and gifts as a mother and to feel good about what she is done and to work on her CBT's as well as coping skills.  Interventions:  Cognitive Behavioral Therapy, Solution-Oriented/Positive Psychology, and Insight-Oriented  Diagnosis:   ICD-10-CM   1. Obsessive-compulsive disorder, unspecified type  F42.9       Plan: Patient is to use CBT and coping skills to decrease anxiety symptoms.  Patient is to talk with husband about things she discussed in session.  Patient is to also communicate with her children and make sure they know that they are not responsible for her husband's emotions.   Patient is to continue working with her husband in couples therapy.  Patient is to work on Air traffic controller.  Patient is to take medication as directed. Long-term goal: Enhance ability to handle effectively at full variety of life's anxieties Short-term goal: Identify an anxiety coping mechanism that has been successful in the past and increase its use  Lina Sayre, [facility]

## 2021-06-16 ENCOUNTER — Other Ambulatory Visit: Payer: Self-pay | Admitting: Family Medicine

## 2021-06-17 NOTE — Telephone Encounter (Signed)
Pt needs appt or please check with PCP. Thanks.

## 2021-06-24 ENCOUNTER — Ambulatory Visit: Payer: BC Managed Care – PPO | Admitting: Psychiatry

## 2021-07-12 ENCOUNTER — Ambulatory Visit (INDEPENDENT_AMBULATORY_CARE_PROVIDER_SITE_OTHER): Payer: BC Managed Care – PPO | Admitting: Psychiatry

## 2021-07-12 ENCOUNTER — Other Ambulatory Visit: Payer: Self-pay

## 2021-07-12 DIAGNOSIS — F429 Obsessive-compulsive disorder, unspecified: Secondary | ICD-10-CM | POA: Diagnosis not present

## 2021-07-12 NOTE — Progress Notes (Signed)
      [facility] Counselor/Therapist Progress Note  Patient ID: Yolanda Kelly, MRN: [id number],    Date: 07/12/2021  Time Spent: 55 minutes start time 10:03 AM end time 10:58 AM  Treatment Type: Individual Therapy  Reported Symptoms: anxiety, obsessive thinking, sadness, compulsive behavior, anger  Mental Status Exam:  Appearance:   Casual and Neat     Behavior:  Appropriate  Motor:  Normal  Speech/Language:   Normal Rate  Affect:  Appropriate  Mood:  anxious  Thought process:  normal  Thought content:    WNL  Sensory/Perceptual disturbances:    WNL  Orientation:  oriented to person, place, time/date, and situation  Attention:  Good  Concentration:  Good  Memory:  WNL  Fund of knowledge:   Good  Insight:    Good  Judgment:   Good  Impulse Control:  Good   Risk Assessment: Danger to Self:  No Self-injurious Behavior: No Danger to Others: No Duty to Warn:no Physical Aggression / Violence:No  Access to Firearms a concern: No  Gang Involvement:No   Subjective: Patient was present for session.  She shared that her OCD symptoms have increased recently due to so many things going on and her husband handling things better until this weekend.  She went on to share she has been more vocal with her husband when his behavior isn't okay and setting limits. She shared that she is working to learn how to support and love her husband without being responsible for his emotions and behaviors.  Patient did processing on not feeling sure about the relationship, suds level 9, negative cognition "I have to fix it" felt anxiety in her chest.  Patient was able to reduce suds level to 4.  Through the processing things that she needs to addressed with her husband in couples work was able to surface.  She was also able to recognize that she has to continue making sure she takes care of herself and finding balance.  Interventions: Solution-Oriented/Positive Psychology, Insight-Oriented, and BS  P  Diagnosis:   ICD-10-CM   1. Obsessive-compulsive disorder, unspecified type  F42.9       Plan: Patient is to use CBT and coping skills to decrease anxiety symptoms.  Patient is to continue working on marital issues and therapy with her husband.  Patient is to continue focusing on self-care and releasing negative emotions appropriately.  Patient is to take medication as directed. Long-term goal: Enhance ability to handle effectively at full variety of life's anxieties Short-term goal: Identify an anxiety coping mechanism that has been successful in the past and increase its use  Stevphen Meuse, [facility]

## 2021-07-19 ENCOUNTER — Encounter: Payer: Self-pay | Admitting: Physician Assistant

## 2021-07-19 ENCOUNTER — Ambulatory Visit (INDEPENDENT_AMBULATORY_CARE_PROVIDER_SITE_OTHER): Payer: BC Managed Care – PPO | Admitting: Physician Assistant

## 2021-07-19 ENCOUNTER — Other Ambulatory Visit: Payer: Self-pay

## 2021-07-19 DIAGNOSIS — G4700 Insomnia, unspecified: Secondary | ICD-10-CM

## 2021-07-19 DIAGNOSIS — F429 Obsessive-compulsive disorder, unspecified: Secondary | ICD-10-CM | POA: Diagnosis not present

## 2021-07-19 MED ORDER — FLUVOXAMINE MALEATE 50 MG PO TABS: ORAL_TABLET | ORAL | 1 refills | Status: DC

## 2021-07-19 NOTE — Progress Notes (Signed)
[facility] Med Check  Patient ID: Yolanda Kelly,  MRN: [id number]  PCP: [name], PA  Date of Evaluation: 07/19/2021 Time spent:30 minutes  Chief Complaint:  Chief Complaint   Anxiety; Follow-up     HISTORY/CURRENT STATUS: HPI  For routine med check.  OCD has gotten worse, ruminating and has obsessions, no 'big' compulsions, but does check locks on door, or picks at her fingers. She and her husband are in counseling and working through some things and that has been making the anxiety worse.  She does not like to take the Xanax, but does when absolutely necessary.  She prefers not to be on any medication at all but knows that she needs something right now so she will not obsess about things like she has been.  Patient denies loss of interest in usual activities and is able to enjoy things.  Denies decreased energy or motivation.  Appetite has not changed.  No extreme sadness, tearfulness, or feelings of hopelessness.  Denies any changes in concentration, making decisions or remembering things.  She sleeps well after she gets to sleep but does have a hard time falling asleep.  She cannot get her mind to slow down so she can relax to sleep.  Denies suicidal or homicidal thoughts.  Denies dizziness, syncope, seizures, numbness, tingling, tremor, tics, unsteady gait, slurred speech, confusion. Denies muscle or joint pain, stiffness, or dystonia.  Individual Medical History/ Review of Systems: Changes? :No    Past medications for mental health diagnoses include: Xanax, Unisom, Sonata worked for a while but then it quit.  Buspar caused h/a and nausea, Trazodone caused nightmares.  Allergies: Other  Current Medications:  Current Outpatient Medications:    ALPRAZolam (XANAX) 0.25 MG tablet, Take 1 tablet (0.25 mg total) by mouth 2 (two) times daily as needed for anxiety., Disp: 30 tablet, Rfl: 1   fluvoxaMINE (LUVOX) 50 MG tablet, 1/2 pill nightly for 2 weeks and then increase to  1 p.o. nightly, Disp: 30 tablet, Rfl: 1   albuterol (VENTOLIN HFA) 108 (90 Base) MCG/ACT inhaler, Inhale 2 puffs into the lungs every 6 (six) hours as needed for wheezing or shortness of breath. (Patient not taking: Reported on 07/19/2021), Disp: 8 g, Rfl: 0   benzonatate (TESSALON) 100 MG capsule, Take 1 capsule (100 mg total) by mouth 3 (three) times daily as needed for cough (keep out of reach of children). (Patient not taking: Reported on 07/19/2021), Disp: 21 capsule, Rfl: 0   Multiple Vitamin (MULTIVITAMIN) tablet, Take 1 tablet by mouth daily. (Patient not taking: Reported on 07/19/2021), Disp: , Rfl:  Medication Side Effects: none  Family Medical/ Social History: Changes?  No  MENTAL HEALTH EXAM:  There were no vitals taken for this visit.There is no height or weight on file to calculate BMI.  General Appearance: Casual, Neat and Well Groomed  Eye Contact:  Good  Speech:  Clear and Coherent  Volume:  Normal  Mood:  Anxious  Affect:  Congruent  Thought Process:  Goal Directed and Descriptions of Associations: Intact  Orientation:  Full (Time, Place, and Person)  Thought Content: Logical   Suicidal Thoughts:  No  Homicidal Thoughts:  No  Memory:  WNL  Judgement:  Good  Insight:  Good  Psychomotor Activity:  Normal  Concentration:  Concentration: Good and Attention Span: Good  Recall:  Good  Fund of Knowledge: Good  Language: Good  Assets:  Desire for Improvement  ADL's:  Intact  Cognition: WNL  Prognosis:  Good  DIAGNOSES:    ICD-10-CM   1. Obsessive-compulsive disorder, unspecified type  F42.9     2. Insomnia, unspecified type  G47.00        Receiving Psychotherapy: No In the past with Stevphen Meuse, [facility]   RECOMMENDATIONS:  PDMP reviewed.  Last Xanax filled 12/20/2020.  Sonata filled 12/16/2020. I provided 30 minutes of face to face time during this encounter, including time spent before and after the visit in records review, medical decision making, and  charting.  Discussed OCD, recommend either Luvox, Zoloft, or clomipramine.  My first choice would be Luvox starting at a very low dose because of her sensitivity and her anxiety about taking a medication like that.  This will help her relax to go to sleep as well as help with anxiety, OCD, and depression.  Benefits, risk, side effects were discussed and she accepts. Start Luvox 50 mg, one half p.o. nightly for 2 weeks and then increase to 1 p.o. nightly.  If she is feeling of the slightest bit better and wants to stay at the 25 mg then she does not have to increase to 50 mg.  She verbalizes understanding. Continue Xanax 0.25mg , 1/2-1 po bid prn. Continue marriage counseling. Return in 6 weeks  [name], PA-C

## 2021-07-27 ENCOUNTER — Ambulatory Visit: Payer: Self-pay | Admitting: Psychiatry

## 2021-08-10 ENCOUNTER — Ambulatory Visit (INDEPENDENT_AMBULATORY_CARE_PROVIDER_SITE_OTHER): Payer: BC Managed Care – PPO | Admitting: Psychiatry

## 2021-08-10 ENCOUNTER — Other Ambulatory Visit: Payer: Self-pay

## 2021-08-10 ENCOUNTER — Other Ambulatory Visit: Payer: Self-pay | Admitting: Physician Assistant

## 2021-08-10 DIAGNOSIS — F429 Obsessive-compulsive disorder, unspecified: Secondary | ICD-10-CM | POA: Diagnosis not present

## 2021-08-10 NOTE — Progress Notes (Signed)
      [facility] Counselor/Therapist Progress Note  Patient ID: Yolanda Kelly, MRN: [id number],    Date: 08/10/2021  Time Spent: 60 minutes start time 8:08 AM end time 9:08 AM  Treatment Type: Individual Therapy  Reported Symptoms: anxiety, obsessive thinking, sadness, compulsive behavior  Mental Status Exam:  Appearance:   Casual     Behavior:  Appropriate  Motor:  Normal  Speech/Language:   Normal Rate  Affect:  Appropriate  Mood:  anxious  Thought process:  normal  Thought content:    WNL  Sensory/Perceptual disturbances:    WNL  Orientation:  oriented to person, place, time/date, and situation  Attention:  Good  Concentration:  Good  Memory:  WNL  Fund of knowledge:   Good  Insight:    Good  Judgment:   Good  Impulse Control:  Good   Risk Assessment: Danger to Self:  No Self-injurious Behavior: No Danger to Others: No Duty to Warn:no Physical Aggression / Violence:No  Access to Firearms a concern: No  Gang Involvement:No   Subjective: Patient was present for session. She shared that her husband has had a change in behavior that has triggered anxiety for her. She was able to realize that it has been hard for her to know how to progress with him.  Patient did processing set on husband's shift and behaviors, suds level 8, and negative cognition "I am not in control" felt confusion and anxiety in her chest.  Patient was able to reduce suds level to 4.  She was able to recognize that some of the situation in her current home was similar to growing up and that was getting triggered.  Discussed ways to take care of herself and to keep things moving in a positive direction.  Patient reported feeling good about processing from session but recognized it needed to continue as they continue on their journey as a couple.  Interventions: Solution-Oriented/Positive Psychology, Eye Movement Desensitization and Reprocessing (EMDR), and Insight-Oriented  Diagnosis:   ICD-10-CM   1.  Obsessive-compulsive disorder, unspecified type  F42.9       Plan: Patient is to use CBT and coping skills to decrease anxiety.  Patient is to work on Investment banker, operational and recognizing her strengths.  Patient is to continue working with husband and marital counseling.  Patient is to take medication as directed. Long-term goal: Enhance ability to handle effectively at full variety of life's anxieties Short-term goal: Identify an anxiety coping mechanism that has been successful in the past and increase its use  Stevphen Meuse, [facility]

## 2021-08-11 ENCOUNTER — Telehealth: Payer: Self-pay | Admitting: Physician Assistant

## 2021-08-11 NOTE — Telephone Encounter (Signed)
Pt LM on VM stating she needs to follow up with TH, she has questions. Contact # [phone]   apt 12/20

## 2021-08-12 ENCOUNTER — Other Ambulatory Visit: Payer: Self-pay | Admitting: Physician Assistant

## 2021-08-12 MED ORDER — CLOMIPRAMINE HCL 25 MG PO CAPS: ORAL_CAPSULE | ORAL | 1 refills | Status: DC

## 2021-08-12 NOTE — Telephone Encounter (Signed)
Pt stated the fluvoxamine is causing her to not be able to sleep.She has trouble falling asleep but mainly staying asleep.She asked if she could wean off and try another med that you mentioned to her.

## 2021-08-12 NOTE — Telephone Encounter (Signed)
Have her wean off the Luvox 50 mg by taking 1/2 pill for 4 nights, then stop. At the same time, start clomipramine.  She will have to take this at night and it is usually very sedating.  I will start at the lowest dose and after 1 week she can increase that, but does not have to.  I know she is very sensitive to medications.  Please confirm her pharmacy and I will send again. Thank you.

## 2021-08-12 NOTE — Telephone Encounter (Signed)
[id number] - [place], [place] - [address]

## 2021-08-12 NOTE — Telephone Encounter (Signed)
Prescription sent

## 2021-08-18 ENCOUNTER — Ambulatory Visit: Payer: BC Managed Care – PPO | Admitting: Physician Assistant

## 2021-08-31 ENCOUNTER — Ambulatory Visit: Payer: BC Managed Care – PPO | Admitting: Physician Assistant

## 2021-09-05 ENCOUNTER — Other Ambulatory Visit: Payer: Self-pay | Admitting: Physician Assistant

## 2021-09-09 ENCOUNTER — Other Ambulatory Visit: Payer: Self-pay | Admitting: Physician Assistant

## 2021-09-15 ENCOUNTER — Ambulatory Visit: Payer: BC Managed Care – PPO | Admitting: Psychiatry

## 2021-09-28 DIAGNOSIS — Z6829 Body mass index (BMI) 29.0-29.9, adult: Secondary | ICD-10-CM | POA: Diagnosis not present

## 2021-09-28 DIAGNOSIS — N926 Irregular menstruation, unspecified: Secondary | ICD-10-CM | POA: Diagnosis not present

## 2021-09-28 DIAGNOSIS — Z01419 Encounter for gynecological examination (general) (routine) without abnormal findings: Secondary | ICD-10-CM | POA: Diagnosis not present

## 2021-09-29 ENCOUNTER — Other Ambulatory Visit: Payer: Self-pay

## 2021-09-29 ENCOUNTER — Ambulatory Visit (INDEPENDENT_AMBULATORY_CARE_PROVIDER_SITE_OTHER): Payer: BC Managed Care – PPO | Admitting: Psychiatry

## 2021-09-29 DIAGNOSIS — F429 Obsessive-compulsive disorder, unspecified: Secondary | ICD-10-CM

## 2021-09-29 NOTE — Progress Notes (Signed)
°    [facility] Counselor/Therapist Progress Note ° °Patient ID: Yolanda Kelly, MRN: [id number],   ° °Date: 09/29/2021 ° °Time Spent: 44 minutes start time 11:19 AM end time 12:03 PM °Virtual Visit via video note °Connected with patient by a telemedicine/telehealth application, with their informed consent, and verified patient privacy and that I am speaking with the correct person using two identifiers. I discussed the limitations, risks, security and privacy concerns of performing psychotherapy and the availability of in person appointments. I also discussed with the patient that there may be a patient responsible charge related to this service. The patient expressed understanding and agreed to proceed. °I discussed the treatment planning with the patient. The patient was provided an opportunity to ask questions and all were answered. The patient agreed with the plan and demonstrated an understanding of the instructions. The patient was advised to call  our office if  symptoms worsen or feel they are in a crisis state and need immediate contact. °  °Therapist Location: Home °Patient Location: Car °  ° °Treatment Type: Individual Therapy ° °Reported Symptoms: anxiety, health issues, obsessive thinking,  ° °Mental Status Exam: ° °Appearance:   Well Groomed     °Behavior:  Appropriate  °Motor:  Normal  °Speech/Language:   Normal Rate  °Affect:  Appropriate  °Mood:  anxious and sad  °Thought process:  normal  °Thought content:    WNL  °Sensory/Perceptual disturbances:    WNL  °Orientation:  oriented to person, place, time/date, and situation  °Attention:  Good  °Concentration:  Good  °Memory:  WNL  °Fund of knowledge:   Good  °Insight:    Good  °Judgment:   Good  °Impulse Control:  Good  ° °Risk Assessment: °Danger to Self:  No °Self-injurious Behavior: No °Danger to Others: No °Duty to Warn:no °Physical Aggression / Violence:No  °Access to Firearms a concern: No  °Gang Involvement:No  ° °Subjective: Met with patient  via virtual session.  She shared that she is continuing to have struggles with her husband.  She shared that she is having difficulty being able to relax and noted that the changes are permanent rather than temporary.  She also shared she has started going to school and is feeling overwhelmed with all the transitions that are having to take place.  She explained with her OCD letting go of control has been very difficult.  Encouraged patient to focus on all the positives that can happen for she and her kids as she starts going to school.  She was also encouraged to realize that she has laid a very positive foundation with her children and that even though they may struggle they will become more independent and can benefit from her returning to school just like she can.  Patient was able to see some of the positives and as she discussed it recognize good things were already happening.  The importance of writing these things down and referring to them as needed was discussed with patient.  Patient reported feeling more positive at the end of session and was willing to work on her CBT skills and filters. ° °Interventions: Cognitive Behavioral Therapy and Solution-Oriented/Positive Psychology ° °Diagnosis: °  ICD-10-CM   °1. Obsessive-compulsive disorder, unspecified type  F42.9   °  ° ° °Plan: Patient is to use CBT and coping skills to continue decreasing anxiety and obsessive thinking.  Patient is to write down the way things are working out positively to refer back to as needed.    as needed.  Patient is to use self talk to work through letting go of control of situations.  Patient is to take medication as directed Long-term goal: Enhance ability to handle effectively at full variety of life's anxieties Short-term goal: Identify an anxiety coping mechanism that has been successful in the past and increase its use  Lina Sayre, [facility]

## 2021-10-11 ENCOUNTER — Ambulatory Visit: Payer: BC Managed Care – PPO | Admitting: Physician Assistant

## 2022-02-08 ENCOUNTER — Ambulatory Visit (INDEPENDENT_AMBULATORY_CARE_PROVIDER_SITE_OTHER): Payer: BC Managed Care – PPO | Admitting: Physician Assistant

## 2022-02-08 ENCOUNTER — Encounter: Payer: Self-pay | Admitting: Physician Assistant

## 2022-02-08 VITALS — BP 110/70 | HR 66 | Temp 98.3°F | Ht 62.0 in | Wt 164.0 lb

## 2022-02-08 DIAGNOSIS — R42 Dizziness and giddiness: Secondary | ICD-10-CM

## 2022-02-08 DIAGNOSIS — H6982 Other specified disorders of Eustachian tube, left ear: Secondary | ICD-10-CM | POA: Diagnosis not present

## 2022-02-08 NOTE — Patient Instructions (Signed)
It was great to see you!  I think you are experiencing eustachian tube dysfunction (see handout)  Start daily over the counter antihistamines such as Zyrtec (cetirizine), Claritin (loratadine), Allegra (fexofenadine), or Xyzal (levocetirizine) daily as needed. May take twice a day if needed as long as it does not cause drowsiness. Start Flonase 1 spray twice a day. Nasal saline spray (i.e., Simply Saline) or nasal saline lavage (i.e., NeilMed) is recommended as needed and prior to medicated nasal sprays.  If symptoms do not improve, let me know!  Take care,  [name] PA-C

## 2022-02-08 NOTE — Progress Notes (Signed)
Yolanda Kelly is a 36 y.o. female here for a new problem.   History of Present Illness:   Chief Complaint  Patient presents with   Dizziness    Pt c/o dizziness x 2 weeks off and on, worse past 3 days.   Otalgia    Right ear pain , worse past 3-4 days.    HPI  Dizziness  Patient complain of dizziness that has been onset for the past 2 weeks. Symptoms seems to be worsening for the past 3 days. She has been experiencing room spinning which has been progressively worsening. She has been feeling some lightheadedness. Feels like she has to hold onto things whenever she feel dizziness. She was on vacation last week and has noticed building moving.  No reported trauma or injury. No slurred speech. No shortness of breath. Denies blurred vision. No associated photophobia, visual changes, numbness, or focal deficit. No LE swelling.  Ear pain Patient complain of right ear pain that has been present for the past 3-4 days. Her daughter has been sick at home but she denies any symptoms. Her throat is dry. Has had some nasal congestion. No reported fever or chills. No cough. No postnasal drip or sore throat. Denies left ear pain.  Past Medical History:  Diagnosis Date   Anxiety    Bell's palsy    OCD (obsessive compulsive disorder)    Thalassanemia 2021     Social History   Tobacco Use   Smoking status: Never   Smokeless tobacco: Never  Vaping Use   Vaping Use: Never used  Substance Use Topics   Alcohol use: No   Drug use: No    Past Surgical History:  Procedure Laterality Date   BUNIONECTOMY Right     Family History  Problem Relation Age of Onset   Thyroid disease Mother    Kidney cancer Mother    Asthma Father    Irritable bowel syndrome Father    Heart disease Sister    Diabetes Mellitus II Maternal Grandmother    Mental illness Maternal Grandfather    Glaucoma Paternal Grandfather    Celiac disease Sister    Celiac disease Son        tests high for it, not 100% sure he  has it   Breast cancer Maternal Aunt    Colon cancer Neg Hx    Throat cancer Neg Hx    Rectal cancer Neg Hx     Allergies  Allergen Reactions   Other Anaphylaxis    Coke and red [place], Throat closes up    Current Medications:   Current Outpatient Medications:    ALPRAZolam (XANAX) 0.25 MG tablet, Take 1 tablet (0.25 mg total) by mouth 2 (two) times daily as needed for anxiety., Disp: 30 tablet, Rfl: 1   Multiple Vitamin (MULTIVITAMIN) tablet, Take 1 tablet by mouth daily., Disp: , Rfl:    Review of Systems:   ROS Negative unless otherwise specified per HPI.   Vitals:   Vitals:   02/08/22 1135  BP: 110/70  Pulse: 66  Temp: 98.3 F (36.8 C)  TempSrc: Temporal  SpO2: 98%  Weight: 164 lb (74.4 kg)  Height: 5\' 2"  (1.575 m)     Body mass index is 30 kg/m.  Physical Exam:   Physical Exam Vitals and nursing note reviewed.  Constitutional:      General: She is not in acute distress.    Appearance: She is well-developed. She is not ill-appearing or toxic-appearing.  HENT:  Head: Normocephalic and atraumatic.     Right Ear: Ear canal and external ear normal. A middle ear effusion is present. Tympanic membrane is not erythematous, retracted or bulging.     Left Ear: Tympanic membrane, ear canal and external ear normal. Tympanic membrane is not erythematous, retracted or bulging.     Nose: Nose normal.     Right Sinus: No maxillary sinus tenderness or frontal sinus tenderness.     Left Sinus: No maxillary sinus tenderness or frontal sinus tenderness.     Mouth/Throat:     Pharynx: Uvula midline. No posterior oropharyngeal erythema.  Eyes:     General: Lids are normal.     Conjunctiva/sclera: Conjunctivae normal.  Neck:     Trachea: Trachea normal.  Cardiovascular:     Rate and Rhythm: Normal rate and regular rhythm.     Heart sounds: Normal heart sounds, S1 normal and S2 normal.  Pulmonary:     Effort: Pulmonary effort is normal.     Breath sounds: Normal  breath sounds. No decreased breath sounds, wheezing, rhonchi or rales.  Lymphadenopathy:     Cervical: No cervical adenopathy.  Skin:    General: Skin is warm and dry.  Neurological:     General: No focal deficit present.     Mental Status: She is alert.     Cranial Nerves: Cranial nerves 2-12 are intact.     Sensory: Sensation is intact.     Motor: Motor function is intact.     Coordination: Coordination is intact.     Gait: Gait is intact.  Psychiatric:        Speech: Speech normal.        Behavior: Behavior normal. Behavior is cooperative.    Assessment and Plan:   ETD (Eustachian tube dysfunction), left No red flags on exam, suspect ETD Recommend resumption of daily antihistamine Recommend starting saline nasal spray and flonase If no improvement, may consider oral prednisone  Dizziness No red flags Neuro exam normal Recommend treatment for ETD above, if no improvement, will send to vestibular rehab Counseled on stroke precautions -- if any worsening symptoms, advised that she seek urgent/emergent care  I,Savera Zaman,acting as a scribe for [name], PA.,have documented all relevant documentation on the behalf of [name], PA,as directed by  [name], PA while in the presence of [name], [name].   I, [name], [name], have reviewed all documentation for this visit. The documentation on 02/08/22 for the exam, diagnosis, procedures, and orders are all accurate and complete.  [name], PA-C

## 2022-02-11 ENCOUNTER — Other Ambulatory Visit: Payer: Self-pay | Admitting: Physician Assistant

## 2022-02-11 ENCOUNTER — Encounter: Payer: Self-pay | Admitting: Physician Assistant

## 2022-02-11 DIAGNOSIS — R42 Dizziness and giddiness: Secondary | ICD-10-CM

## 2022-02-22 ENCOUNTER — Ambulatory Visit: Payer: BC Managed Care – PPO | Attending: Physician Assistant

## 2022-02-22 DIAGNOSIS — R42 Dizziness and giddiness: Secondary | ICD-10-CM | POA: Insufficient documentation

## 2022-02-22 DIAGNOSIS — R2681 Unsteadiness on feet: Secondary | ICD-10-CM | POA: Insufficient documentation

## 2022-02-22 NOTE — Therapy (Signed)
OUTPATIENT PHYSICAL THERAPY VESTIBULAR EVALUATION     Patient Name: Yolanda Kelly MRN: [id number] DOB:[birth year], 36 y.o., female Today's Date: 02/22/2022  PCP: Cherre Robins42 (ICD-10-CM) - Dizziness  REFERRING PROVIDER: Jarold MottoWorley, Samantha, PA    PT End of Session - 02/22/22 0843     Visit Number 1    Number of Visits 4    Date for PT Re-Evaluation 04/19/22    Authorization Type [employer] 2023    Authorization Time Period Initial eval/treat authorized for DOS, need auth for additional visits    Authorization - Visit Number 1    Authorization - Number of Visits 30    Progress Note Due on Visit 10    PT Start Time 0845    PT Stop Time 0930    PT Time Calculation (min) 45 min    Activity Tolerance Patient tolerated treatment well    [facility] tasks assessed/performed             Past Medical History:  Diagnosis Date   Anxiety    Bell's palsy    OCD (obsessive compulsive disorder)    Thalassanemia 2021   Past Surgical History:  Procedure Laterality Date   BUNIONECTOMY Right    Patient Active Problem List   Diagnosis Date Noted   Microcytosis 07/22/2019   Generalized anxiety disorder 06/25/2018   Mixed obsessional thoughts and acts 06/25/2018   Insomnia 05/23/2018   OCD (obsessive compulsive disorder) 03/29/2017   Migraine without aura and without status migrainosus, not intractable 02/16/2017   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 Pt notes feeling fullness in right ear. Pt notes she could be sitting and scrolling on computer and feels instances of dizziness. Pt notes recent travel and [place] busy airport  felt sensation of floor moving moving, lasting seconds and residual feeling lasts for minutes. Pt has not had any experience like this before.  Pt notes hx of chronic ear pain, migraine, and notes some increase in otalgia. Notes that she has been experiencing some increase in her left ear pain with this episode Pt accompanied by: self  PERTINENT HISTORY: hx of Bell's Palsy/Ramsay Hunt syndrome   PAIN:  Are you having pain? No  PRECAUTIONS: None  WEIGHT BEARING RESTRICTIONS No  FALLS: Has patient fallen in last 6 months? No  LIVING ENVIRONMENT: Lives with: lives with their family Lives in: House/apartment Stairs:  PLOF: Independent  PATIENT GOALS get rid of dizziness  OBJECTIVE:   DIAGNOSTIC FINDINGS: PCP exam and assessment of ears  COGNITION: Overall cognitive status: Within functional limits for tasks assessed   SENSATION: WFL    MUSCLE TONE: WNL  DTRs:    POSTURE: No Significant postural limitations   Cervical ROM:    WNL, no complaints  STRENGTH: WNL    GAIT: Independent  FUNCTIONAL TESTs:   M-CTSIB  Condition 1: Firm Surface, EO 30 Sec, Normal Sway  Condition 2: Firm Surface, EC 30 Sec, Mild Sway  Condition 3: Foam Surface, EO 30 Sec, Normal Sway  Condition 4: Foam Surface, EC 30 Sec, Moderate Sway     PATIENT SURVEYS:  FOTO to be assessed   VESTIBULAR ASSESSMENT   GENERAL OBSERVATION:  WNL    SYMPTOM BEHAVIOR:   Subjective history: x 1 month onset of dizziness/world moving   Non-Vestibular symptoms: changes in hearing, headaches, tinnitus, and migraine symptoms-- notes these issues are chronic   Type of dizziness: "World moves"   Frequency: intermittent   Duration: seconds for sensation and residual feeling of symptoms for minutes   Aggravating factors: No known aggravating factors, Spontaneous, and Worse outside or in busy environment   Relieving factors: no known relieving factors   Progression of symptoms: better   OCULOMOTOR  EXAM:   Ocular Alignment: normal   Ocular ROM: No Limitations   Spontaneous Nystagmus: absent   Gaze-Induced Nystagmus: absent   Smooth Pursuits: intact   Saccades: intact   Convergence/Divergence: 3 cm      VESTIBULAR - OCULAR REFLEX:    Slow VOR: Normal, feeling some effect with vertical   VOR Cancellation: Normal   Head-Impulse Test: HIT Right: negative HIT Left: negative   Dynamic Visual Acuity:     POSITIONAL TESTING: Right Dix-Hallpike: no nystagmus Left Dix-Hallpike: no nystagmus Right Roll Test: no nystagmus Left Roll Test: no nystagmus  No provocation of symptoms other than returning to sitting from supine  MOTION SENSITIVITY:    Motion Sensitivity Quotient  Intensity: 0 = none, 1 = Lightheaded, 2 = Mild, 3 = Moderate, 4 = Severe, 5 = Vomiting  Intensity  1. Sitting to supine   2. Supine to L side   3. Supine to R side   4. Supine to sitting   5. L Hallpike-Dix 1  6. Up from L  1  7. R Hallpike-Dix   8. Up from R    9. Sitting, head  tipped to L knee   10. Head up from L  knee   11. Sitting, head  tipped to R knee   12. Head up from R  knee   13. Sitting head turns x5   14.Sitting head nods x5   15. In stance, 180  turn to L    16. In stance, 180  turn to R     OTHOSTATICS: not done  FUNCTIONAL GAIT:  not completed   VESTIBULAR TREATMENT:  Canalith Repositioning:   Comment: not indicated at this time Gaze Adaptation:   x1 Viewing Vertical:  Position: seated, Time: 30 sec, and Reps: 3 Habituation:   Other: corner balance, eyes closed Other:   PATIENT EDUCATION: Education details: assessment findings Person educated: Patient Education method: Explanation, Demonstration, and Handouts Education comprehension: verbalized understanding and needs further education  Access Code: [email or web address] URL: [email or web address] Date: 02/22/2022 Prepared by: Shary Decamp  Exercises - Seated Gaze Stabilization with Head Nod and Vertical  Arm Movement  - 3-5 x daily - 7 x weekly - 3 sets - 15-30 hold--progress to 120 bpm 4/10 symptoms - Corner Balance Feet Together With Eyes Closed  - 1 x daily - 7 x weekly - 1-3 sets - 30 sec hold - Corner Balance Feet Together: Eyes Open With Head Turns  - 1 x daily - 7 x weekly - 1-3 sets - 30 sec hold - Semi-Tandem Corner Balance With Eyes Closed  - 1 x daily - 7 x weekly - 1-3 sets - 30 sec hold GOALS: Goals reviewed with patient? Yes  SHORT TERM GOALS: Target date: 03/15/2022   The patient will be independent with HEP for gaze adaptation, habituation, balance, and general mobility. Baseline: Goal status: INITIAL    LONG TERM GOALS: Target date: 04/19/2022    Patient  will demo Normal sway condition 4 M-CTSIB to manifest improved vestibular function Baseline: moderate Goal status: INITIAL  2.  Patient will report absence of symptoms in busy environments and no spontaneous onset to restore to PLOF Baseline: provoked by busy environment, spontaneous onset Goal status: INITIAL    ASSESSMENT:  CLINICAL IMPRESSION: Patient is a 36 y.o. lady who was seen today for physical therapy evaluation and treatment for dizziness. Presents with some positional sensitivities but no overt positional vertigo manifest on this date.  Demonstrates some signs and symptoms that may be consistent with a vestibular hypofunction given her postural instability during condition 4 M-CTSIB and during transitional movements from supine to long-sitting causing a similar feeling of unsteadiness/dizziness. Patient would benefit from continued PT services to facilitate vestibular rehabilitation to minimize symptoms and restore capabilities to PLOF   OBJECTIVE IMPAIRMENTS dizziness.   ACTIVITY LIMITATIONS bending and locomotion level  PARTICIPATION LIMITATIONS: community activity  PERSONAL FACTORS Age and Time since onset of injury/illness/exacerbation are also affecting patient's functional outcome.   REHAB  POTENTIAL: Excellent  CLINICAL DECISION MAKING: Stable/uncomplicated  EVALUATION COMPLEXITY: Low   PLAN: PT FREQUENCY: 1x/week  PT DURATION: 8 weeks  PLANNED INTERVENTIONS: Therapeutic exercises, Therapeutic activity, Neuromuscular re-education, Balance training, Gait training, Patient/Family education, Joint mobilization, Vestibular training, Canalith repositioning, Dry Needling, Spinal mobilization, and Manual therapy  PLAN FOR NEXT SESSION: re-assess for positional if needed, gaze stabilization vs habituation?   Dion Body, PT 02/22/2022, 1:09 PM

## 2022-02-23 ENCOUNTER — Telehealth: Payer: Self-pay | Admitting: Physician Assistant

## 2022-02-23 NOTE — Telephone Encounter (Signed)
States PT for patient was approved for 4 visits between June 14th to July 12.   Auth # [id number]  I will add to referral.

## 2022-03-07 IMAGING — MR MR HEAD WO/W CM
12 series · 48 of 48 positions shown · IV contrast (multihance)
Comparison: None.

CLINICAL DATA: Facial numbness, Janpiet Pasch auricular syndrome

EXAM:
MRI HEAD WITHOUT AND WITH CONTRAST
TECHNIQUE: Multiplanar, multiecho pulse sequences of the brain and surrounding
structures were obtained without and with intravenous contrast.
CONTRAST:  14mL MULTIHANCE GADOBENATE DIMEGLUMINE 529 MG/ML IV SOLN

[Series 2: T1 · sagittal · 5.0mm · 0.45mm/px · 1 of 24 slices shown]
[im 1/24]
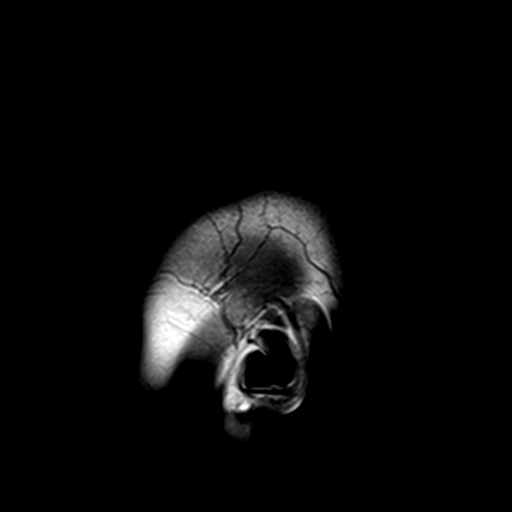

[Series 3: DWI · axial · 3.0mm · 1.80mm/px · z∈[-47,+100]mm · 6 of 100 slices shown (1 of 4)]
[im 1/100]
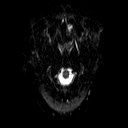
[im 20/100]
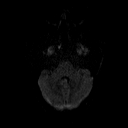
[im 40/100]
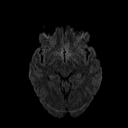
[im 60/100]
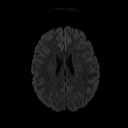
[im 80/100]
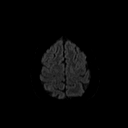
[im 100/100]
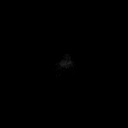

[Series 4: DWI · axial · 3.0mm · 1.80mm/px · z∈[-47,+100]mm · 3 of 50 slices shown (2 of 4)]
[im 1/50]
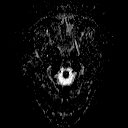
[im 25/50]
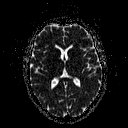
[im 50/50]
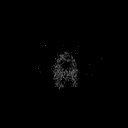

[Series 5: DWI · coronal · 5.0mm · 1.80mm/px · 5 of 72 slices shown (3 of 4)]
[im 1/72]
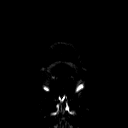
[im 18/72]
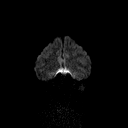
[im 36/72]
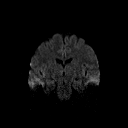
[im 54/72]
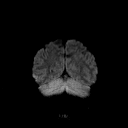
[im 72/72]
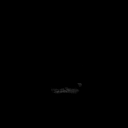

[Series 6: DWI · coronal · 5.0mm · 1.80mm/px · 2 of 36 slices shown (4 of 4)]
[im 1/36]
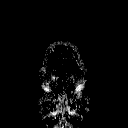
[im 36/36]
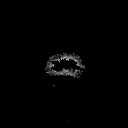

[Series 7: T2 · axial · 5.0mm · 0.60mm/px · z∈[-51,+104]mm · 2 of 24 slices shown (1 of 2)]
[im 1/24]
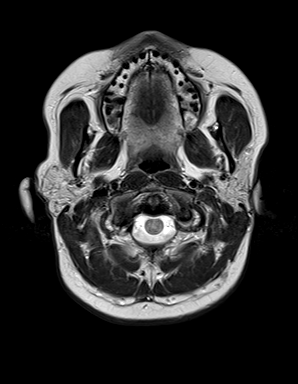
[im 24/24]
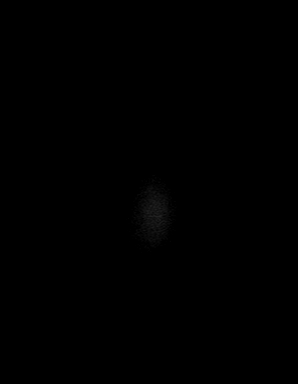

[Series 8: FLAIR · axial · 3.0mm · 0.45mm/px · z∈[-48,+101]mm · 2 of 33 slices shown]
[im 1/33]
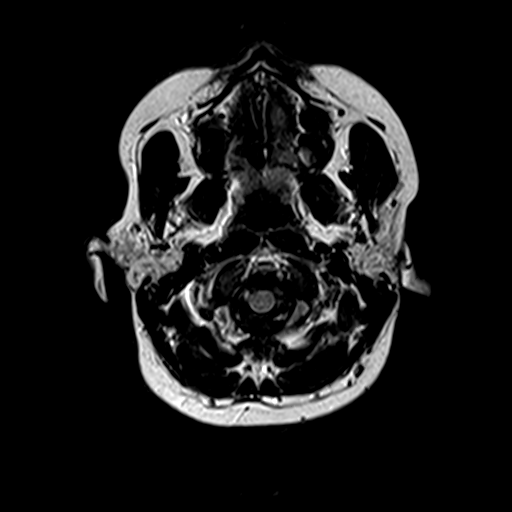
[im 33/33]
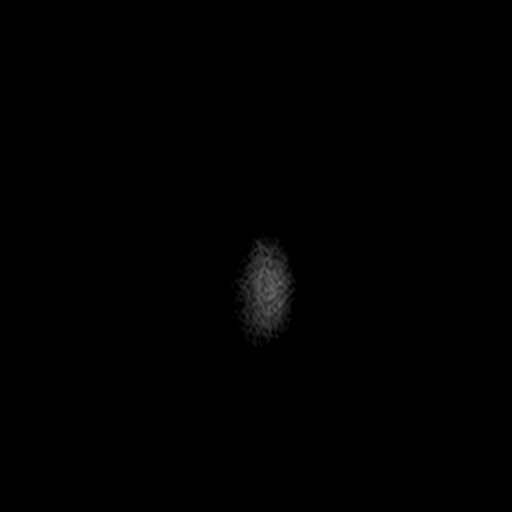

[Series 10: swi_images · axial · 4.0mm · 0.90mm/px · z∈[-51,+105]mm · 3 of 40 slices shown]
[im 1/40]
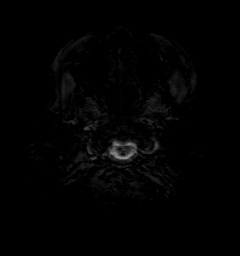
[im 20/40]
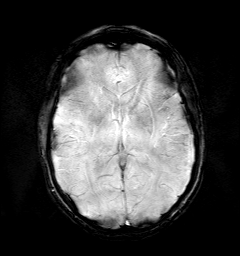
[im 40/40]
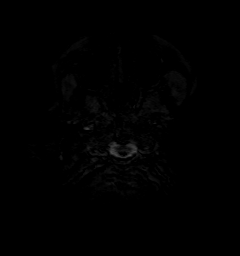

[Series 11: t1_mpr_tra · axial · 1.0mm · 0.75mm/px · z∈[-45,+98]mm · 10 of 144 slices shown (1 of 2)]
[im 1/144]
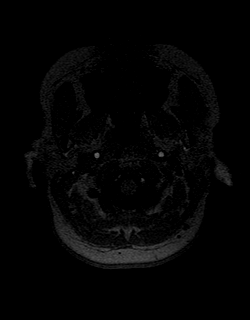
[im 16/144]
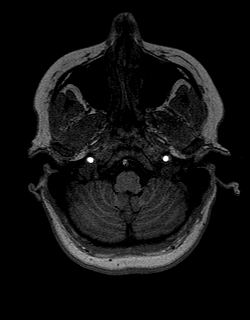
[im 32/144]
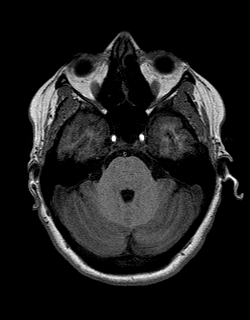
[im 48/144]
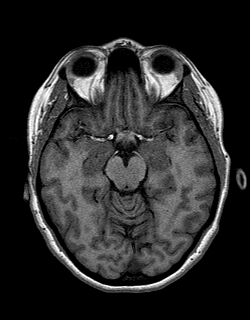
[im 64/144]
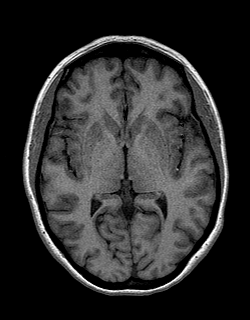
[im 80/144]
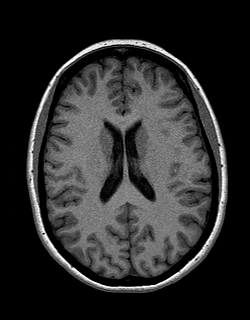
[im 96/144]
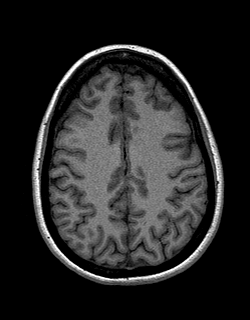
[im 112/144]
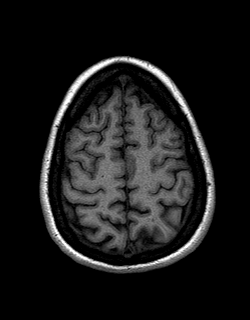
[im 128/144]
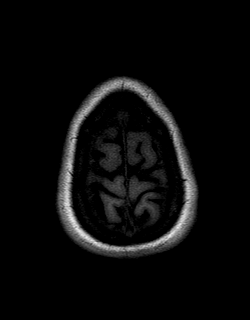
[im 144/144]
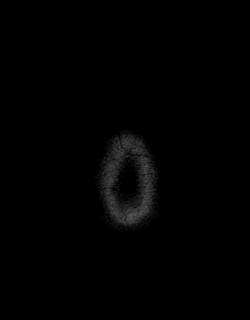

[Series 12: T2 · coronal · 5.0mm · 0.45mm/px · 2 of 28 slices shown (2 of 2)]
[im 1/28]
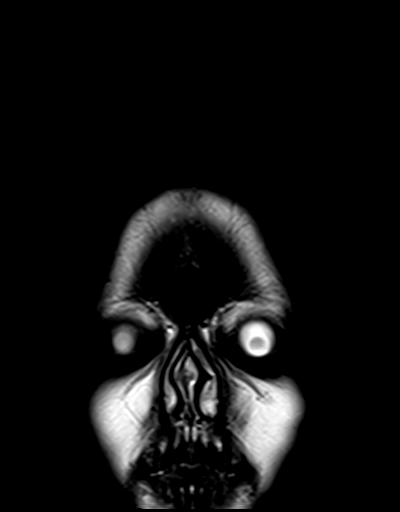
[im 28/28]
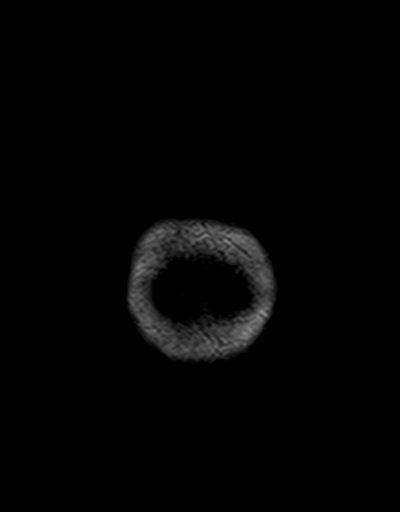

[Series 13: t1_mpr_tra · axial · 1.0mm · 0.75mm/px · z∈[-45,+98]mm · 10 of 144 slices shown (2 of 2)]
[im 1/144]
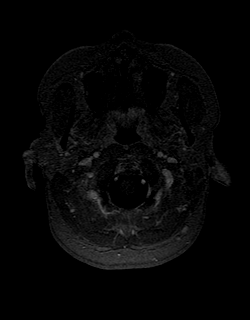
[im 16/144]
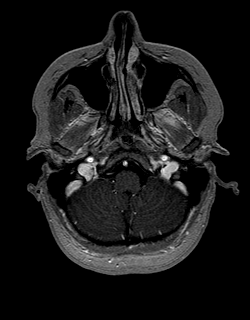
[im 32/144]
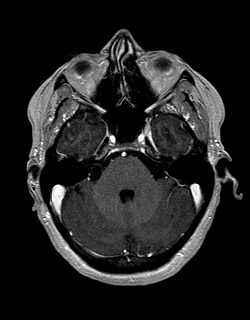
[im 48/144]
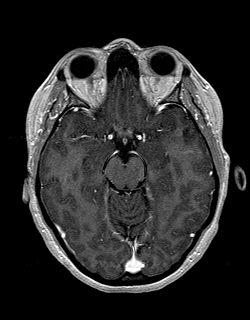
[im 64/144]
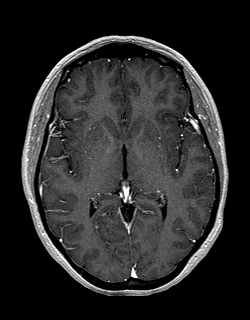
[im 80/144]
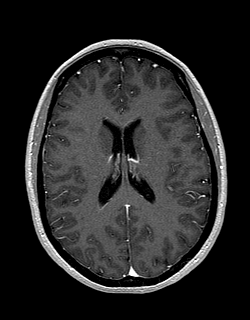
[im 96/144]
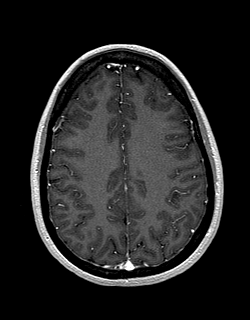
[im 112/144]
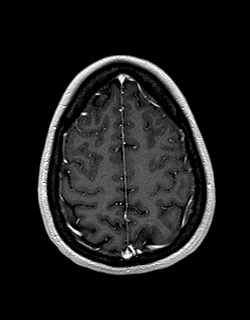
[im 128/144]
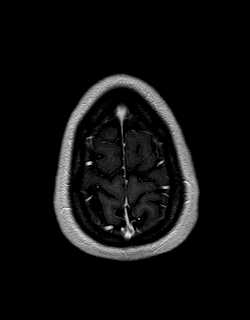
[im 144/144]
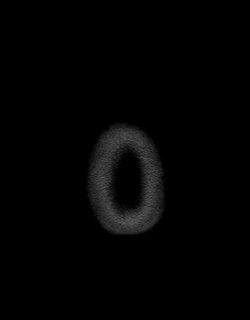

[Series 14: post cor · coronal · 5.0mm · 0.45mm/px · 2 of 28 slices shown]
[im 1/28]
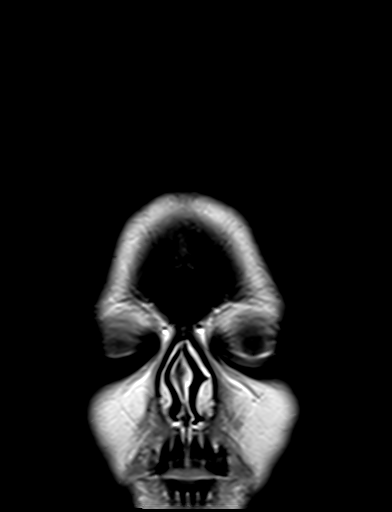
[im 28/28]
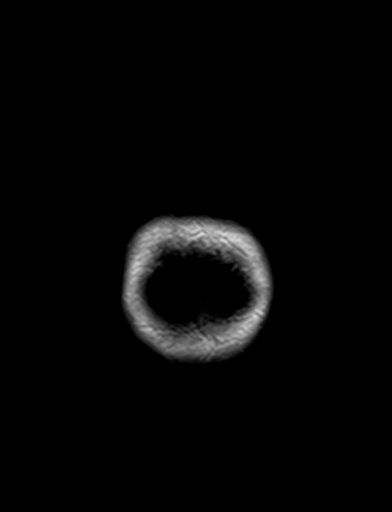

[48 of 48 positions shown; findings below may reference images not displayed]

FINDINGS: Brain: There is no acute infarction or intracranial hemorrhage.
There is no intracranial mass, mass effect, or edema. There is no
hydrocephalus or extra-axial fluid collection. Ventricles and sulci
are normal in size and configuration. No abnormal enhancement.

Vascular: Major vessel flow voids at the skull base are preserved.

Skull and upper cervical spine: Normal marrow signal is preserved.

Sinuses/Orbits: Paranasal sinuses are aerated. Orbits are
unremarkable.

Other: Sella is unremarkable.  Mastoid air cells are clear.
IMPRESSION: Normal MRI of the brain.

## 2022-03-14 ENCOUNTER — Ambulatory Visit: Payer: BC Managed Care – PPO

## 2022-06-06 ENCOUNTER — Encounter: Payer: Self-pay | Admitting: *Deleted

## 2022-06-14 ENCOUNTER — Encounter: Payer: Self-pay | Admitting: Physician Assistant

## 2022-06-14 ENCOUNTER — Ambulatory Visit (INDEPENDENT_AMBULATORY_CARE_PROVIDER_SITE_OTHER): Payer: 59 | Admitting: Physician Assistant

## 2022-06-14 VITALS — BP 110/76 | HR 85 | Temp 97.7°F | Ht 62.0 in | Wt 169.0 lb

## 2022-06-14 DIAGNOSIS — Z111 Encounter for screening for respiratory tuberculosis: Secondary | ICD-10-CM

## 2022-06-14 DIAGNOSIS — Z1159 Encounter for screening for other viral diseases: Secondary | ICD-10-CM

## 2022-06-14 DIAGNOSIS — Z0000 Encounter for general adult medical examination without abnormal findings: Secondary | ICD-10-CM

## 2022-06-14 DIAGNOSIS — E669 Obesity, unspecified: Secondary | ICD-10-CM | POA: Diagnosis not present

## 2022-06-14 DIAGNOSIS — E88819 Insulin resistance, unspecified: Secondary | ICD-10-CM

## 2022-06-14 LAB — CBC WITH DIFFERENTIAL/PLATELET
Basophils Absolute: 0.1 10*3/uL (ref 0.0–0.1)
Basophils Relative: 0.5 % (ref 0.0–3.0)
Eosinophils Absolute: 0.4 10*3/uL (ref 0.0–0.7)
Eosinophils Relative: 3 % (ref 0.0–5.0)
HCT: 37.7 % (ref 36.0–46.0)
Hemoglobin: 11.8 g/dL — ABNORMAL LOW (ref 12.0–15.0)
Lymphocytes Relative: 18.6 % (ref 12.0–46.0)
Lymphs Abs: 2.3 10*3/uL (ref 0.7–4.0)
MCHC: 31.5 g/dL (ref 30.0–36.0)
MCV: 70 fl — ABNORMAL LOW (ref 78.0–100.0)
Monocytes Absolute: 0.5 10*3/uL (ref 0.1–1.0)
Monocytes Relative: 4.3 % (ref 3.0–12.0)
Neutro Abs: 8.9 10*3/uL — ABNORMAL HIGH (ref 1.4–7.7)
Neutrophils Relative %: 73.6 % (ref 43.0–77.0)
Platelets: 328 10*3/uL (ref 150.0–400.0)
RBC: 5.38 Mil/uL — ABNORMAL HIGH (ref 3.87–5.11)
RDW: 14.9 % (ref 11.5–15.5)
WBC: 12.2 10*3/uL — ABNORMAL HIGH (ref 4.0–10.5)

## 2022-06-14 LAB — COMPREHENSIVE METABOLIC PANEL
ALT: 17 U/L (ref 0–35)
AST: 17 U/L (ref 0–37)
Albumin: 4.7 g/dL (ref 3.5–5.2)
Alkaline Phosphatase: 83 U/L (ref 39–117)
BUN: 10 mg/dL (ref 6–23)
CO2: 22 mEq/L (ref 19–32)
Calcium: 9.5 mg/dL (ref 8.4–10.5)
Chloride: 102 mEq/L (ref 96–112)
Creatinine, Ser: 0.73 mg/dL (ref 0.40–1.20)
GFR: 104.16 mL/min (ref >60.00)
Glucose, Bld: 94 mg/dL (ref 70–99)
Potassium: 3.3 mEq/L — ABNORMAL LOW (ref 3.5–5.1)
Sodium: 135 mEq/L (ref 135–145)
Total Bilirubin: 0.5 mg/dL (ref 0.2–1.2)
Total Protein: 8.2 g/dL (ref 6.0–8.3)

## 2022-06-14 LAB — LIPID PANEL
Cholesterol: 126 mg/dL (ref 0–200)
HDL: 48.2 mg/dL (ref >39.00)
LDL Cholesterol: 56 mg/dL (ref 0–99)
NonHDL: 77.47
Total CHOL/HDL Ratio: 3
Triglycerides: 106 mg/dL (ref 0.0–149.0)
VLDL: 21.2 mg/dL (ref 0.0–40.0)

## 2022-06-14 NOTE — Patient Instructions (Signed)
It was great to see you! ? ?Please go to the lab for blood work.  ? ?Our office will call you with your results unless you have chosen to receive results via MyChart. ? ?If your blood work is normal we will follow-up each year for physicals and as scheduled for chronic medical problems. ? ?If anything is abnormal we will treat accordingly and get you in for a follow-up. ? ?Take care, ? ?Yolanda Kelly ?  ? ? ?

## 2022-06-14 NOTE — Progress Notes (Signed)
Subjective:    Yolanda Kelly is a 36 y.o. female and is here for a comprehensive physical exam.  HPI  Prediabetes She is pre-diabetic. A1c by Gynecology in Jan was 5.7%.  Social history: She reports that her father recently got diagnosed with dementia. Her maternal mother has diabetes. She denies alcohol, cigarettes, and tobacco use.   Health Maintenance Due  Topic Date Due   Hepatitis C Screening  Never done   Acute Concerns: None  Chronic Issues: None  Health Maintenance: Immunization: She is not interested in receiving the influenza vaccine during today's visit.  PAP --  Last completed on 03/20/2013.  Diet -- She reports she has a good diet. She is avoiding sweets, but still drinks sodas.  Exercise -- She has started to exercise due to her being pre-diabetic.   Sleep habits -- She reports that her sleep has not been good.  Mood -- Her mood has been normal.   UTD with dentist? - She is UTD with dentist.  UTD with eye doctor? - She is not UTD with eye doctor.   Weight history: Wt Readings from Last 10 Encounters:  06/14/22 169 lb (76.7 kg)  02/08/22 164 lb (74.4 kg)  05/27/21 165 lb (74.8 kg)  05/25/21 156 lb (70.8 kg)  08/12/20 151 lb (68.5 kg)  06/25/20 153 lb (69.4 kg)  04/28/20 150 lb (68 kg)  04/14/20 147 lb 9.6 oz (67 kg)  04/09/20 147 lb 9.6 oz (67 kg)  03/31/20 145 lb (65.8 kg)   Body mass index is 30.91 kg/m. Patient's last menstrual period was 05/21/2022 (exact date).  Alcohol use:  reports no history of alcohol use.  Tobacco use:  Tobacco Use: Low Risk  (06/14/2022)   Patient History    Smoking Tobacco Use: Never    Smokeless Tobacco Use: Never    Passive Exposure: Not on file   Eligible for lung cancer screening? no     06/14/2022    1:05 PM  Depression screen PHQ 2/9  Decreased Interest 0  Down, Depressed, Hopeless 0  PHQ - 2 Score 0     Other providers/specialists: Patient Care Team: [name], [name] as PCP - General  (Physician Assistant) Ginette Otto, [facility] (Obstetrics and Gynecology) Felecia Shelling, DPM as Consulting Physician (Podiatry) [facility] (Dentistry) Drema Dallas, DO as Consulting Physician (Neurology)    PMHx, SurgHx, SocialHx, Medications, and Allergies were reviewed in the Visit Navigator and updated as appropriate.   Past Medical History:  Diagnosis Date   Anxiety    Bell's palsy    OCD (obsessive compulsive disorder)    Thalassanemia 2021     Past Surgical History:  Procedure Laterality Date   BUNIONECTOMY Right      Family History  Problem Relation Age of Onset   Thyroid disease Mother    Kidney cancer Mother    Asthma Father    Irritable bowel syndrome Father    Dementia Father    Heart disease Sister    Celiac disease Sister    Diabetes Mellitus II Maternal Grandmother    Mental illness Maternal Grandfather    Glaucoma Paternal Grandfather    Celiac disease Son        tests high for it, not 100% sure he has it   Breast cancer Maternal Aunt    Colon cancer Neg Hx    Throat cancer Neg Hx    Rectal cancer Neg Hx     Social History  Tobacco Use   Smoking status: Never   Smokeless tobacco: Never  Vaping Use   Vaping Use: Never used  Substance Use Topics   Alcohol use: No   Drug use: No    Review of Systems:   Review of Systems  Constitutional:  Negative for chills, fever, malaise/fatigue and weight loss.  HENT:  Negative for hearing loss, sinus pain and sore throat.   Respiratory:  Negative for cough and hemoptysis.   Cardiovascular:  Negative for chest pain, palpitations, leg swelling and PND.  Gastrointestinal:  Negative for abdominal pain, constipation, diarrhea, heartburn, nausea and vomiting.  Genitourinary:  Negative for dysuria, frequency and urgency.  Musculoskeletal:  Negative for back pain, myalgias and neck pain.  Skin:  Negative for itching and rash.  Neurological:  Negative for dizziness, tingling,  seizures and headaches.  Endo/Heme/Allergies:  Negative for polydipsia.  Psychiatric/Behavioral:  Negative for depression. The patient is not nervous/anxious.     Objective:   BP 110/76 (BP Location: Left Arm, Patient Position: Sitting, Cuff Size: Large)   Pulse 85   Temp 97.7 F (36.5 C) (Temporal)   Ht 5\' 2"  (1.575 m)   Wt 169 lb (76.7 kg)   LMP 05/21/2022 (Exact Date)   SpO2 99%   BMI 30.91 kg/m  Body mass index is 30.91 kg/m.   General Appearance:    Alert, cooperative, no distress, appears stated age  Head:    Normocephalic, without obvious abnormality, atraumatic  Eyes:    PERRL, conjunctiva/corneas clear, EOM's intact, fundi    benign, both eyes  Ears:    Normal TM's and external ear canals, both ears  Nose:   Nares normal, septum midline, mucosa normal, no drainage    or sinus tenderness  Throat:   Lips, mucosa, and tongue normal; teeth and gums normal  Neck:   Supple, symmetrical, trachea midline, no adenopathy;    thyroid:  no enlargement/tenderness/nodules; no carotid   bruit or JVD  Back:     Symmetric, no curvature, ROM normal, no CVA tenderness  Lungs:     Clear to auscultation bilaterally, respirations unlabored  Chest Wall:    No tenderness or deformity   Heart:    Regular rate and rhythm, S1 and S2 normal, no murmur, rub or gallop  Breast Exam:    Deferred  Abdomen:     Soft, non-tender, bowel sounds active all four quadrants,    no masses, no organomegaly  Genitalia:    Deferred  Extremities:   Extremities normal, atraumatic, no cyanosis or edema  Pulses:   2+ and symmetric all extremities  Skin:   Skin color, texture, turgor normal, no rashes or lesions  Lymph nodes:   Cervical, supraclavicular, and axillary nodes normal  Neurologic:   CNII-XII intact, normal strength, sensation and reflexes    throughout    Assessment/Plan:   Routine physical examination Today patient counseled on age appropriate routine health concerns for screening and  prevention, each reviewed and up to date or declined. Immunizations reviewed and up to date or declined. Labs ordered and reviewed. Risk factors for depression reviewed and negative. Hearing function and visual acuity are intact. ADLs screened and addressed as needed. Functional ability and level of safety reviewed and appropriate. Education, counseling and referrals performed based on assessed risks today. Patient provided with a copy of personalized plan for preventive services.  Obesity, unspecified classification, unspecified obesity type, unspecified whether serious comorbidity present Continue efforts at healthy diet and exercise  Insulin resistance Discussed  She plans to follow-up with gyn about this, declines recheck of A1c today   Encounter for screening for other viral diseases Updated today  Screening for tuberculosis Updated today   I,Param Shah,acting as a scribe for [name], PA.,have documented all relevant documentation on the behalf of [name], PA,as directed by  [name], PA while in the presence of [name], [name].  I, [name], [name], have reviewed all documentation for this visit. The documentation on 06/14/22 for the exam, diagnosis, procedures, and orders are all accurate and complete.  [name], PA-C [facility]

## 2022-06-16 ENCOUNTER — Other Ambulatory Visit: Payer: Self-pay | Admitting: Physician Assistant

## 2022-06-16 DIAGNOSIS — D72829 Elevated white blood cell count, unspecified: Secondary | ICD-10-CM

## 2022-06-16 DIAGNOSIS — E876 Hypokalemia: Secondary | ICD-10-CM

## 2022-06-18 LAB — QUANTIFERON-TB GOLD PLUS
Mitogen-NIL: 2.31 IU/mL
NIL: 0.03 IU/mL
QuantiFERON-TB Gold Plus: NEGATIVE
TB1-NIL: 0 IU/mL
TB2-NIL: 0 IU/mL

## 2022-06-18 LAB — HEPATITIS C ANTIBODY: Hepatitis C Ab: NONREACTIVE

## 2022-06-23 ENCOUNTER — Telehealth: Payer: Self-pay | Admitting: *Deleted

## 2022-06-23 NOTE — Telephone Encounter (Signed)
Spoke to pt told her calling to verify fax number you gave me to submit your Health examination form. I have completed it but the fax will not go through and I have tried to different fax machine. Pt looked at her papers and said she will come by and pick them up and take to HR herself. Told her okay I will place at the front desk for you to pick up. Pt verbalized understanding.

## 2022-06-29 ENCOUNTER — Encounter: Payer: Self-pay | Admitting: Physician Assistant

## 2022-09-21 NOTE — Telephone Encounter (Signed)
...  Type of form received: SAME health examination form (HR lost original)  Additional comments:   Received [id number] drop off   Form should be Faxed to:[phone] or [email or web address]  Form should be mailed to:   na Is patient requesting call for pickup: Yes   Form placed:   Sams folder Attach charge sheet.  Yes  Individual made aware of 3-5 business day turn around (Y/N)?  Yes

## 2022-09-27 NOTE — Telephone Encounter (Signed)
Left detailed message on personal voicemail, Health Certificate form has been faxed to the number you gave. I will put copy in your chart, if you have any questions please call the office.

## 2023-05-03 ENCOUNTER — Telehealth: Payer: Self-pay | Admitting: Physician Assistant

## 2023-05-03 NOTE — Telephone Encounter (Signed)
Spoke to pt asked her what labs she is needing physical labs or what? Pt said she is having a problem with her shoulder and knows she was suppose to come back for labs last year and they scheduled her physical for Oct. Told her you will need to schedule a separate visit to have problem done and then do your physical. Pt verbalized understanding said to cancel physical and she will call back to schedule. Appt cancelled.

## 2023-05-03 NOTE — Telephone Encounter (Signed)
Pt needs new lab orders to be put in. She will need them before October. Please advise.

## 2023-05-22 ENCOUNTER — Encounter: Payer: Self-pay | Admitting: Physician Assistant

## 2023-05-22 ENCOUNTER — Ambulatory Visit (INDEPENDENT_AMBULATORY_CARE_PROVIDER_SITE_OTHER): Payer: 59 | Admitting: Physician Assistant

## 2023-05-22 ENCOUNTER — Ambulatory Visit: Payer: 59 | Attending: Physician Assistant

## 2023-05-22 VITALS — BP 124/82 | HR 83 | Temp 97.7°F | Ht 62.0 in | Wt 158.6 lb

## 2023-05-22 DIAGNOSIS — R002 Palpitations: Secondary | ICD-10-CM

## 2023-05-22 DIAGNOSIS — M25512 Pain in left shoulder: Secondary | ICD-10-CM | POA: Diagnosis not present

## 2023-05-22 DIAGNOSIS — R229 Localized swelling, mass and lump, unspecified: Secondary | ICD-10-CM

## 2023-05-22 DIAGNOSIS — G8929 Other chronic pain: Secondary | ICD-10-CM

## 2023-05-22 LAB — CBC WITH DIFFERENTIAL/PLATELET
Basophils Absolute: 0.1 10*3/uL (ref 0.0–0.1)
Basophils Relative: 0.6 % (ref 0.0–3.0)
Eosinophils Absolute: 0.2 10*3/uL (ref 0.0–0.7)
Eosinophils Relative: 1.3 % (ref 0.0–5.0)
HCT: 38.1 % (ref 36.0–46.0)
Hemoglobin: 11.6 g/dL — ABNORMAL LOW (ref 12.0–15.0)
Lymphocytes Relative: 21.9 % (ref 12.0–46.0)
Lymphs Abs: 2.8 10*3/uL (ref 0.7–4.0)
MCHC: 30.5 g/dL (ref 30.0–36.0)
MCV: 71 fl — ABNORMAL LOW (ref 78.0–100.0)
Monocytes Absolute: 0.6 10*3/uL (ref 0.1–1.0)
Monocytes Relative: 5 % (ref 3.0–12.0)
Neutro Abs: 9.2 10*3/uL — ABNORMAL HIGH (ref 1.4–7.7)
Neutrophils Relative %: 71.2 % (ref 43.0–77.0)
Platelets: 360 10*3/uL (ref 150.0–400.0)
RBC: 5.36 Mil/uL — ABNORMAL HIGH (ref 3.87–5.11)
RDW: 15.1 % (ref 11.5–15.5)
WBC: 12.9 10*3/uL — ABNORMAL HIGH (ref 4.0–10.5)

## 2023-05-22 LAB — COMPREHENSIVE METABOLIC PANEL
ALT: 13 U/L (ref 0–35)
AST: 15 U/L (ref 0–37)
Albumin: 4.2 g/dL (ref 3.5–5.2)
Alkaline Phosphatase: 78 U/L (ref 39–117)
BUN: 14 mg/dL (ref 6–23)
CO2: 26 meq/L (ref 19–32)
Calcium: 9.7 mg/dL (ref 8.4–10.5)
Chloride: 105 meq/L (ref 96–112)
Creatinine, Ser: 0.76 mg/dL (ref 0.40–1.20)
GFR: 98.6 mL/min (ref >60.00)
Glucose, Bld: 81 mg/dL (ref 70–99)
Potassium: 4 meq/L (ref 3.5–5.1)
Sodium: 137 meq/L (ref 135–145)
Total Bilirubin: 0.3 mg/dL (ref 0.2–1.2)
Total Protein: 7.7 g/dL (ref 6.0–8.3)

## 2023-05-22 LAB — IBC + FERRITIN
Ferritin: 58.5 ng/mL (ref 10.0–291.0)
Iron: 87 ug/dL (ref 42–145)
Saturation Ratios: 24.8 % (ref 20.0–50.0)
TIBC: 351.4 ug/dL (ref 250.0–450.0)
Transferrin: 251 mg/dL (ref 212.0–360.0)

## 2023-05-22 LAB — TSH: TSH: 1.87 u[IU]/mL (ref 0.35–5.50)

## 2023-05-22 NOTE — Progress Notes (Unsigned)
Enrolled for [employer] to mail a ZIO XT long term holter monitor to the patients address on file.   DOD to read. 

## 2023-05-22 NOTE — Progress Notes (Signed)
Yolanda Kelly is a 37 y.o. female here for a new problem.  History of Present Illness:   Chief Complaint  Patient presents with   Shoulder Pain    Pt stated that she has been having Lt shoulder pain for the past 6years but just 3mos ago it started back up   HPI   Shoulder Pain  She reports experiencing left shoulder pain that has persisted for the past 6 years but has been worsening for the past 3 months.  She states that she often has to rest it and not do any motions for few weeks for the pain to resolve.  She recalls an incident where she had to use her shoulder to open a door, at that time she did experience some pain and wonders if that has caused her discomfort for the past few years.  She does report occasionally having limited mobility due to her pain every now and then. She does report taking an ibuprofen when she's experiencing a flare up but not daily.  Denies numbness and tingling  Mass on arm She does reports having an accompanying lump on her upper arm that has been increasing in size and she is wondering if it's contributing to her shoulder pain. She has seen a dermatologist for this issue and they were not overly concerned  Palpitations She reports experiencing rapid heart rate that she can't seem to control. She's had these episodes while working out at times and while resting at other times.  These episodes tends to occur about once a week or so. She states that her sister has atrial defibrillation and was diagnosed at 7.  She also reports experiencing occasional accompanying heart/ chest pain. She does report taking a multivitamin daily. She states that she does have heavy periods and are unpredictable.   Past Medical History:  Diagnosis Date   Anxiety    Bell's palsy    OCD (obsessive compulsive disorder)    Thalassanemia 2021     Social History   Tobacco Use   Smoking status: Never   Smokeless tobacco: Never  Vaping Use   Vaping status: Never Used   Substance Use Topics   Alcohol use: No   Drug use: No    Past Surgical History:  Procedure Laterality Date   BUNIONECTOMY Right     Family History  Problem Relation Age of Onset   Thyroid disease Mother    Kidney cancer Mother    Asthma Father    Irritable bowel syndrome Father    Dementia Father    Heart disease Sister    Celiac disease Sister    Diabetes Mellitus II Maternal Grandmother    Mental illness Maternal Grandfather    Glaucoma Paternal Grandfather    Celiac disease Son        tests high for it, not 100% sure he has it   Breast cancer Maternal Aunt    Colon cancer Neg Hx    Throat cancer Neg Hx    Rectal cancer Neg Hx     Allergies  Allergen Reactions   Other Anaphylaxis    Coke and red [place], Throat closes up    Current Medications:   Current Outpatient Medications:    Multiple Vitamin (MULTIVITAMIN) tablet, Take 1 tablet by mouth daily., Disp: , Rfl:    ALPRAZolam (XANAX) 0.25 MG tablet, Take 1 tablet (0.25 mg total) by mouth 2 (two) times daily as needed for anxiety. (Patient not taking: Reported on 05/22/2023), Disp: 30 tablet,  Rfl: 1   Review of Systems:   Review of Systems  Cardiovascular:  Positive for palpitations. Negative for chest pain.  Musculoskeletal:        +Shoulder Pain     Vitals:   Vitals:   05/22/23 0914  BP: 124/82  Pulse: 83  Temp: 97.7 F (36.5 C)  SpO2: 98%  Weight: 158 lb 9.6 oz (71.9 kg)  Height: 5\' 2"  (1.575 m)     Body mass index is 29.01 kg/m.  Physical Exam:   Physical Exam Vitals and nursing note reviewed.  Constitutional:      General: She is not in acute distress.    Appearance: She is well-developed. She is not ill-appearing or toxic-appearing.  Cardiovascular:     Rate and Rhythm: Normal rate and regular rhythm.     Pulses: Normal pulses.     Heart sounds: Normal heart sounds, S1 normal and S2 normal.  Pulmonary:     Effort: Pulmonary effort is normal.     Breath sounds: Normal breath sounds.   Musculoskeletal:     Comments: Normal range of motion of left shoulder No tenderness to palpation of left shoulder  Skin:    General: Skin is warm and dry.     Comments: Palpable mass to proximal aspect of left humerus  Neurological:     Mental Status: She is alert.     GCS: GCS eye subscore is 4. GCS verbal subscore is 5. GCS motor subscore is 6.     Comments: Grip strength 5 out of 5 bilaterally  Psychiatric:        Speech: Speech normal.        Behavior: Behavior normal. Behavior is cooperative.     Assessment and Plan:   Chronic left shoulder pain Unclear etiology Discussed option to pursue physical therapy, sports medicine evaluation, imaging Discussed to trial home exercises and see if this helps her symptoms and if not may pursue additional workup I gave her a list of exercises to try to strengthen her shoulder to see if this could help with symptoms If symptoms do not improve or completely resolve asked her to let us know and we will pursue the next appropriate step  Heart palpitations Unclear etiology Will order a Zio patch for further evaluation and update blood work to rule out organic cause of symptoms If any worsening symptoms or obvious abnormalities on Zio patch will refer to cardiology  Localized skin mass, lump, or swelling Will obtain ultrasound for further evaluation since area is enlarging  I,Safa M Kadhim,acting as a scribe for [name], PA.,have documented all relevant documentation on the behalf of [name], PA,as directed by  [name], PA while in the presence of [name], [name].  I, [name], [name], have reviewed all documentation for this visit. The documentation on 05/22/23 for the exam, diagnosis, procedures, and orders are all accurate and complete.   [name], PA-C

## 2023-05-23 ENCOUNTER — Encounter: Payer: Self-pay | Admitting: Physician Assistant

## 2023-05-23 ENCOUNTER — Ambulatory Visit: Payer: 59 | Admitting: Physician Assistant

## 2023-05-23 VITALS — BP 113/84 | HR 74

## 2023-05-23 DIAGNOSIS — F411 Generalized anxiety disorder: Secondary | ICD-10-CM | POA: Diagnosis not present

## 2023-05-23 DIAGNOSIS — F429 Obsessive-compulsive disorder, unspecified: Secondary | ICD-10-CM

## 2023-05-23 MED ORDER — ALPRAZOLAM 0.25 MG PO TABS: 0.2500 mg | ORAL_TABLET | Freq: Two times a day (BID) | ORAL | 1 refills | Status: DC | PRN

## 2023-05-23 MED ORDER — HYDROXYZINE HCL 10 MG PO TABS
5.0000 mg | ORAL_TABLET | Freq: Three times a day (TID) | ORAL | 1 refills | Status: DC | PRN
Start: 2023-05-23 — End: 2023-09-22

## 2023-05-23 NOTE — Progress Notes (Signed)
[facility]  Patient ID: Yolanda Kelly,  MRN: [id number]  PCP: [name], PA  Date of Evaluation: 05/23/2023 Time spent: 22 minutes  Chief Complaint:  Chief Complaint   Anxiety    HISTORY/CURRENT STATUS: HPI  More anxious  Has more anxiety lately. Past few months has been more anxious in general, "having pre-PA" at times, feels one coming on and is able to calm herself down before it gets bad.  Then she might start obsessing over things, she cannot get them out of her head.  It feels like a vicious cycle.  She asks if there is anything like propranolol or something else that she can take as needed.  In the past she took Xanax, but she has always been very hesitant to take it.  She is concerned because it is a controlled substance.  She does not like taking medications at all but since it is a controlled substance it is even worse.  Her husband takes propranolol.  She has taken it once and thinks it helped.  She does not remember any side effects from it. Isn't asking for that drug specifically but doesn't want to take anything routinely if possible.  She has tried several different medications like BuSpar and possibly Luvox in the past and did not like the way they made her feel.  Patient is able to enjoy things.  Energy and motivation are good.  Work is going well.   No extreme sadness, tearfulness, or feelings of hopelessness.  Sometimes she has trouble falling asleep because she cannot get her mind to turn off.  ADLs and personal hygiene are normal.   Denies any changes in concentration, making decisions, or remembering things.  Appetite has not changed.  Weight is stable.   Denies suicidal or homicidal thoughts.  Patient denies increased energy with decreased need for sleep, increased talkativeness, racing thoughts, impulsivity or risky behaviors, increased spending, increased libido, grandiosity, increased irritability or anger, paranoia, or hallucinations.  Denies  dizziness, syncope, seizures, numbness, tingling, tremor, tics, unsteady gait, slurred speech, confusion. Denies muscle or joint pain, stiffness, or dystonia.  Individual Medical History/ Review of Systems: Changes? :No    Past medications for mental health diagnoses include: Xanax, Unisom, Sonata worked for a while but then it quit.  Buspar caused h/a and nausea, Trazodone caused nightmares, Luvox ?  Allergies: Other  Current Medications:  Current Outpatient Medications:    cetirizine (ZYRTEC) 10 MG tablet, Take 10 mg by mouth daily., Disp: , Rfl:    hydrOXYzine (ATARAX) 10 MG tablet, Take 0.5-2 tablets (5-20 mg total) by mouth 3 (three) times daily as needed., Disp: 60 tablet, Rfl: 1   Multiple Vitamin (MULTIVITAMIN) tablet, Take 1 tablet by mouth daily., Disp: , Rfl:    ALPRAZolam (XANAX) 0.25 MG tablet, Take 1 tablet (0.25 mg total) by mouth 2 (two) times daily as needed for anxiety., Disp: 30 tablet, Rfl: 1 Medication Side Effects: none  Family Medical/ Social History: Changes?  No  MENTAL HEALTH EXAM:  Blood pressure 113/84, pulse 74.There is no height or weight on file to calculate BMI.  General Appearance: Casual, Neat and Well Groomed  Eye Contact:  Good  Speech:  Clear and Coherent  Volume:  Normal  Mood:  Anxious  Affect:  Anxious  Thought Process:  Goal Directed and Descriptions of Associations: Intact  Orientation:  Full (Time, Place, and Person)  Thought Content: Logical   Suicidal Thoughts:  No  Homicidal Thoughts:  No  Memory:  WNL  Judgement:  Good  Insight:  Good  Psychomotor Activity:  Normal  Concentration:  Concentration: Good and Attention Span: Good  Recall:  Good  Fund of Knowledge: Good  Language: Good  Assets:  Desire for Improvement  ADL's:  Intact  Cognition: WNL  Prognosis:  Good   DIAGNOSES:    ICD-10-CM   1. Obsessive-compulsive disorder, unspecified type  F42.9     2. Generalized anxiety disorder  F41.1      Receiving Psychotherapy:  No In the past with Stevphen Meuse, [facility]  RECOMMENDATIONS:  PDMP reviewed.  No controlled substances.  I provided 22 minutes of face to face time during this encounter, including time spent before and after the visit in records review, medical decision making, counseling pertinent to today's visit, and charting.   We discussed different options including propranolol, hydroxyzine, and going back on the Xanax.  She did not have any side effects from the Xanax but was always very hesitant to take it even when much needed.  After discussing the propranolol and hydroxyzine including benefits, risk and possible side effects that she would like to try hydroxyzine.  That is fine, I will also give her a prescription for low-dose Xanax that she can use if hydroxyzine is ineffective or causes too much sedation.  She verbalizes understanding.  We also discussed having her go on an antidepressant that will help anxiety and OCD symptoms.  She prefers not to at this time, this is fine, however if she is having to take the rescue medications multiple times a day then I recommend we retry an SSRI or clomipramine.  Avoid caffeine if it seems to be making her worse.  Restart Xanax 0.25 mg, 1 p.o. twice daily as needed anxiety. Start hydroxyzine 10 mg, 1/2-2 p.o. 3 times daily as needed anxiety. Return in 4 weeks.  [name], PA-C

## 2023-05-24 ENCOUNTER — Ambulatory Visit
Admission: RE | Admit: 2023-05-24 | Discharge: 2023-05-24 | Disposition: A | Discharge status: Home / Self Care | Payer: 59 | Source: Ambulatory Visit | Attending: Physician Assistant | Admitting: Physician Assistant

## 2023-05-24 DIAGNOSIS — R002 Palpitations: Secondary | ICD-10-CM | POA: Diagnosis not present

## 2023-05-24 DIAGNOSIS — R229 Localized swelling, mass and lump, unspecified: Secondary | ICD-10-CM

## 2023-06-20 ENCOUNTER — Encounter: Payer: Self-pay | Admitting: Physician Assistant

## 2023-06-20 ENCOUNTER — Ambulatory Visit: Payer: 59 | Admitting: Physician Assistant

## 2023-06-20 DIAGNOSIS — F411 Generalized anxiety disorder: Secondary | ICD-10-CM | POA: Diagnosis not present

## 2023-06-20 DIAGNOSIS — G4700 Insomnia, unspecified: Secondary | ICD-10-CM

## 2023-06-20 DIAGNOSIS — F429 Obsessive-compulsive disorder, unspecified: Secondary | ICD-10-CM | POA: Diagnosis not present

## 2023-06-20 NOTE — Progress Notes (Signed)
[facility]  Patient ID: Yolanda Kelly,  MRN: [id number]  PCP: [name], PA  Date of Evaluation: 06/20/2023 Time spent: 24 minutes  Chief Complaint:  Chief Complaint   Anxiety; Follow-up    HISTORY/CURRENT STATUS: HPI  For f/u anxiety.  Is a lot better since she restarted Xanax and hydroxyzine was added.  Not having panic attacks but does still get overwhelmed at times.  Still has ruminating thoughts sometimes but not common.  She has only taken the Xanax 3 times in the past month.  It has helped the anxiety and does not cause drowsiness.  If she takes the hydroxyzine during the day she does get very drowsy.  She has only been taking 5 mg, mostly in the evening now and that helps her sleep.  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  She sometimes wakes up during the middle of the night and is anxious, not sure why.  It can be a little hard to go back to sleep.  She has taken Sonata a few times in the past and it was effective.  Patient is able to enjoy things.  Energy and motivation are good.  Work is going well.   No extreme sadness, tearfulness, or feelings of hopelessness. ADLs and personal hygiene are normal.   Denies any changes in concentration, making decisions, or remembering things.  Appetite has not changed.  Weight is stable.  Denies suicidal or homicidal thoughts.  Patient denies increased energy with decreased need for sleep, increased talkativeness, racing thoughts, impulsivity or risky behaviors, increased spending, increased libido, grandiosity, increased irritability or anger, paranoia, or hallucinations.  Denies dizziness, syncope, seizures, numbness, tingling, tremor, tics, unsteady gait, slurred speech, confusion. Denies muscle or joint pain, stiffness, or dystonia.  Individual Medical History/ Review of Systems: Changes? :No    Past medications for mental health diagnoses  include: Xanax, Unisom, Sonata worked for a while but then it quit.  Buspar caused h/a and nausea, Trazodone caused nightmares, Luvox ?  Allergies: Other  Current Medications:  Current Outpatient Medications:    ALPRAZolam (XANAX) 0.25 MG tablet, Take 1 tablet (0.25 mg total) by mouth 2 (two) times daily as needed for anxiety., Disp: 30 tablet, Rfl: 1   hydrOXYzine (ATARAX) 10 MG tablet, Take 0.5-2 tablets (5-20 mg total) by mouth 3 (three) times daily as needed., Disp: 60 tablet, Rfl: 1   Multiple Vitamin (MULTIVITAMIN) tablet, Take 1 tablet by mouth daily., Disp: , Rfl:    cetirizine (ZYRTEC) 10 MG tablet, Take 10 mg by mouth daily. (Patient not taking: Reported on 06/20/2023), Disp: , Rfl:  Medication Side Effects: none  Family Medical/ Social History: Changes?  No  MENTAL HEALTH EXAM:  There were no vitals taken for this visit.There is no height or weight on file to calculate BMI.  General Appearance: Casual, Neat and Well Groomed  Eye Contact:  Good  Speech:  Clear and Coherent  Volume:  Normal  Mood:  Euthymic  Affect:  Congruent  Thought Process:  Goal Directed and Descriptions of Associations: Intact  Orientation:  Full (Time, Place, and Person)  Thought Content: Logical   Suicidal Thoughts:  No  Homicidal Thoughts:  No  Memory:  WNL  Judgement:  Good  Insight:  Good  Psychomotor Activity:  Normal  Concentration:  Concentration: Good and Attention Span: Good  Recall:  Good  Fund of Knowledge: Good  Language:  Good  Assets:  Desire for Improvement  ADL's:  Intact  Cognition: WNL  Prognosis:  Good   DIAGNOSES:    ICD-10-CM   1. Generalized anxiety disorder  F41.1     2. Obsessive-compulsive disorder, unspecified type  F42.9     3. Insomnia, unspecified type  G47.00       Receiving Psychotherapy: No In the past with Stevphen Meuse, [facility]  RECOMMENDATIONS:  PDMP reviewed.  Xanax filled 05/23/2023. I provided 24 minutes of face to face time during this encounter,  including time spent before and after the visit in records review, medical decision making, counseling pertinent to today's visit, and charting.   I am glad to see her doing better so no change in treatment at this time.  I provided approximately 18 minutes counseling on sleep hygiene.  We talked about the [place].  I recommend she take a Xanax if she wakes up during the night and is anxious.  That should help with relaxation show she can go back to sleep.  If that is not effective then we can restart the Sonata.  I prefer to not have her on 2 sedatives.  Continue Xanax 0.25 mg, 1 p.o. twice daily as needed anxiety. Continue hydroxyzine 10 mg, 1/2-2 p.o. 3 times daily as needed anxiety. Return in 3 months.  [name], PA-C

## 2023-07-05 ENCOUNTER — Encounter: Payer: 59 | Admitting: Physician Assistant

## 2023-09-20 ENCOUNTER — Ambulatory Visit (INDEPENDENT_AMBULATORY_CARE_PROVIDER_SITE_OTHER): Payer: 59 | Admitting: Physician Assistant

## 2023-09-22 ENCOUNTER — Ambulatory Visit: Payer: 59 | Admitting: Physician Assistant

## 2023-09-22 ENCOUNTER — Encounter: Payer: Self-pay | Admitting: Physician Assistant

## 2023-09-22 DIAGNOSIS — F429 Obsessive-compulsive disorder, unspecified: Secondary | ICD-10-CM | POA: Diagnosis not present

## 2023-09-22 DIAGNOSIS — F411 Generalized anxiety disorder: Secondary | ICD-10-CM | POA: Diagnosis not present

## 2023-09-22 DIAGNOSIS — G4700 Insomnia, unspecified: Secondary | ICD-10-CM

## 2023-09-22 MED ORDER — HYDROXYZINE HCL 10 MG PO TABS: 5.0000 mg | ORAL_TABLET | Freq: Three times a day (TID) | ORAL | 1 refills | Status: DC | PRN

## 2023-09-22 NOTE — Progress Notes (Signed)
 [facility]  Patient ID: Yolanda Kelly,  MRN: [id number]  PCP: Job Lukes, PA  Date of Evaluation: 09/22/2023 Time spent:20 minutes  Chief Complaint:  Chief Complaint   Anxiety; Follow-up    HISTORY/CURRENT STATUS: HPI  For f/u anxiety.  Anxiety is a lot better. Not needing the Xanax  often.  Does use the Hydroxyzine  sometimes. No PA. 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  Not having as many obsessive or ruminating thoughts.  No compulsions.  She still has some trouble sleeping.  The hydroxyzine  does help.  She usually takes 10 mg and it helps her fall asleep.  She still having trouble staying asleep though.  She tends to wake up a lot and feels like she sleeps restlessly.  But she is able to go back to sleep quicker than she had been.  She practices good sleep hygiene already.  Patient is able to enjoy things.  Energy and motivation are good.  Work is going well.   No extreme sadness, tearfulness, or feelings of hopelessness. ADLs and personal hygiene are normal.   Denies any changes in concentration, making decisions, or remembering things.  Appetite has not changed.  Weight is stable.   Denies suicidal or homicidal thoughts.  Denies dizziness, syncope, seizures, numbness, tingling, tremor, tics, unsteady gait, slurred speech, confusion. Denies muscle or joint pain, stiffness, or dystonia.  Individual Medical History/ Review of Systems: Changes? :No    Past medications for mental health diagnoses include: Xanax , Unisom, Sonata  worked for a while but then it quit.  Buspar  caused h/a and nausea, Trazodone  caused nightmares, Luvox  ?  Allergies: Other  Current Medications:  Current Outpatient Medications:    ALPRAZolam  (XANAX ) 0.25 MG tablet, Take 1 tablet (0.25 mg total) by mouth 2 (two) times daily as needed for anxiety., Disp: 30 tablet,  Rfl: 1   hydrOXYzine  (ATARAX ) 10 MG tablet, Take 0.5-2 tablets (5-20 mg total) by mouth 3 (three) times daily as needed., Disp: 60 tablet, Rfl: 1   cetirizine (ZYRTEC) 10 MG tablet, Take 10 mg by mouth daily. (Patient not taking: Reported on 09/22/2023), Disp: , Rfl:    Multiple Vitamin (MULTIVITAMIN) tablet, Take 1 tablet by mouth daily. (Patient not taking: Reported on 09/22/2023), Disp: , Rfl:  Medication Side Effects: none  Family Medical/ Social History: Changes?  No  MENTAL HEALTH EXAM:  There were no vitals taken for this visit.There is no height or weight on file to calculate BMI.  General Appearance: Casual, Neat and Well Groomed  Eye Contact:  Good  Speech:  Clear and Coherent  Volume:  Normal  Mood:  Euthymic  Affect:  Congruent  Thought Process:  Goal Directed and Descriptions of Associations: Intact  Orientation:  Full (Time, Place, and Person)  Thought Content: Logical   Suicidal Thoughts:  No  Homicidal Thoughts:  No  Memory:  WNL  Judgement:  Good  Insight:  Good  Psychomotor Activity:  Normal  Concentration:  Concentration: Good and Attention Span: Good  Recall:  Good  Fund of Knowledge: Good  Language: Good  Assets:  Communication Skills Desire for Improvement Financial Resources/Insurance Housing Transportation Vocational/Educational  ADL's:  Intact  Cognition: WNL  Prognosis:  Good   DIAGNOSES:    ICD-10-CM   1. Generalized anxiety disorder  F41.1     2. Obsessive-compulsive disorder, unspecified type  F42.9  3. Insomnia, unspecified type  G47.00       Receiving Psychotherapy: No In the past with Silvano Pacini, [facility]  RECOMMENDATIONS:  PDMP reviewed.  Xanax  filled 08/05/2023. I provided 20  minutes of face to face time during this encounter, including time spent before and after the visit in records review, medical decision making, counseling pertinent to today's visit, and charting.   We again discussed sleep hygiene.  She can increase the  hydroxyzine  up to 3 pills (30 mg) nightly as needed sleep.  Overall she is doing well so no changes will be made.  Continue Xanax  0.25 mg, 1 p.o. twice daily as needed anxiety. Continue hydroxyzine  10 mg, 1/2-3 p.o. 3 times daily as needed anxiety. Return in 4 months.  Verneita Cooks, PA-C

## 2023-11-23 ENCOUNTER — Encounter: Payer: Self-pay | Admitting: Physician Assistant

## 2023-11-23 ENCOUNTER — Ambulatory Visit (INDEPENDENT_AMBULATORY_CARE_PROVIDER_SITE_OTHER): Admitting: Physician Assistant

## 2023-11-23 VITALS — BP 100/72 | HR 72 | Temp 98.2°F | Ht 62.0 in | Wt 151.6 lb

## 2023-11-23 DIAGNOSIS — K623 Rectal prolapse: Secondary | ICD-10-CM

## 2023-11-23 NOTE — Progress Notes (Signed)
 Yolanda Kelly is a 38 y.o. female here for a new problem.  History of Present Illness:   Chief Complaint  Patient presents with   Rectal Pain    States that it has been going on for 8 years has had colonoscopy and another test but does not remember name of test.     HPI  Rectal prolapse Pt was seen by [facility] on 06/18/2014 around the first onset of sx.   She had in x-ray defecography done with the following results Impression:  1. There is mild laxity of the middle and posterior compartments with a  small anterior rectocele and pubococcygeal dysfunction during squeeze.    She was also seen by Donnetta Hutching, MD on 07/24/2014 - See recommendations/plan below: 1. I recommend conservation care with topical zinc oxide as needed for symptoms.  2. Treatment alternatives were discussed. Resection would be similar to a hemorrhoidectomy and is not indicated with her daily care of a 17 month old baby and questionable future pregnancies. She is not bothered by this. She was sent in because her Gyn wanted me to consult. She doesn't want or have time for a benign, elective procedure right now. She'll be intouch if she wants an operation in the future.   Today, she reports her rectal pain has worsened more recently, endorsing daily pain. Discomfort worsens with constipation.  She tends to do what she can to avoid any constipation.  Pt has to physically put her rectum back in after every bowel movement  After every bowel movement pt has to push her rectum back in.  She reports her rectal prolapse is about a half inch.  States she can feel it while sitting down Has tried Kegel exercises.  Has not tried any pelvic floor PT.  Denies any hemorrhoids.   Past Medical History:  Diagnosis Date   Anxiety    Bell's palsy    OCD (obsessive compulsive disorder)    Thalassanemia 2021     Social History   Tobacco Use   Smoking status: Never   Smokeless tobacco: Never  Vaping Use   Vaping status:  Never Used  Substance Use Topics   Alcohol use: No   Drug use: No    Past Surgical History:  Procedure Laterality Date   BUNIONECTOMY Right     Family History  Problem Relation Age of Onset   Thyroid disease Mother    Kidney cancer Mother    Asthma Father    Irritable bowel syndrome Father    Dementia Father    Heart disease Sister    Celiac disease Sister    Diabetes Mellitus II Maternal Grandmother    Mental illness Maternal Grandfather    Glaucoma Paternal Grandfather    Celiac disease Son        tests high for it, not 100% sure he has it   Breast cancer Maternal Aunt    Colon cancer Neg Hx    Throat cancer Neg Hx    Rectal cancer Neg Hx     Allergies  Allergen Reactions   Other Anaphylaxis    Coke and red mt. Dew, Throat closes up    Current Medications:   Current Outpatient Medications:    ALPRAZolam (XANAX) 0.25 MG tablet, Take 1 tablet (0.25 mg total) by mouth 2 (two) times daily as needed for anxiety., Disp: 30 tablet, Rfl: 1   cetirizine (ZYRTEC) 10 MG tablet, Take 10 mg by mouth daily., Disp: , Rfl:    hydrOXYzine (  ATARAX) 10 MG tablet, Take 0.5-3 tablets (5-30 mg total) by mouth 3 (three) times daily as needed., Disp: 60 tablet, Rfl: 1   Multiple Vitamin (MULTIVITAMIN) tablet, Take 1 tablet by mouth daily. (Patient not taking: Reported on 11/23/2023), Disp: , Rfl:    Review of Systems:   Negative unless otherwise specified per HPI.  Vitals:   Vitals:   11/23/23 1029  BP: 100/72  Pulse: 72  Temp: 98.2 F (36.8 C)  TempSrc: Temporal  SpO2: 98%  Weight: 151 lb 9.6 oz (68.8 kg)  Height: 5\' 2"  (1.575 m)     Body mass index is 27.73 kg/m.  Physical Exam:   Physical Exam Constitutional:      Appearance: Normal appearance. She is well-developed.  HENT:     Head: Normocephalic and atraumatic.  Eyes:     General: Lids are normal.     Extraocular Movements: Extraocular movements intact.     Conjunctiva/sclera: Conjunctivae normal.  Pulmonary:      Effort: Pulmonary effort is normal.  Musculoskeletal:        General: Normal range of motion.     Cervical back: Normal range of motion and neck supple.  Skin:    General: Skin is warm and dry.  Neurological:     Mental Status: She is alert and oriented to person, place, and time.  Psychiatric:        Attention and Perception: Attention and perception normal.        Mood and Affect: Mood normal.        Behavior: Behavior normal.        Thought Content: Thought content normal.        Judgment: Judgment normal.     Assessment and Plan:   1. Rectal mucosa prolapse (Primary) - Ambulatory referral to Colorectal Surgery Will refer her to colorectal surgery for further evaluation management of this condition  I, Isabelle Course, acting as a Neurosurgeon for [name], [name]., have documented all relevant documentation on the behalf of [name], [name], as directed by  [name], PA while in the presence of [name], [name].  I, [name], [name], have reviewed all documentation for this visit. The documentation on 11/23/23 for the exam, diagnosis, procedures, and orders are all accurate and complete.  [name], PA-C

## 2023-11-23 NOTE — Patient Instructions (Signed)
 It was great to see you!  We have referred you to  [facility]- Address: [address] [place] [address] Phone: [phone] Fax: [phone]   Take care,  [name] PA-C

## 2023-12-26 ENCOUNTER — Ambulatory Visit: Payer: Self-pay | Admitting: Surgery

## 2024-01-22 ENCOUNTER — Ambulatory Visit: Payer: 59 | Admitting: Physician Assistant

## 2024-01-29 ENCOUNTER — Encounter: Payer: Self-pay | Admitting: Physician Assistant

## 2024-01-29 ENCOUNTER — Ambulatory Visit: Admitting: Physician Assistant

## 2024-01-29 DIAGNOSIS — F429 Obsessive-compulsive disorder, unspecified: Secondary | ICD-10-CM

## 2024-01-29 DIAGNOSIS — F411 Generalized anxiety disorder: Secondary | ICD-10-CM | POA: Diagnosis not present

## 2024-01-29 DIAGNOSIS — G4700 Insomnia, unspecified: Secondary | ICD-10-CM

## 2024-01-29 MED ORDER — ALPRAZOLAM 0.25 MG PO TABS: 0.2500 mg | ORAL_TABLET | Freq: Two times a day (BID) | ORAL | 1 refills | Status: DC | PRN

## 2024-01-29 NOTE — Progress Notes (Signed)
 [facility]  Patient ID: Yolanda Kelly,  MRN: [id number]  PCP: Alexander Iba, PA  Date of Evaluation: 01/29/2024 Time spent:25 minutes  Chief Complaint:  Chief Complaint   Anxiety; Insomnia; Follow-up    HISTORY/CURRENT STATUS: HPI  For routine med check, insomnia is really bad.   Trouble sleeping has been an issue for a long time, seems worse now.  Getting about 5-6 hours max, but 1-2 nights she might sleep 10 hours.  Wakes up feeling tired every morning. Hydroxyzine  helps some but it's hit or miss. Feels like she sleeps better with it but still wakes up a lot during the time. Reads in bed but no other habits that are bad. No caffeine after 4 PM, but it's not common.    Anxiety is mostly controlled.  She still has problems with OCD but is able to control it usually.  Her mind races a lot, mostly at night, that is the reason she cannot fall asleep sometimes.  She rarely takes the Xanax .  Is afraid of becoming dependent on it.  Patient is able to enjoy things.  Energy and motivation are good.  Work is going well.   No extreme sadness, tearfulness, or feelings of hopelessness.  ADLs and personal hygiene are normal.   Denies any changes in concentration, making decisions, or remembering things.  Appetite has not changed.  Weight is stable.   Denies suicidal or homicidal thoughts.  Patient denies increased energy with decreased need for sleep, increased talkativeness, racing thoughts, impulsivity or risky behaviors, increased spending, increased libido, grandiosity, increased irritability or anger, paranoia, or hallucinations.  Denies dizziness, syncope, seizures, numbness, tingling, tremor, tics, unsteady gait, slurred speech, confusion. Denies muscle or joint pain, stiffness, or dystonia.  Individual Medical History/ Review of Systems: Changes? :No    Past medications for mental health diagnoses include: Xanax , Unisom caused extreme , Sonata  worked for a while but then it  quit.  Buspar  caused h/a and nausea, Trazodone  caused nightmares, Luvox  ?  Allergies: Other  Current Medications:  Current Outpatient Medications:    hydrOXYzine  (ATARAX ) 10 MG tablet, Take 0.5-3 tablets (5-30 mg total) by mouth 3 (three) times daily as needed., Disp: 60 tablet, Rfl: 1   Multiple Vitamin (MULTIVITAMIN) tablet, Take 1 tablet by mouth daily., Disp: , Rfl:    Multiple Vitamins-Minerals (HAIR/SKIN/NAILS/BIOTIN PO), Take by mouth., Disp: , Rfl:    ALPRAZolam  (XANAX ) 0.25 MG tablet, Take 1-2 tablets (0.25-0.5 mg total) by mouth 2 (two) times daily as needed for anxiety. Or sleep, Disp: 60 tablet, Rfl: 1   cetirizine (ZYRTEC) 10 MG tablet, Take 10 mg by mouth daily. (Patient not taking: Reported on 01/29/2024), Disp: , Rfl:  Medication Side Effects: none  Family Medical/ Social History: Changes?  No  MENTAL HEALTH EXAM:  There were no vitals taken for this visit.There is no height or weight on file to calculate BMI.  General Appearance: Casual, Neat and Well Groomed  Eye Contact:  Good  Speech:  Clear and Coherent  Volume:  Normal  Mood:  Euthymic  Affect:  Congruent  Thought Process:  Goal Directed and Descriptions of Associations: Intact  Orientation:  Full (Time, Place, and Person)  Thought Content: Logical   Suicidal Thoughts:  No  Homicidal Thoughts:  No  Memory:  WNL  Judgement:  Good  Insight:  Good  Psychomotor Activity:  Normal  Concentration:  Concentration: Good and Attention Span: Good  Recall:  Good  Fund of Knowledge: Good  Language: Good  Assets:  Communication Skills Desire for Improvement Financial Resources/Insurance Housing Resilience Transportation Vocational/Educational  ADL's:  Intact  Cognition: WNL  Prognosis:  Good   DIAGNOSES:    ICD-10-CM   1. Generalized anxiety disorder  F41.1     2. Insomnia, unspecified type  G47.00     3. Obsessive-compulsive disorder, unspecified type  F42.9       Receiving Psychotherapy: No In the  past with Marlise Simpers, [facility]  RECOMMENDATIONS:  PDMP reviewed.  Xanax  filled 08/05/2023. I provided 25 minutes of face to face time during this encounter, including time spent before and after the visit in records review, medical decision making, counseling pertinent to today's visit, and charting.   I spent approximately 18 minutes in counseling concerning sleep hygiene.  She is already practicing good hygiene, we discussed reading in bed, that may be keeping her awake so she will stop that to see if it is beneficial.  She has the Xanax  already so I recommend taking 1-2 every night, about the same time for at least 3-4 nights in a row to see how effective that will be.  Then she can take it as needed.  She has taken Sonata  in the past and it was effective so we can retry that if needed.  Other options would be Ambien, Lunesta, or mirtazapine.  We briefly discussed all of those.  If needed we can send in Sonata  prior to our next visit.  Continue Xanax  0.25 mg, 1-2 p.o. twice daily as needed anxiety or sleep. Continue hydroxyzine  10 mg, 1/2-3 p.o. 3 times daily as needed anxiety. Return in 6 to 8 weeks.  Marvia Slocumb, PA-C

## 2024-01-31 ENCOUNTER — Ambulatory Visit (INDEPENDENT_AMBULATORY_CARE_PROVIDER_SITE_OTHER): Admitting: Physician Assistant

## 2024-01-31 ENCOUNTER — Ambulatory Visit: Payer: Self-pay | Admitting: Physician Assistant

## 2024-01-31 ENCOUNTER — Encounter: Payer: Self-pay | Admitting: Physician Assistant

## 2024-01-31 VITALS — BP 112/70 | HR 61 | Temp 98.2°F | Ht 62.0 in | Wt 154.0 lb

## 2024-01-31 DIAGNOSIS — L659 Nonscarring hair loss, unspecified: Secondary | ICD-10-CM

## 2024-01-31 DIAGNOSIS — K642 Third degree hemorrhoids: Secondary | ICD-10-CM

## 2024-01-31 LAB — CBC WITH DIFFERENTIAL/PLATELET
Basophils Absolute: 0.1 10*3/uL (ref 0.0–0.1)
Basophils Relative: 0.7 % (ref 0.0–3.0)
Eosinophils Absolute: 0.1 10*3/uL (ref 0.0–0.7)
Eosinophils Relative: 1.4 % (ref 0.0–5.0)
HCT: 37.7 % (ref 36.0–46.0)
Hemoglobin: 11.9 g/dL — ABNORMAL LOW (ref 12.0–15.0)
Lymphocytes Relative: 26.4 % (ref 12.0–46.0)
Lymphs Abs: 2.2 10*3/uL (ref 0.7–4.0)
MCHC: 31.6 g/dL (ref 30.0–36.0)
MCV: 69.8 fl — ABNORMAL LOW (ref 78.0–100.0)
Monocytes Absolute: 0.5 10*3/uL (ref 0.1–1.0)
Monocytes Relative: 6 % (ref 3.0–12.0)
Neutro Abs: 5.4 10*3/uL (ref 1.4–7.7)
Neutrophils Relative %: 65.5 % (ref 43.0–77.0)
Platelets: 357 10*3/uL (ref 150.0–400.0)
RBC: 5.4 Mil/uL — ABNORMAL HIGH (ref 3.87–5.11)
RDW: 14.9 % (ref 11.5–15.5)
WBC: 8.3 10*3/uL (ref 4.0–10.5)

## 2024-01-31 LAB — TSH: TSH: 1.28 u[IU]/mL (ref 0.35–5.50)

## 2024-01-31 LAB — IBC + FERRITIN
Ferritin: 66.6 ng/mL (ref 10.0–291.0)
Iron: 92 ug/dL (ref 42–145)
Saturation Ratios: 26.3 % (ref 20.0–50.0)
TIBC: 350 ug/dL (ref 250.0–450.0)
Transferrin: 250 mg/dL (ref 212.0–360.0)

## 2024-01-31 LAB — T3, FREE: T3, Free: 3 pg/mL (ref 2.3–4.2)

## 2024-01-31 LAB — T4, FREE: Free T4: 0.85 ng/dL (ref 0.60–1.60)

## 2024-01-31 NOTE — Progress Notes (Signed)
 Yolanda Kelly is a 38 y.o. female here for a new problem.  History of Present Illness:   Chief Complaint  Patient presents with   Alopecia    Pt c/o hair loss gradually about 6 months ago, worse in the past month. She wants her thyroid  checked.   Alopecia: Pt complains of hair loss, ongoing 6 months but worsening in the last month.  States her hair falls out "in hand fulls" and endorses hot flashes.  She adds her hairline appears to be receeding.  Has followed up with her ob/gyn and had hormone testing done.  Reports skipped periods with no bleeding, though she still experiences associated sx such as breast tenderness and mood changes.   She continues to take her anxiety medications PRN.    Prolapsed internal hemorrhoids, grade 3 She saw Dr. Hershell Lose for this and discussed the possibility of a hemorrhoidectomy in her future Is overall concerned about having this procedure due to the significant amount of recovery it would be and how much it may limit her ability to care for her children while recovering She is interested in another opinion   Past Medical History:  Diagnosis Date   Anxiety    Bell's palsy    OCD (obsessive compulsive disorder)    Thalassanemia 2021     Social History   Tobacco Use   Smoking status: Never   Smokeless tobacco: Never  Vaping Use   Vaping status: Never Used  Substance Use Topics   Alcohol use: No   Drug use: No    Past Surgical History:  Procedure Laterality Date   BUNIONECTOMY Right     Family History  Problem Relation Age of Onset   Thyroid  disease Mother    Kidney cancer Mother    Asthma Father    Irritable bowel syndrome Father    Dementia Father    Heart disease Sister    Celiac disease Sister    Diabetes Mellitus II Maternal Grandmother    Mental illness Maternal Grandfather    Glaucoma Paternal Grandfather    Celiac disease Son        tests high for it, not 100% sure he has it   Breast cancer Maternal Aunt    Colon  cancer Neg Hx    Throat cancer Neg Hx    Rectal cancer Neg Hx     Allergies  Allergen Reactions   Other Anaphylaxis    Coke and red mt. Dew, Throat closes up    Current Medications:   Current Outpatient Medications:    ALPRAZolam  (XANAX ) 0.25 MG tablet, Take 1-2 tablets (0.25-0.5 mg total) by mouth 2 (two) times daily as needed for anxiety. Or sleep, Disp: 60 tablet, Rfl: 1   hydrOXYzine  (ATARAX ) 10 MG tablet, Take 0.5-3 tablets (5-30 mg total) by mouth 3 (three) times daily as needed., Disp: 60 tablet, Rfl: 1   Multiple Vitamins-Minerals (HAIR/SKIN/NAILS/BIOTIN PO), Take by mouth., Disp: , Rfl:    Review of Systems:   Negative unless otherwise specified per HPI.  Vitals:   Vitals:   01/31/24 1107  BP: 112/70  Pulse: 61  Temp: 98.2 F (36.8 C)  TempSrc: Temporal  SpO2: 98%  Weight: 154 lb (69.9 kg)  Height: 5\' 2"  (1.575 m)     Body mass index is 28.17 kg/m.  Physical Exam:   Physical Exam Vitals and nursing note reviewed.  Constitutional:      General: She is not in acute distress.    Appearance: She is well-developed.  She is not ill-appearing or toxic-appearing.  Cardiovascular:     Rate and Rhythm: Normal rate and regular rhythm.     Pulses: Normal pulses.     Heart sounds: Normal heart sounds, S1 normal and S2 normal.  Pulmonary:     Effort: Pulmonary effort is normal.     Breath sounds: Normal breath sounds.  Skin:    General: Skin is warm and dry.  Neurological:     Mental Status: She is alert.     GCS: GCS eye subscore is 4. GCS verbal subscore is 5. GCS motor subscore is 6.  Psychiatric:        Speech: Speech normal.        Behavior: Behavior normal. Behavior is cooperative.     Assessment and Plan:   1. Hair loss (Primary) - TSH - T4, free - T3, free - IBC + Ferritin - CBC with Differential/Platelet  Update blood work to rule out organic cause of symptoms Consider referral to dermatology if worsens or persists  2. Prolapsed internal  hemorrhoids, grade 3 - Ambulatory referral to Gastroenterology   Will refer to [facility] to see if she is a candidate for banding or other less invasive options Continue to work on regular bowel health  I, Bernita Bristle, acting as a Neurosurgeon for Alexander Iba, [name]., have documented all relevant documentation on the behalf of Alexander Iba, [name], as directed by   while in the presence of Alexander Iba, [name].  I, Alexander Iba, [name], have reviewed all documentation for this visit. The documentation on 01/31/24 for the exam, diagnosis, procedures, and orders are all accurate and complete.  Alexander Iba, PA-C

## 2024-03-04 ENCOUNTER — Encounter: Payer: Self-pay | Admitting: Gastroenterology

## 2024-03-13 ENCOUNTER — Encounter: Payer: Self-pay | Admitting: Physician Assistant

## 2024-03-13 ENCOUNTER — Ambulatory Visit: Admitting: Physician Assistant

## 2024-03-13 DIAGNOSIS — G4700 Insomnia, unspecified: Secondary | ICD-10-CM

## 2024-03-13 DIAGNOSIS — F411 Generalized anxiety disorder: Secondary | ICD-10-CM | POA: Diagnosis not present

## 2024-03-13 DIAGNOSIS — F429 Obsessive-compulsive disorder, unspecified: Secondary | ICD-10-CM | POA: Diagnosis not present

## 2024-03-13 MED ORDER — ALPRAZOLAM 0.25 MG PO TABS: 0.2500 mg | ORAL_TABLET | Freq: Three times a day (TID) | ORAL | 1 refills | Status: AC | PRN

## 2024-03-13 MED ORDER — HYDROXYZINE HCL 10 MG PO TABS: 5.0000 mg | ORAL_TABLET | Freq: Three times a day (TID) | ORAL | 1 refills | Status: AC | PRN

## 2024-03-13 NOTE — Progress Notes (Signed)
 [facility]  Patient ID: Yolanda Kelly,  MRN: [id number]  PCP: Job Lukes, PA  Date of Evaluation: 03/13/2024 Time spent:20 minutes  Chief Complaint:  Chief Complaint   Anxiety; Insomnia; Follow-up    HISTORY/CURRENT STATUS: HPI  f/u anxiety  Sleeping better w/ taking Xanax .  It tends to help her go to sleep but not stay asleep.  She will sometimes wake up around 4 in the morning and unable to go back to sleep.  When she takes the hydroxyzine  and it helps her stay asleep but does not put her to sleep.  She has been more anxious lately, triggered by circumstances.  The Xanax  does help some but it's not quite as effective as it has been when she takes it during the day.  She never takes more than 1.  Not having full-blown panic attacks.  Still has obsessive thinking but is not worse and is manageable.  Mood is okay, able to enjoy things.  Energy and motivation are good for the most part.   No extreme sadness, tearfulness, or feelings of hopelessness. ADLs and personal hygiene are normal.  No changes in concentration, making decisions, or remembering things.  Appetite has not changed.  No delirium, mania, psychosis, or SI/HI.  Denies dizziness, syncope, seizures, numbness, tingling, tremor, tics, unsteady gait, slurred speech, confusion. Denies muscle or joint pain, stiffness, or dystonia.  Individual Medical History/ Review of Systems: Changes? :No    Past medications for mental health diagnoses include: Xanax , Unisom caused extreme , Sonata  worked for a while but then it quit.  Buspar  caused h/a and nausea, Trazodone  caused nightmares, Luvox  ?  Allergies: Other  Current Medications:  Current Outpatient Medications:    Multiple Vitamins-Minerals (HAIR/SKIN/NAILS/BIOTIN PO), Take by mouth., Disp: , Rfl:    ALPRAZolam  (XANAX ) 0.25 MG tablet, Take 1-2 tablets (0.25-0.5 mg total) by mouth 3 (three) times daily as needed for anxiety. Or sleep, Disp: 90 tablet, Rfl: 1    hydrOXYzine  (ATARAX ) 10 MG tablet, Take 0.5-3 tablets (5-30 mg total) by mouth 3 (three) times daily as needed., Disp: 90 tablet, Rfl: 1 Medication Side Effects: none  Family Medical/ Social History: Changes?  No  MENTAL HEALTH EXAM:  There were no vitals taken for this visit.There is no height or weight on file to calculate BMI.  General Appearance: Casual, Neat and Well Groomed  Eye Contact:  Good  Speech:  Clear and Coherent  Volume:  Normal  Mood:  Euthymic  Affect:  Congruent  Thought Process:  Goal Directed and Descriptions of Associations: Intact  Orientation:  Full (Time, Place, and Person)  Thought Content: Logical   Suicidal Thoughts:  No  Homicidal Thoughts:  No  Memory:  WNL  Judgement:  Good  Insight:  Good  Psychomotor Activity:  Normal  Concentration:  Concentration: Good and Attention Span: Good  Recall:  Good  Fund of Knowledge: Good  Language: Good  Assets:  Communication Skills Desire for Improvement Financial Resources/Insurance Housing Physical Health Resilience Transportation Vocational/Educational  ADL's:  Intact  Cognition: WNL  Prognosis:  Good   DIAGNOSES:    ICD-10-CM   1. Insomnia, unspecified type  G47.00     2. Obsessive-compulsive disorder, unspecified type  F42.9     3. Generalized anxiety disorder  F41.1      Receiving Psychotherapy: No In the past with Silvano Pacini, [facility]  RECOMMENDATIONS:  PDMP reviewed.  Xanax  filled 01/29/2024. I provided approximately 20 minutes of face to face time during this encounter, including  time spent before and after the visit in records review, medical decision making, counseling pertinent to today's visit, and charting.   We discussed the Xanax  and tolerance.  She has never taken it frequently enough that I would be concerned about that but she has needed it more lately so it could be a problem.  She is only taking 1 pill so I recommend taking 1-1/2 to 2 pills if she is really anxious during the  day.  She can take the same at bedtime if she needs to.  I recommend she take it with the hydroxyzine  since the hydroxyzine  helps her stay asleep.   Continue Xanax  0.25 mg, 1-2 p.o. twice daily as needed anxiety or sleep. Continue hydroxyzine  10 mg, 1/2-3 p.o. 3 times daily as needed anxiety. Return in 4 months.  Verneita Cooks, PA-C

## 2024-05-02 ENCOUNTER — Ambulatory Visit: Admitting: Gastroenterology

## 2024-07-16 ENCOUNTER — Ambulatory Visit (INDEPENDENT_AMBULATORY_CARE_PROVIDER_SITE_OTHER): Admitting: Physician Assistant

## 2024-07-16 ENCOUNTER — Encounter: Payer: Self-pay | Admitting: Gastroenterology

## 2024-07-16 ENCOUNTER — Ambulatory Visit (INDEPENDENT_AMBULATORY_CARE_PROVIDER_SITE_OTHER): Admitting: Gastroenterology

## 2024-07-16 VITALS — BP 100/60 | HR 82 | Ht 62.0 in | Wt 150.6 lb

## 2024-07-16 DIAGNOSIS — D569 Thalassemia, unspecified: Secondary | ICD-10-CM | POA: Diagnosis not present

## 2024-07-16 DIAGNOSIS — K648 Other hemorrhoids: Secondary | ICD-10-CM

## 2024-07-16 DIAGNOSIS — K649 Unspecified hemorrhoids: Secondary | ICD-10-CM

## 2024-07-16 DIAGNOSIS — K644 Residual hemorrhoidal skin tags: Secondary | ICD-10-CM | POA: Diagnosis not present

## 2024-07-16 NOTE — Progress Notes (Signed)
 ____________________________________________________________  Attending physician addendum:  Thank you for sending this case to me. I have reviewed the entire note and agree with the plan.  She can be scheduled for banding with me when a convenient date is available without sigmoidoscopy/colonoscopy first.  Victory Brand, MD  ____________________________________________________________

## 2024-07-16 NOTE — Progress Notes (Signed)
 Yolanda Kelly [id number] May 19, 1986   Chief Complaint: Hemorrhoids  Referring Provider: Job Lukes, PA Primary GI MD: Dr. Legrand  HPI: Yolanda Kelly is a 38 y.o. female with past medical history of anxiety, OCD, Bell's palsy, anemia, thalassemia who presents today for a complaint of hemorrhoids.    Last seen in office 05/09/2017 for complaint of diarrhea and abdominal cramping.  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  Had extensive neurologic workup that was unrevealing including lab work and MRI of the brain.  During that time developed intermittent cramps and diarrhea.  Had tested negative for celiac sprue.  Endoscopic evaluation thought to be low yield at that time.  She was provided reassurance and advised to follow-up as needed.  Seen by PCP March 2025 at which time endorsed 8 years of rectal pain, no prior colonoscopy.  Had been seen at [facility] 06/18/2014 around onset of symptoms and had defecography done with finding of mild laxity of the middle and posterior compartments with a small anterior rectocele and pubic coccygeal dysfunction during squeeze. Barium enema showed a redundant sigmoid colon with no stricture or obstruction.  Was seen by Mliss Ng, MD on 07/24/2014 who advised conservative care with topical zinc oxide as needed for symptoms.  Treatment alternatives were discussed.  Resection would be similar to hemorrhoidectomy and not indicated with her daily care of a 44-month-old baby and questionable future pregnancies at that time.  She was referred by PCP to Colorectal surgery.  Seen for initial consult by [facility] 12/26/2023.  On exam noted to have right anterior external hemorrhoid tag that connects with enlarged grade 3 hemorrhoid.  On digital and anoscopic rectal exam found to have grade 2 internal hemorrhoids, right anterior seemed  mildly enlarged and most likely the culprit of the prolapse.  Outpatient surgery was recommended.  She was unsure whether she wanted to proceed over the summer versus winter break.  At follow-up with PCP was concerned about having this surgery due to significant amount of recovery and how it might limit her ability to care for her children.  Was interested in another option.  Referred for consideration of hemorrhoid banding.   Discussed the use of AI scribe software for clinical note transcription with the patient, who gave verbal consent to proceed.  History of Present Illness Yolanda Kelly is a 38 year old female who presents for a second opinion regarding hemorrhoid management. She was referred by her primary care physician for a second opinion after a previous consultation with Dr. Sheldon at [facility].  Hemorrhoidal symptoms - Began after childbirth and has progressively worsened over the past year  - Has prolapsing hemorrhoids that previously would reduce spontaneously, now requires manual reduction - Prolapse is uncomfortable but not painful - Discomfort is most pronounced when sitting - Recently has noticed that hemorrhoid does not remain reduced as long after manual reduction - Occasional minor rectal bleeding, typically as a small amount on toilet paper - No significant bleeding reported  Bowel habits and gastrointestinal symptoms - Regular bowel habits maintained with daily fiber intake - Occasional constipation, generally well managed - No abdominal pain, nausea, or vomiting  Hematologic history - Thalassemia and anemia present - No transfusion requirements  Colorectal cancer screening and family history - Partial colon imaging performed in the past to rule out malignancy - No family history of colon  cancer      Previous GI Procedures/Imaging      Past Medical History:  Diagnosis Date   Anxiety    Bell's palsy    OCD (obsessive compulsive disorder)     Thalassanemia 2021    Past Surgical History:  Procedure Laterality Date   BUNIONECTOMY Right     Current Outpatient Medications  Medication Sig Dispense Refill   ALPRAZolam  (XANAX ) 0.25 MG tablet Take 1-2 tablets (0.25-0.5 mg total) by mouth 3 (three) times daily as needed for anxiety. Or sleep 90 tablet 1   hydrOXYzine  (ATARAX ) 10 MG tablet Take 0.5-3 tablets (5-30 mg total) by mouth 3 (three) times daily as needed. 90 tablet 1   Multiple Vitamins-Minerals (HAIR/SKIN/NAILS/BIOTIN PO) Take by mouth.     No current facility-administered medications for this visit.    Allergies as of 07/16/2024 - Review Complete 07/16/2024  Allergen Reaction Noted   Other Anaphylaxis 07/01/2013    Family History  Problem Relation Age of Onset   Thyroid  disease Mother    Kidney cancer Mother    Heart defect Mother        congenital   Asthma Father    Irritable bowel syndrome Father    Dementia Father    Heart disease Sister    Celiac disease Sister    Diabetes Mellitus II Maternal Grandmother    Mental illness Maternal Grandfather    Glaucoma Paternal Grandfather    Celiac disease Son        tests high for it, not 100% sure he has it   Breast cancer Maternal Aunt    Colon cancer Neg Hx    Throat cancer Neg Hx    Rectal cancer Neg Hx    Esophageal cancer Neg Hx    Stomach cancer Neg Hx     Social History   Tobacco Use   Smoking status: Never   Smokeless tobacco: Never  Vaping Use   Vaping status: Never Used  Substance Use Topics   Alcohol use: No   Drug use: No     Review of Systems:    Constitutional: No fever, chills Cardiovascular: No chest pain Respiratory: No SOB  Gastrointestinal: See HPI and otherwise negative   Physical Exam:  Vital signs: BP 100/60   Pulse 82   Ht 5' 2 (1.575 m)   Wt 150 lb 9.6 oz (68.3 kg)   LMP 06/13/2024 (Approximate)   SpO2 99%   BMI 27.55 kg/m   Constitutional: Pleasant, well-appearing female in NAD, alert and cooperative Head:   Normocephalic and atraumatic.  Eyes: No scleral icterus.  Respiratory: Respirations even and unlabored. Lungs clear to auscultation bilaterally.  No wheezes, crackles, or rhonchi.  Cardiovascular:  Regular rate and rhythm. No murmurs. No peripheral edema. Gastrointestinal:  Soft, nondistended, nontender. No rebound or guarding. Normal bowel sounds. No appreciable masses or hepatomegaly. Rectal: External hemorrhoids/perianal skin tags, no fissures seen.  Internal hemorrhoids seen on anoscopy, not currently prolapsed.  No visible bleeding.  Chaperone present for exam. Neurologic:  Alert and oriented x4;  grossly normal neurologically.  Skin:   Dry and intact without significant lesions or rashes. Psychiatric: Oriented to person, place and time. Demonstrates good judgement and reason without abnormal affect or behaviors.   RELEVANT LABS AND IMAGING: CBC    Component Value Date/Time   WBC 8.3 01/31/2024 1138   RBC 5.40 (H) 01/31/2024 1138   HGB 11.9 (L) 01/31/2024 1138   HCT 37.7 01/31/2024 1138   PLT 357.0 01/31/2024 1138  MCV 69.8 Repeated and verified X2. (L) 01/31/2024 1138   MCH 22.6 (L) 04/14/2020 1411   MCHC 31.6 01/31/2024 1138   RDW 14.9 01/31/2024 1138   LYMPHSABS 2.2 01/31/2024 1138   MONOABS 0.5 01/31/2024 1138   EOSABS 0.1 01/31/2024 1138   BASOSABS 0.1 01/31/2024 1138    CMP     Component Value Date/Time   NA 137 05/22/2023 0947   K 4.0 05/22/2023 0947   CL 105 05/22/2023 0947   CO2 26 05/22/2023 0947   GLUCOSE 81 05/22/2023 0947   BUN 14 05/22/2023 0947   CREATININE 0.76 05/22/2023 0947   CREATININE 0.74 04/14/2020 1411   CALCIUM 9.7 05/22/2023 0947   PROT 7.7 05/22/2023 0947   ALBUMIN 4.2 05/22/2023 0947   AST 15 05/22/2023 0947   ALT 13 05/22/2023 0947   ALKPHOS 78 05/22/2023 0947   BILITOT 0.3 05/22/2023 0947   GFRNONAA >60 11/03/2017 1958   GFRAA >60 11/03/2017 1958     Assessment/Plan:    Assessment and Plan Assessment &  Plan Hemorrhoids Patient with longstanding history of hemorrhoids.  Previously thought to have rectal prolapse.  Had defecography and barium enema done in 2015. See HPI for more detail. Conservative management was recommended.   Recently referred to [facility] surgery and had initial consult in April at which time found to have internal hemorrhoids thought to be the culprit of intermittent prolapse and an outpatient surgery was recommended. Patient is hesitant to have surgery due to prolonged recovery time and is interested in alternative options. Internal hemorrhoids seen on exam which are not prolapsed currently.  States that she experiences prolapse intermittently which previously would reduce spontaneously but now requires manual reduction. Very occasionally has small amount of BRBPR.  I believe she is a candidate for hemorrhoid banding, will schedule appointment with Dr. Legrand.   - Plan for hemorrhoid banding. Currently no procedure dates available, 12/22 offered but patient will be out of town. Will plan to schedule when January dates open up. - Information given regarding procedure.  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    Camie Furbish, PA-C [facility] 07/16/2024, 3:51 PM  Patient Care Team: Job Lukes, [name] as PCP - General (Physician Assistant) [place], [facility] (Obstetrics and Gynecology) Janit Thresa HERO, DPM as Consulting Physician (Podiatry) [facility] (Dentistry) Skeet Juliene SAUNDERS, DO as Consulting Physician (Neurology)

## 2024-07-16 NOTE — Patient Instructions (Signed)
 _______________________________________________________  If your blood pressure at your visit was 140/90 or greater, please contact your primary care physician to follow up on this.  _______________________________________________________  If you are age 40 or older, your body mass index should be between 23-30. Your Body mass index is 27.55 kg/m. If this is out of the aforementioned range listed, please consider follow up with your Primary Care Provider.  If you are age 39 or younger, your body mass index should be between 19-25. Your Body mass index is 27.55 kg/m. If this is out of the aformentioned range listed, please consider follow up with your Primary Care Provider.   ________________________________________________________  The [facility] providers would like to encourage you to use MYCHART to communicate with providers for non-urgent requests or questions.  Due to long hold times on the telephone, sending your provider a message by [facility] may be a faster and more efficient way to get a response.  Please allow 48 business hours for a response.  Please remember that this is for non-urgent requests.  _______________________________________________________  [facility] is using a team-based approach to care.  Your team is made up of your doctor and two to three APPS. Our APPS (Nurse Practitioners and Physician Assistants) work with your physician to ensure care continuity for you. They are fully qualified to address your health concerns and develop a treatment plan. They communicate directly with your gastroenterologist to care for you. Seeing the Advanced Practice Practitioners on your physician's team can help you by facilitating care more promptly, often allowing for earlier appointments, access to diagnostic testing, procedures, and other specialty referrals.

## 2024-09-02 ENCOUNTER — Encounter: Admitting: Gastroenterology

## 2024-09-20 ENCOUNTER — Encounter: Payer: Self-pay | Admitting: Physician Assistant

## 2024-09-20 ENCOUNTER — Ambulatory Visit: Admitting: Physician Assistant

## 2024-09-20 DIAGNOSIS — G4700 Insomnia, unspecified: Secondary | ICD-10-CM | POA: Diagnosis not present

## 2024-09-20 DIAGNOSIS — F411 Generalized anxiety disorder: Secondary | ICD-10-CM

## 2024-09-20 DIAGNOSIS — F429 Obsessive-compulsive disorder, unspecified: Secondary | ICD-10-CM | POA: Diagnosis not present

## 2024-09-20 NOTE — Progress Notes (Signed)
 "     [facility]  Patient ID: Yolanda Kelly,  MRN: [id number]  PCP: Yolanda Lukes, PA  Date of Evaluation: 09/20/2024 Time spent:20 minutes  Chief Complaint:  Chief Complaint   Anxiety; Follow-up    HISTORY/CURRENT STATUS: HPI   For routine f/u  [name] is doing well for the most part. She's easily distracted. Thinks a lot of it is stress rxn. Still gets anxious but doesn't like to take meds.  Xanax  and hydroxyzine  help when needed.  Obsessions are worse sometimes than others. Mostly controlled though. Her Son is very high functioning autism and does well but it can still be stressful for her. Very busy between home and work.  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  Energy and motivation are good.  No extreme sadness, tearfulness, or feelings of hopelessness.  Sleeps ok.  ADLs and personal hygiene are normal.  No change in memory.   Appetite has not changed.  Weight is stable.   No mania, delirium, AH/VH.  No SI/HI.  Individual Medical History/ Review of Systems: Changes? :No    Past medications for mental health diagnoses include: Xanax , Unisom caused extreme , Sonata  worked for a while but then it quit.  Buspar  caused h/a and nausea, Trazodone  caused nightmares, Luvox  ?  Allergies: Other  Current Medications:  Current Outpatient Medications:    ALPRAZolam  (XANAX ) 0.25 MG tablet, Take 1-2 tablets (0.25-0.5 mg total) by mouth 3 (three) times daily as needed for anxiety. Or sleep, Disp: 90 tablet, Rfl: 1   hydrOXYzine  (ATARAX ) 10 MG tablet, Take 0.5-3 tablets (5-30 mg total) by mouth 3 (three) times daily as needed. (Patient not taking: Reported on 09/20/2024), Disp: 90 tablet, Rfl: 1   Multiple Vitamins-Minerals (HAIR/SKIN/NAILS/BIOTIN PO), Take by mouth. (Patient not taking: Reported on 09/20/2024), Disp: , Rfl:  Medication Side Effects: none  Family Medical/ Social History: Changes?  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  Is  finishing her degree in [employer] this semester. [name]  MENTAL HEALTH EXAM:  There were no vitals taken for this visit.There is no height or weight on file to calculate BMI.  General Appearance: Casual, Neat and Well Groomed  Eye Contact:  Good  Speech:  Clear and Coherent  Volume:  Normal  Mood:  Euthymic  Affect:  Congruent  Thought Process:  Goal Directed and Descriptions of Associations: Intact  Orientation:  Full (Time, Place, and Person)  Thought Content: Logical   Suicidal Thoughts:  No  Homicidal Thoughts:  No  Memory:  WNL  Judgement:  Good  Insight:  Good  Psychomotor Activity:  Normal  Concentration:  Concentration: Good and Attention Span: Good  Recall:  Good  Fund of Knowledge: Good  Language: Good  Assets:  Communication Skills Desire for Improvement Financial Resources/Insurance Housing Physical Health Resilience Social Support Transportation Vocational/Educational  ADL's:  Intact  Cognition: WNL  Prognosis:  Good   DIAGNOSES:    ICD-10-CM   1. Obsessive-compulsive disorder, unspecified type  F42.9     2. Generalized anxiety disorder  F41.1     3. Insomnia, unspecified type  G47.00      Receiving Psychotherapy: No   RECOMMENDATIONS:  PDMP reviewed.  Xanax  filled 03/13/2024. I provided approximately  20 minutes of face to face time during this encounter, including time spent before and after the visit in records review, medical decision making, counseling pertinent to today's visit, and charting.   Briefly discussed  adding a med to prevent anxiety, such as Buspar  or an SSRI but doesn't like to take meds and feels like the current txment is effective.  No changes at this time.   Continue Xanax  0.25 mg, 1-2 p.o. twice daily as needed anxiety or sleep. Continue hydroxyzine  10 mg, 1/2-3 p.o. 3 times daily as needed anxiety. Return in 6 months.  Yolanda Cooks, PA-C  "

## 2024-10-17 ENCOUNTER — Encounter: Payer: Self-pay | Admitting: Dermatology

## 2024-10-17 ENCOUNTER — Ambulatory Visit: Admitting: Dermatology

## 2024-10-17 DIAGNOSIS — Z79899 Other long term (current) drug therapy: Secondary | ICD-10-CM

## 2024-10-17 DIAGNOSIS — L649 Androgenic alopecia, unspecified: Secondary | ICD-10-CM

## 2024-10-17 MED ORDER — MINOXIDIL 2.5 MG PO TABS: 1.2500 mg | ORAL_TABLET | Freq: Every day | ORAL | 0 refills | Status: AC

## 2024-10-17 NOTE — Progress Notes (Signed)
" ° °  New Patient Visit  Patient (and/or pt guardian) consented to the use of AI-assisted tools for note generation.    Subjective  Yolanda Kelly is a 39 y.o. female who presents for the following: hair loss  Located at the scalp that she would like to have examined. Patient reports the areas have been there for a couple of years and in the past year has had excessive shedding  She reports the areas are not bothersome. She states that the areas are more around the frontal hair line and hair line around the neck Patient reports she has not previously been treated for these areas. Patient reports she is taking a multivitamin for hair skin and nails. Patient does not take collagen supplements Patient reports using loreal purple shampoo  The following portions of the chart were reviewed this encounter and updated as appropriate: medications, allergies, medical history  Review of Systems:  No other skin or systemic complaints except as noted in HPI or Assessment and Plan.  Objective  Well appearing patient in no apparent distress; mood and affect are within normal limits.  A focused examination was performed of the following areas: scalp  Relevant exam findings are noted in the Assessment and Plan.   Assessment & Plan  ANDROGENETIC ALOPECIA (FEMALE PATTERN HAIR LOSS) Exam: Diffuse thinning of the crown and widening of the midline part with retention of the frontal hairline  Patient Education Discussed During Visit: Female Androgenic Alopecia is a chronic condition related to genetics and/or hormonal changes.  In women androgenetic alopecia is commonly associated with menopause but may occur any time after puberty.  It causes hair thinning primarily on the crown with widening of the part and temporal hairline recession.  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  Hormone levels are normal. Condition is treatable but not curable, requiring ongoing treatment to maintain results. Oral minoxidil  preferred due to ease of use and faster results compared to topical application. Discussed potential side effects of oral minoxidil , including lightheadedness, dizziness, and unwanted hair growth in other areas, though these are rare. Oral minoxidil  does not affect blood pressure at the prescribed dose. Insurance covers oral minoxidil , unlike topical formulations.  - Prescribed oral minoxidil  1.25 mg daily, starting with half a 2.5 mg tablet. - Instructed to monitor for symptoms of lightheadedness, dizziness, or chest fluttering and to stop medication and contact provider if these occur. - Scheduled follow-up appointment in May to assess progress and adjust dosage if necessary. - Advised on the use of sulfate-free, hydrating shampoos and conditioners. - Discussed the option of adding collagen supplements to potentiate results, though not necessary for efficacy.   Long term medication management.  Patient is using long term (months to years) prescription medication  to control their dermatologic condition.  These medications require periodic monitoring to evaluate for efficacy and side effects and may require periodic laboratory monitoring.     No follow-ups on file.  LILLETTE Lyle Cords, am acting as a neurosurgeon for [employer], DO .   Documentation: I have reviewed the above documentation for accuracy and completeness, and I agree with the above.  Delon Lenis, DO     "

## 2024-10-17 NOTE — Patient Instructions (Addendum)
 VISIT SUMMARY:  During your visit, we discussed your concerns about hair thinning at the front of your scalp. We reviewed your medical history, including your family history of thyroid  issues and your history of thalassemia. Your thyroid  function tests are normal, and you have not been diagnosed with any thyroid  disorder.  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 It is treatable but not curable, and ongoing treatment is necessary to maintain results.   We have prescribed oral minoxidil  1.25 mg daily, starting with half a 2.5 mg tablet, as it is easier to use and provides faster results compared to topical application.   Please monitor for any symptoms of lightheadedness, dizziness, or chest fluttering, and stop the medication and contact us  if these occur. We will follow up in May to assess your progress and adjust the dosage if necessary.   Additionally, we recommend using sulfate-free, hydrating shampoos and conditioners, and you may consider adding collagen supplements to enhance results, though they are not essential.  INSTRUCTIONS:  Please take oral minoxidil  1.25 mg daily, starting with half a 2.5 mg tablet. Monitor for any symptoms of lightheadedness, dizziness, or chest fluttering, and stop the medication and contact us  if these occur. We will follow up in May to assess your progress and adjust the dosage if necessary.    Doses of oral minoxidil  for hair loss are considered low dose. This is because the doses used for hair loss are much lower than the doses which are used for conditions such as high blood pressure (hypertension). The doses used for hypertension are 10-40mg  per day.  Side effects are uncommon at the low doses (up to 2.5 mg/day) used to treat hair loss. Potential side effects, more commonly seen at higher doses,  include: Increase in hair growth (hypertrichosis) elsewhere on face and body Temporary hair shedding upon starting medication which may last up to 4 weeks Ankle swelling, fluid retention, rapid weight gain more than 5 pounds Low blood pressure and feeling lightheaded or dizzy when standing up quickly Fast or irregular heartbeat Headaches  Important Information  Due to recent changes in healthcare laws, you may see results of your pathology and/or laboratory studies on MyChart before the doctors have had a chance to review them. We understand that in some cases there may be results that are confusing or concerning to you. Please understand that not all results are received at the same time and often the doctors may need to interpret multiple results in order to provide you with the best plan of care or course of treatment. Therefore, we ask that you please give us  2 business days to thoroughly review all your results before contacting the office for clarification. Should we see a critical lab result, you will be contacted sooner.   If You Need Anything After Your Visit  If you have any questions or concerns for your doctor, please call our main line at [phone] If no one answers, please leave a voicemail as directed and we will return your call as soon as possible. Messages left after 4 pm will be answered the following business day.   You may also send us  a message via MyChart. We typically respond to MyChart messages within 1-2 business days.  For prescription refills, please ask your pharmacy to contact our office. Our fax number is [phone].  If you have an urgent  issue when the clinic is closed that cannot wait until the next business day, you can page your doctor at the number below.    Please note that while we do our best to be available for urgent issues outside of office hours, we are not available 24/7.   If you have an urgent issue and are unable to reach us , you may  choose to seek medical care at your doctor's office, retail clinic, urgent care center, or emergency room.  If you have a medical emergency, please immediately call 911 or go to the emergency department. In the event of inclement weather, please call our main line at [phone] for an update on the status of any delays or closures.  Dermatology Medication Tips: Please keep the boxes that topical medications come in in order to help keep track of the instructions about where and how to use these. Pharmacies typically print the medication instructions only on the boxes and not directly on the medication tubes.   If your medication is too expensive, please contact our office at [phone] or send us  a message through MyChart.   We are unable to tell what your co-pay for medications will be in advance as this is different depending on your insurance coverage. However, we may be able to find a substitute medication at lower cost or fill out paperwork to get insurance to cover a needed medication.   If a prior authorization is required to get your medication covered by your insurance company, please allow us  1-2 business days to complete this process.  Drug prices often vary depending on where the prescription is filled and some pharmacies may offer cheaper prices.  The website www.goodrx.com contains coupons for medications through different pharmacies. The prices here do not account for what the cost may be with help from insurance (it may be cheaper with your insurance), but the website can give you the price if you did not use any insurance.  - You can print the associated coupon and take it with your prescription to the pharmacy.  - You may also stop by our office during regular business hours and pick up a GoodRx coupon card.  - If you need your prescription sent electronically to a different pharmacy, notify our office through [facility] or by phone at [phone]

## 2025-01-27 ENCOUNTER — Ambulatory Visit: Admitting: Dermatology

## 2025-03-21 ENCOUNTER — Ambulatory Visit: Admitting: Physician Assistant
